# Patient Record
Sex: Male | Born: 1955 | Race: Black or African American | Hispanic: No | Smoking: Former smoker
Health system: Southern US, Community
[De-identification: ages and names within clinical notes are randomized; demographics above are authoritative.]

## PROBLEM LIST (undated history)

## (undated) ENCOUNTER — Emergency Department (HOSPITAL_COMMUNITY): Payer: BC Managed Care – PPO

## (undated) DIAGNOSIS — E785 Hyperlipidemia, unspecified: Secondary | ICD-10-CM

## (undated) DIAGNOSIS — R200 Anesthesia of skin: Secondary | ICD-10-CM

## (undated) DIAGNOSIS — M549 Dorsalgia, unspecified: Secondary | ICD-10-CM

## (undated) DIAGNOSIS — Z8739 Personal history of other diseases of the musculoskeletal system and connective tissue: Secondary | ICD-10-CM

## (undated) DIAGNOSIS — Z8709 Personal history of other diseases of the respiratory system: Secondary | ICD-10-CM

## (undated) DIAGNOSIS — G8929 Other chronic pain: Secondary | ICD-10-CM

## (undated) DIAGNOSIS — J302 Other seasonal allergic rhinitis: Secondary | ICD-10-CM

## (undated) DIAGNOSIS — M2550 Pain in unspecified joint: Secondary | ICD-10-CM

## (undated) HISTORY — PX: HERNIA REPAIR: SHX51

## (undated) HISTORY — PX: NECK SURGERY: SHX720

## (undated) HISTORY — DX: Hyperlipidemia, unspecified: E78.5

---

## 2004-09-03 ENCOUNTER — Emergency Department (HOSPITAL_COMMUNITY): Admission: EM | Admit: 2004-09-03 | Discharge: 2004-09-03 | Payer: Self-pay | Admitting: Emergency Medicine

## 2005-12-25 ENCOUNTER — Ambulatory Visit (HOSPITAL_COMMUNITY): Admission: RE | Admit: 2005-12-25 | Discharge: 2005-12-25 | Payer: Self-pay | Admitting: Family Medicine

## 2008-05-31 ENCOUNTER — Ambulatory Visit (HOSPITAL_COMMUNITY): Admission: RE | Admit: 2008-05-31 | Discharge: 2008-05-31 | Payer: Self-pay | Admitting: Preventative Medicine

## 2009-03-06 ENCOUNTER — Emergency Department (HOSPITAL_COMMUNITY): Admission: EM | Admit: 2009-03-06 | Discharge: 2009-03-06 | Payer: Self-pay | Admitting: Emergency Medicine

## 2010-09-10 ENCOUNTER — Emergency Department (HOSPITAL_COMMUNITY)
Admission: EM | Admit: 2010-09-10 | Discharge: 2010-09-10 | Payer: Self-pay | Source: Home / Self Care | Admitting: Emergency Medicine

## 2011-01-12 LAB — COMPREHENSIVE METABOLIC PANEL
ALT: 17 U/L (ref 0–53)
AST: 19 U/L (ref 0–37)
Calcium: 9.5 mg/dL (ref 8.4–10.5)
Creatinine, Ser: 1.32 mg/dL — ABNORMAL HIGH (ref 0.4–1.5)
GFR calc Af Amer: 51 mL/min — ABNORMAL LOW (ref >60)
GFR calc non Af Amer: 42 mL/min — ABNORMAL LOW (ref >60)
Potassium: 4.1 mEq/L (ref 3.5–5.1)
Sodium: 136 mEq/L (ref 135–145)

## 2011-01-12 LAB — DIFFERENTIAL
Basophils Absolute: 0 10*3/uL (ref 0.0–0.1)
Basophils Relative: 0 % (ref 0–1)
Eosinophils Relative: 1 % (ref 0–5)
Monocytes Absolute: 1.2 10*3/uL — ABNORMAL HIGH (ref 0.1–1.0)

## 2011-01-12 LAB — CBC
MCHC: 33.4 g/dL (ref 30.0–36.0)
Platelets: 241 10*3/uL (ref 150–400)
RDW: 14.8 % (ref 11.5–15.5)
WBC: 11.4 10*3/uL — ABNORMAL HIGH (ref 4.0–10.5)

## 2011-01-12 LAB — LIPASE, BLOOD: Lipase: 23 U/L (ref 11–59)

## 2012-03-21 ENCOUNTER — Emergency Department (HOSPITAL_COMMUNITY): Payer: BC Managed Care – PPO

## 2012-03-21 ENCOUNTER — Emergency Department (HOSPITAL_COMMUNITY)
Admission: EM | Admit: 2012-03-21 | Discharge: 2012-03-21 | Disposition: A | Discharge status: Home / Self Care | Payer: BC Managed Care – PPO | Attending: Emergency Medicine | Admitting: Emergency Medicine

## 2012-03-21 ENCOUNTER — Encounter (HOSPITAL_COMMUNITY): Payer: Self-pay | Admitting: *Deleted

## 2012-03-21 DIAGNOSIS — R109 Unspecified abdominal pain: Secondary | ICD-10-CM

## 2012-03-21 DIAGNOSIS — R112 Nausea with vomiting, unspecified: Secondary | ICD-10-CM | POA: Insufficient documentation

## 2012-03-21 LAB — DIFFERENTIAL
Basophils Absolute: 0 10*3/uL (ref 0.0–0.1)
Lymphs Abs: 2.1 10*3/uL (ref 0.7–4.0)
Neutro Abs: 6.3 10*3/uL (ref 1.7–7.7)
Neutrophils Relative %: 67 % (ref 43–77)

## 2012-03-21 LAB — COMPREHENSIVE METABOLIC PANEL
ALT: 17 U/L (ref 0–53)
AST: 17 U/L (ref 0–37)
CO2: 27 mEq/L (ref 19–32)
Calcium: 9.5 mg/dL (ref 8.4–10.5)
Creatinine, Ser: 1.24 mg/dL (ref 0.50–1.35)
GFR calc Af Amer: 73 mL/min — ABNORMAL LOW (ref >90)
GFR calc non Af Amer: 63 mL/min — ABNORMAL LOW (ref >90)
Potassium: 4.3 mEq/L (ref 3.5–5.1)
Total Protein: 7.8 g/dL (ref 6.0–8.3)

## 2012-03-21 LAB — CBC
HCT: 40.9 % (ref 39.0–52.0)
Hemoglobin: 12.7 g/dL — ABNORMAL LOW (ref 13.0–17.0)
MCH: 25.5 pg — ABNORMAL LOW (ref 26.0–34.0)
MCHC: 31.1 g/dL (ref 30.0–36.0)
MCV: 82.1 fL (ref 78.0–100.0)
Platelets: 318 10*3/uL (ref 150–400)
RDW: 15.4 % (ref 11.5–15.5)
WBC: 9.3 10*3/uL (ref 4.0–10.5)

## 2012-03-21 MED ORDER — FAMOTIDINE IN NACL 20-0.9 MG/50ML-% IV SOLN
20.0000 mg | Freq: Once | INTRAVENOUS | Status: AC
  Administered 2012-03-21: 20 mg via INTRAVENOUS
  Filled 2012-03-21: qty 50

## 2012-03-21 MED ORDER — ONDANSETRON HCL 4 MG/2ML IJ SOLN
4.0000 mg | INTRAMUSCULAR | Status: DC | PRN
  Administered 2012-03-21: 4 mg via INTRAVENOUS
  Filled 2012-03-21: qty 2

## 2012-03-21 MED ORDER — SODIUM CHLORIDE 0.9 % IV SOLN
INTRAVENOUS | Status: DC
  Administered 2012-03-21: 12:00:00 via INTRAVENOUS

## 2012-03-21 MED ORDER — SODIUM CHLORIDE 0.9 % IV BOLUS (SEPSIS)
500.0000 mL | Freq: Once | INTRAVENOUS | Status: AC
  Administered 2012-03-21: 500 mL via INTRAVENOUS

## 2012-03-21 MED ORDER — ONDANSETRON HCL 4 MG PO TABS: 4.0000 mg | ORAL_TABLET | Freq: Three times a day (TID) | ORAL | Status: AC | PRN

## 2012-03-21 NOTE — ED Provider Notes (Signed)
History  This chart was scribed for Laray Anger, DO by Bennett Scrape. This patient was seen in room APA12/APA12 and the patient's care was started at 11:08AM.  CSN: [id number]  Arrival date & time 03/21/12  1054   First MD Initiated Contact with Patient 03/21/12 1108      Chief Complaint  Patient presents with  . Abdominal Pain    The history is provided by the patient. No language interpreter was used.   Jimmy Hall is a 56 y.o. male who presents to the Emergency Department complaining of gradual onset and persistence of constant upper abd "pain" for the past 4 days.  Has been associated with nausea and vomiting.  He states that he originally thought he was constipated and took laxatives yesterday with no improvement. Pt denies sick contacts with similar symptoms. Denies fevers, no rash, no CP/SOB, no back pain, no diarrhea, no black or blood in stools or emesis.    No current PCP.  History reviewed. No pertinent past medical history.  Past Surgical History  Procedure Date  . Hernia repair (umbilical) 1998    History  Substance Use Topics  . Smoking status: Never Smoker   . Smokeless tobacco: Not on file  . Alcohol Use: No    Review of Systems ROS: Statement: All systems negative except as marked or noted in the HPI; Constitutional: Negative for fever and chills. ; ; Eyes: Negative for eye pain, redness and discharge. ; ; ENMT: Negative for ear pain, hoarseness, nasal congestion, sinus pressure and sore throat. ; ; Cardiovascular: Negative for chest pain, palpitations, diaphoresis, dyspnea and peripheral edema. ; ; Respiratory: Negative for cough, wheezing and stridor. ; ; Gastrointestinal: +N/V, upper abd pain. Negative for diarrhea, blood in stool, hematemesis, jaundice and rectal bleeding. . ; ; Genitourinary: Negative for dysuria, flank pain and hematuria. ; ; Musculoskeletal: Negative for back pain and neck pain. Negative for swelling and trauma.; ; Skin: Negative  for pruritus, rash, abrasions, blisters, bruising and skin lesion.; ; Neuro: Negative for headache, lightheadedness and neck stiffness. Negative for weakness, altered level of consciousness , altered mental status, extremity weakness, paresthesias, involuntary movement, seizure and syncope.     Allergies  Review of patient's allergies indicates no known allergies.  Home Medications  No current outpatient prescriptions on file.  Triage Vitals: BP 118/74  Pulse 63  Temp 98.8 F (37.1 C) (Oral)  Resp 18  Ht 5\' 10"  (1.778 m)  Wt 242 lb (109.77 kg)  BMI 34.72 kg/m2  SpO2 100%  Physical Exam 1120: Physical examination:  Nursing notes reviewed; Vital signs and O2 SAT reviewed;  Constitutional: Well developed, Well nourished, In no acute distress; Head:  Normocephalic, atraumatic; Eyes: EOMI, PERRL, No scleral icterus; ENMT: Mouth and pharynx normal, Mucous membranes dry; Neck: Supple, Full range of motion, No lymphadenopathy; Cardiovascular: Regular rate and rhythm, No murmur, rub, or gallop; Respiratory: Breath sounds clear & equal bilaterally, No rales, rhonchi, wheezes.  Speaking full sentences with ease, Normal respiratory effort/excursion; Chest: Nontender, Movement normal; Abdomen: Soft, +TTP RUQ, mid-epigastric, LUQ areas, no rebound or guarding, no palp hernias, +well healed surgical scar proximal to umbilicus is NT to palp without overlying erythema or ecchymosis. Nondistended, Normal bowel sounds; Genitourinary: No CVA tenderness; Extremities: Pulses normal, No tenderness, No edema, No calf edema or asymmetry.; Neuro: AA&Ox3, Major CN grossly intact.  Speech clear. No gross focal motor or sensory deficits in extremities.; Skin: Color normal, Warm, Dry.   ED Course  Procedures  MDM  MDM Reviewed: nursing note and vitals Reviewed previous: ECG Interpretation: labs, x-ray, ultrasound and ECG      Date: 03/21/2012  Rate: 67  Rhythm: normal sinus rhythm  QRS Axis: normal   Intervals: PR prolonged  ST/T Wave abnormalities: normal  Conduction Disutrbances:first-degree A-V block   Narrative Interpretation:   Old EKG Reviewed: none available.  Results for orders placed during the hospital encounter of 03/21/12  CBC      Component Value Range   WBC 9.3  4.0 - 10.5 K/uL   RBC 4.98  4.22 - 5.81 MIL/uL   Hemoglobin 12.7 (*) 13.0 - 17.0 g/dL   HCT 82.9  56.2 - 13.0 %   MCV 82.1  78.0 - 100.0 fL   MCH 25.5 (*) 26.0 - 34.0 pg   MCHC 31.1  30.0 - 36.0 g/dL   RDW 86.5  78.4 - 69.6 %   Platelets 318  150 - 400 K/uL  DIFFERENTIAL      Component Value Range   Neutrophils Relative 67  43 - 77 %   Neutro Abs 6.3  1.7 - 7.7 K/uL   Lymphocytes Relative 22  12 - 46 %   Lymphs Abs 2.1  0.7 - 4.0 K/uL   Monocytes Relative 9  3 - 12 %   Monocytes Absolute 0.9  0.1 - 1.0 K/uL   Eosinophils Relative 1  0 - 5 %   Eosinophils Absolute 0.1  0.0 - 0.7 K/uL   Basophils Relative 0  0 - 1 %   Basophils Absolute 0.0  0.0 - 0.1 K/uL  COMPREHENSIVE METABOLIC PANEL      Component Value Range   Sodium 135  135 - 145 mEq/L   Potassium 4.3  3.5 - 5.1 mEq/L   Chloride 101  96 - 112 mEq/L   CO2 27  19 - 32 mEq/L   Glucose, Bld 107 (*) 70 - 99 mg/dL   BUN 11  6 - 23 mg/dL   Creatinine, Ser 2.95  0.50 - 1.35 mg/dL   Calcium 9.5  8.4 - 28.4 mg/dL   Total Protein 7.8  6.0 - 8.3 g/dL   Albumin 3.6  3.5 - 5.2 g/dL   AST 17  0 - 37 U/L   ALT 17  0 - 53 U/L   Alkaline Phosphatase 60  39 - 117 U/L   Total Bilirubin 0.3  0.3 - 1.2 mg/dL   GFR calc non Af Amer 63 (*) >90 mL/min   GFR calc Af Amer 73 (*) >90 mL/min  LIPASE, BLOOD      Component Value Range   Lipase 25  11 - 59 U/L  TROPONIN I      Component Value Range   Troponin I <0.30  <0.30 ng/mL   US Abdomen Complete 03/21/2012  *RADIOLOGY REPORT*  Clinical Data:  Abdominal pain, nausea and vomiting.  COMPLETE ABDOMINAL ULTRASOUND  Comparison:  CT dated 03/06/2009.  Findings:  Gallbladder:  No gallstones, gallbladder wall  thickening, or pericholecystic fluid.  Common bile duct:  Normal in caliber, measuring 3.8 mm in diameter proximally.  Liver:  No focal lesion identified.  Within normal limits in parenchymal echogenicity.  IVC:  Normal proximally.  Pancreas:  Poorly visualized.  The visualized portions are unremarkable.  Spleen:  Normal, measuring 5.7 cm in length.  Right Kidney:  Normal, measuring 10.2 cm in length.  Left Kidney:  Small cyst.  Otherwise, normal, measuring 10.9 cm in length.  The small left  renal calculus seen on 03/06/2009 is not definitely identified.  Abdominal aorta:  No aneurysm identified.  IMPRESSION: No acute abnormality.  Original Report Authenticated By: Darrol Angel, M.D.   Dg Abd Acute W/chest 03/21/2012  *RADIOLOGY REPORT*  Clinical Data: Abdominal pain, nausea, vomiting, diarrhea  ACUTE ABDOMEN SERIES (ABDOMEN 2 VIEW & CHEST 1 VIEW)  Comparison: 03/06/2009  Findings: Upper-normal size of cardiac silhouette. Mediastinal contours and pulmonary vascularity normal. Lungs clear. Normal bowel gas pattern. Stable left upper quadrant calcification, shown to be a calcified pulmonary granuloma at the posterior left sulcus on a prior CT. No bowel dilatation, bowel wall thickening, or free intraperitoneal air. No acute osseous findings. End plate spur formation lumbar spine.  IMPRESSION: No acute abnormalities.  Original Report Authenticated By: Lollie Marrow, M.D.      3:34 PM:  VS remain stable.  No N/V or stooling while in the ED.  Has tol PO well.  No acute finding on work up today, will tx symptomatically.  Wants to go home now.  Dx testing d/w pt and family.  Questions answered.  Verb understanding, agreeable to d/c home with outpt f/u.      I personally performed the services described in this documentation, which was scribed in my presence. The recorded information has been reviewed and considered. [place] Karl Allison Quarry, DO 03/22/12 1156

## 2012-03-21 NOTE — ED Notes (Signed)
Denies nausea  

## 2012-03-21 NOTE — ED Notes (Signed)
Pt tolerating fluids well.  Denies nausea.

## 2012-03-21 NOTE — ED Notes (Signed)
Given sprite with ice per edp orders.  Sipping with nad.

## 2012-03-21 NOTE — Discharge Instructions (Signed)
RESOURCE GUIDE  Chronic Pain Problems: Contact [facility]  [phone] Patients need to be referred by their primary care doctor.  Insufficient Money for Medicine: Contact United Way:  call "211" or [facility] [phone].  No Primary Care Doctor: - Call [facility]  [phone] - can help you locate a primary care doctor that  accepts your insurance, provides certain services, etc. - Physician Referral Service- 1-800-533-3463  Agencies that provide inexpensive medical care: - [facility]  [phone] - [facility]  [phone] - [facility]  [phone] - [facility] Clinic  [phone] - Planned Parenthood  [phone] - [facility]  [phone]  Medicaid-accepting [place] Providers: - [facility]- [address]  [phone], Mon-Fri 9am-7pm, Sat 9am-1pm - [facility]- [address], [address]  [phone] - [facility]- [address], [address]  [phone] - [facility]- [address] [place] [address]  [phone] - Veita Bland- [address], [address], [phone]  Only accepts [place] Access Medicaid patients after they have their name  applied to their card  Self Pay (no insurance) in [place]: - Sickle Cell Patients: Dr Eric Dean, [facility]  [address], [phone] - [facility]- [address]  [phone]       -     [facility] [place]- [address]       -     [facility]- see information above (Speak to Pam H if you do not have insurance)       -  [facility]- [address], [phone]       -  [facility] [place]- [address],  [phone]       -  [facility]- [address] [place] [address], [phone]       -  Dr Osei-Bonsu-  [address], [place], [phone]       -  [facility]- [address], [phone]       -  [facility]- [address] [place] [address], [phone], also [address], [phone]       -    [facility]- [address], [phone], 1st & 3rd Saturday   every month, 10am-1pm  1) Find a Doctor and Pay Out of Pocket Although you won't have to find out who is covered by your insurance plan, it is a good idea to ask around and get recommendations. You will then need to call the office and see if the doctor you have chosen will accept you as a new patient and what types of options they offer for patients who are self-pay. Some doctors offer discounts or will set up payment plans for their patients who do not have insurance, but you will need to ask so you aren't surprised when you get to your appointment.  2) Contact Your Local Health Department Not all health departments have doctors that can see patients for sick visits, but many do, so it is worth a call to see if yours does. If you don't know where your local health department is, you can check in your phone book. The CDC also has a tool to help you locate your state's health department, and many state websites also have   listings of all of their local health departments.  3) Find a Walk-in Clinic If your illness is not likely to be very severe or complicated, you may want to try a walk in clinic. These are popping up all over the country in pharmacies, drugstores, and shopping centers. They're usually staffed by nurse practitioners or physician assistants that have been trained to treat common illnesses and complaints. They're usually fairly quick and inexpensive. However, if you have serious medical issues or chronic medical problems, these are probably not your best option  STD Testing - [place] [facility] [place], [facility], [address], [place], phone [phone] or [phone].  Monday - Friday, call for an appointment. - [place]  [facility] [place], [facility] Clinic, [address], [place], phone [phone] or 1-877-539-9860.  Monday - Friday, call for an appointment.  Abuse/Neglect: - [place] Child Abuse Hotline (336) 641-3795 - [place] Child Abuse Hotline 800-378-5315 (After Hours)  Emergency Shelter:  [facility] (336) 271-5985  Maternity Homes: - [facility] (336) 275-9566 - [facility] (704) 372-4663  MRSA Hotline #:   832-7006  [place] Resources  [facility] [place]  [employer] [place] [facility] [address]                 [address]         [address]  [place]                                               [place]                              [place] Phone:  [phone]                                  Phone:  [phone]                   Phone:  [phone]  [place] [facility], [phone] - [place] Services - [facility]- 1-888-581-9988       -     [facility] in [place], [address],                                  336-349-4454, Insurance  [place] Child Abuse Hotline (336) 342-1394 or (336) 342-3537 (After Hours)   Behavioral Health Services  Substance Abuse Resources: - [facility]  336-882-2125 - [facility] 336-784-9470 - [facility] 336-285-9073 - [facility] 336-845-3988 - Residential & Outpatient Substance Abuse Program  800-659-3381  Psychological Services: -  Health  [phone] - [facility]  [phone] - [place] [facility], [address], [place], ACCESS LINE: 1-800-853-5163 or 336-641-4981, Http://www.[facility].com/services/adult.htm  Dental Assistance  If unable to pay or uninsured, contact:  [facility] or [place] [facility] to become qualified for the adult dental  clinic.  Patients with Medicaid:  [facility] [address], [phone], [phone]  If unable   to pay, or uninsured, contact [facility] (857) 261-8399) or [facility] (314) [phone] in [place], [phone] in [facility]) to become qualified for the adult dental clinic  Other Low-Cost Community Dental Services: - [facility]- [address] [place], [place], [place], [address], [phone], Ext. [phone], 2nd and 4th Thursday of the month at 6:30am.  10 clients each day by appointment, can sometimes see walk-in patients if someone does not show for an appointment. [facility]- [address] [place], [place], [address], [phone] - [facility]- [address], [place], [place], [address], [phone] [facility]- [phone] [facility] Department- [phone] [facility] [phone]    Eat a bland diet, avoiding greasy, fatty, fried foods, as well as spicy and acidic foods or beverages.  Avoid eating within the hour or 2 before going to bed or laying down.  Also avoid teas, colas, coffee, chocolate, pepermint and spearment.  Take over the counter pepcid, one tablet by mouth twice a day, for the next 2 to 3 weeks.  May also take over the counter maalox/mylanta, as directed on packaging, as needed for discomfort. Increase your fluid intake (ie:  Gatoraide) for the next few days, as discussed.  Eat a bland diet and advance to your regular diet slowly as you can tolerate it.  Call your regular medical doctor today to schedule a follow up appointment this week.  Return to the Emergency Department immediately if not improving (or even worsening) despite taking the medicines as prescribed, any black or bloody stool or vomit, if you develop a fever over "101," or for any other concerns.

## 2012-03-21 NOTE — ED Notes (Signed)
abd pain, Nausea, vomiting , no diarrhea, No fever.  Has felt constipated, took a laxative yesterday.

## 2012-11-08 ENCOUNTER — Ambulatory Visit: Payer: Self-pay

## 2012-11-08 ENCOUNTER — Other Ambulatory Visit: Payer: Self-pay | Admitting: Occupational Medicine

## 2012-11-08 DIAGNOSIS — M79672 Pain in left foot: Secondary | ICD-10-CM

## 2012-11-08 DIAGNOSIS — M25572 Pain in left ankle and joints of left foot: Secondary | ICD-10-CM

## 2012-11-09 ENCOUNTER — Encounter (HOSPITAL_COMMUNITY): Payer: Self-pay | Admitting: Emergency Medicine

## 2012-11-09 ENCOUNTER — Emergency Department (HOSPITAL_COMMUNITY)
Admission: EM | Admit: 2012-11-09 | Discharge: 2012-11-09 | Disposition: A | Discharge status: Home / Self Care | Payer: BC Managed Care – PPO | Attending: Emergency Medicine | Admitting: Emergency Medicine

## 2012-11-09 DIAGNOSIS — M79609 Pain in unspecified limb: Secondary | ICD-10-CM | POA: Insufficient documentation

## 2012-11-09 DIAGNOSIS — M79675 Pain in left toe(s): Secondary | ICD-10-CM

## 2012-11-09 DIAGNOSIS — M2550 Pain in unspecified joint: Secondary | ICD-10-CM | POA: Insufficient documentation

## 2012-11-09 DIAGNOSIS — Z79899 Other long term (current) drug therapy: Secondary | ICD-10-CM | POA: Insufficient documentation

## 2012-11-09 DIAGNOSIS — M109 Gout, unspecified: Secondary | ICD-10-CM | POA: Insufficient documentation

## 2012-11-09 LAB — CBC WITH DIFFERENTIAL/PLATELET
Basophils Relative: 0 % (ref 0–1)
Eosinophils Absolute: 0.2 10*3/uL (ref 0.0–0.7)
Hemoglobin: 12.9 g/dL — ABNORMAL LOW (ref 13.0–17.0)
Lymphs Abs: 1.8 10*3/uL (ref 0.7–4.0)
MCH: 26.4 pg (ref 26.0–34.0)
MCHC: 31.5 g/dL (ref 30.0–36.0)
Neutro Abs: 6.7 10*3/uL (ref 1.7–7.7)
Neutrophils Relative %: 68 % (ref 43–77)
Platelets: 280 10*3/uL (ref 150–400)
RBC: 4.88 MIL/uL (ref 4.22–5.81)

## 2012-11-09 LAB — URIC ACID: Uric Acid, Serum: 5.1 mg/dL (ref 4.0–7.8)

## 2012-11-09 MED ORDER — OXYCODONE-ACETAMINOPHEN 5-325 MG PO TABS: 1.0000 | ORAL_TABLET | ORAL | Status: AC | PRN

## 2012-11-09 MED ORDER — OXYCODONE-ACETAMINOPHEN 5-325 MG PO TABS
1.0000 | ORAL_TABLET | Freq: Once | ORAL | Status: AC
  Administered 2012-11-09: 1 via ORAL
  Filled 2012-11-09: qty 1

## 2012-11-09 MED ORDER — PREDNISONE 10 MG PO TABS: ORAL_TABLET | ORAL | Status: DC

## 2012-11-09 MED ORDER — PREDNISONE 50 MG PO TABS
60.0000 mg | ORAL_TABLET | Freq: Once | ORAL | Status: AC
  Administered 2012-11-09: 60 mg via ORAL
  Filled 2012-11-09: qty 1

## 2012-11-09 NOTE — ED Notes (Signed)
Pt c/o left great toe/foot pain since last week.

## 2012-11-09 NOTE — ED Notes (Addendum)
Pt had rolled left ankle a week ago, had an xray of left ankle done yesterday at Urgent Care, pt denies pain at ankle now but at left foot, swelling and redness noted, possible gout, denies hx of gout

## 2012-11-10 NOTE — ED Provider Notes (Signed)
History     CSN: [id number]  Arrival date & time 11/09/12  2019   First MD Initiated Contact with Patient 11/09/12 2104      Chief Complaint  Patient presents with  . Foot Pain    (Consider location/radiation/quality/duration/timing/severity/associated sxs/prior treatment) HPI Comments: Jimmy Hall is a 57 y.o. Male presenting with pain and swelling at the base of his left great toe since yesterday.  He has a recent injury of sprained ankle to the same foot occurring 1 week ago, seen at an urgent care center with normal xrays.  He has continued swelling around his lateral ankle, but pain is better.  He denies any specific injury to the medial aspect of his foot.  He denies history of gout, but is concerned about this possibility.  He has taken ibuprofen for the ankle injury,  But has taken no medicines since the toe started causing pain.  He denies fevers and chills and there is no radiation of pain in the toe, which is constant and and sharp, throbbing pain.       The history is provided by the patient.    History reviewed. No pertinent past medical history.  Past Surgical History  Procedure Date  . Hernia repair     History reviewed. No pertinent family history.  History  Substance Use Topics  . Smoking status: Never Smoker   . Smokeless tobacco: Not on file  . Alcohol Use: No      Review of Systems  Musculoskeletal: Positive for joint swelling and arthralgias.  Skin: Positive for color change. Negative for wound.  Neurological: Negative for weakness and numbness.    Allergies  Review of patient's allergies indicates no known allergies.  Home Medications   Current Outpatient Rx  Name  Route  Sig  Dispense  Refill  . IBUPROFEN 200 MG PO TABS   Oral   Take 800 mg by mouth 2 (two) times daily as needed. Pain         . ADULT MULTIVITAMIN W/MINERALS CH   Oral   Take 1 tablet by mouth daily.         . OXYCODONE-ACETAMINOPHEN 5-325 MG PO TABS   Oral  Take 1 tablet by mouth every 4 (four) hours as needed for pain.   20 tablet   0   . PREDNISONE 10 MG PO TABS      6, 5, 4, 3, 2 then 1 tablet by mouth daily for 6 days total.   15 tablet   0     BP 141/77  Pulse 88  Temp 98.9 F (37.2 C) (Oral)  Resp 20  Ht 5\' 9"  (1.753 m)  Wt 244 lb (110.678 kg)  BMI 36.03 kg/m2  SpO2 99%  Physical Exam  Constitutional: He appears well-developed and well-nourished.  HENT:  Head: Atraumatic.  Neck: Normal range of motion.  Cardiovascular:       Pulses equal bilaterally  Musculoskeletal: He exhibits tenderness.       Left foot: He exhibits tenderness and swelling. He exhibits normal capillary refill and no deformity.       Feet:       ttp with mild edema and increased warmth and erythema specific to left mtp joint and surrounding skin. Skin is intact with no loss of skin integrity. Dorsalis pedis pulses intact and equal bilateral.  Less than 3 sec cap refill.  No ttp at fibular head,  But there is mild edema laterally.    Neurological:  He is alert. He has normal strength. He displays normal reflexes. No sensory deficit.       Equal strength  Skin: Skin is warm and dry.  Psychiatric: He has a normal mood and affect.    ED Course  Procedures (including critical care time)  Labs Reviewed  CBC WITH DIFFERENTIAL - Abnormal; Notable for the following:    Hemoglobin 12.9 (*)     Monocytes Absolute 1.1 (*)     All other components within normal limits  URIC ACID  LAB REPORT - SCANNED   No results found.   1. Toe pain, left   2. Gout       MDM  xrays from last weeks urgent care visit reviewed and negative.  Not repeated as initial injury appears improved.  Suspect pt does have gout despite normal uric acid level today.  He was prescribed prednisone and oxycodone,  Encouraged heat,  Elevation.  Recheck by ortho - referred to Dr Romeo Apple - pt to call for a recheck appt this week.        Burgess Amor, [place] 11/10/12 2207

## 2012-11-11 NOTE — ED Provider Notes (Signed)
Medical screening examination/treatment/procedure(s) were performed by non-physician practitioner and as supervising physician I was immediately available for consultation/collaboration.   Shelda Jakes, MD 11/11/12 [phone]

## 2013-05-08 ENCOUNTER — Emergency Department (HOSPITAL_COMMUNITY): Payer: BC Managed Care – PPO

## 2013-05-08 ENCOUNTER — Encounter (HOSPITAL_COMMUNITY): Payer: Self-pay | Admitting: Emergency Medicine

## 2013-05-08 ENCOUNTER — Emergency Department (HOSPITAL_COMMUNITY)
Admission: EM | Admit: 2013-05-08 | Discharge: 2013-05-08 | Disposition: A | Discharge status: Home / Self Care | Payer: BC Managed Care – PPO | Attending: Emergency Medicine | Admitting: Emergency Medicine

## 2013-05-08 DIAGNOSIS — R0602 Shortness of breath: Secondary | ICD-10-CM | POA: Insufficient documentation

## 2013-05-08 DIAGNOSIS — M791 Myalgia, unspecified site: Secondary | ICD-10-CM

## 2013-05-08 DIAGNOSIS — R52 Pain, unspecified: Secondary | ICD-10-CM | POA: Insufficient documentation

## 2013-05-08 DIAGNOSIS — R5383 Other fatigue: Secondary | ICD-10-CM | POA: Insufficient documentation

## 2013-05-08 DIAGNOSIS — Z79899 Other long term (current) drug therapy: Secondary | ICD-10-CM | POA: Insufficient documentation

## 2013-05-08 DIAGNOSIS — B349 Viral infection, unspecified: Secondary | ICD-10-CM

## 2013-05-08 DIAGNOSIS — R634 Abnormal weight loss: Secondary | ICD-10-CM | POA: Insufficient documentation

## 2013-05-08 DIAGNOSIS — R42 Dizziness and giddiness: Secondary | ICD-10-CM | POA: Insufficient documentation

## 2013-05-08 DIAGNOSIS — R61 Generalized hyperhidrosis: Secondary | ICD-10-CM | POA: Insufficient documentation

## 2013-05-08 DIAGNOSIS — IMO0001 Reserved for inherently not codable concepts without codable children: Secondary | ICD-10-CM | POA: Insufficient documentation

## 2013-05-08 DIAGNOSIS — B9789 Other viral agents as the cause of diseases classified elsewhere: Secondary | ICD-10-CM | POA: Insufficient documentation

## 2013-05-08 DIAGNOSIS — R5381 Other malaise: Secondary | ICD-10-CM | POA: Insufficient documentation

## 2013-05-08 LAB — COMPREHENSIVE METABOLIC PANEL
ALT: 25 U/L (ref 0–53)
Alkaline Phosphatase: 53 U/L (ref 39–117)
CO2: 22 mEq/L (ref 19–32)
Chloride: 103 mEq/L (ref 96–112)
GFR calc Af Amer: 59 mL/min — ABNORMAL LOW (ref >90)
GFR calc non Af Amer: 51 mL/min — ABNORMAL LOW (ref >90)
Glucose, Bld: 100 mg/dL — ABNORMAL HIGH (ref 70–99)
Potassium: 3.8 mEq/L (ref 3.5–5.1)
Sodium: 134 mEq/L — ABNORMAL LOW (ref 135–145)
Total Bilirubin: 0.2 mg/dL — ABNORMAL LOW (ref 0.3–1.2)
Total Protein: 6.9 g/dL (ref 6.0–8.3)

## 2013-05-08 LAB — CBC WITH DIFFERENTIAL/PLATELET
Hemoglobin: 14.8 g/dL (ref 13.0–17.0)
Lymphocytes Relative: 26 % (ref 12–46)
Lymphs Abs: 1.4 10*3/uL (ref 0.7–4.0)
Monocytes Relative: 9 % (ref 3–12)
Neutrophils Relative %: 64 % (ref 43–77)
Platelets: 172 10*3/uL (ref 150–400)
RBC: 5.23 MIL/uL (ref 4.22–5.81)
WBC: 5.3 10*3/uL (ref 4.0–10.5)

## 2013-05-08 LAB — URINALYSIS, ROUTINE W REFLEX MICROSCOPIC
Bilirubin Urine: NEGATIVE
Ketones, ur: NEGATIVE mg/dL
Specific Gravity, Urine: 1.025 (ref 1.005–1.030)
Urobilinogen, UA: 0.2 mg/dL (ref 0.0–1.0)

## 2013-05-08 LAB — RAPID STREP SCREEN (MED CTR MEBANE ONLY): Streptococcus, Group A Screen (Direct): NEGATIVE

## 2013-05-08 LAB — URINE MICROSCOPIC-ADD ON

## 2013-05-08 LAB — LACTIC ACID, PLASMA: Lactic Acid, Venous: 1.4 mmol/L (ref 0.5–2.2)

## 2013-05-08 MED ORDER — SODIUM CHLORIDE 0.9 % IV BOLUS (SEPSIS)
1000.0000 mL | Freq: Once | INTRAVENOUS | Status: AC
  Administered 2013-05-08: 1000 mL via INTRAVENOUS

## 2013-05-08 MED ORDER — DOXYCYCLINE HYCLATE 100 MG PO CAPS: 100.0000 mg | ORAL_CAPSULE | Freq: Two times a day (BID) | ORAL | Status: DC

## 2013-05-08 MED ORDER — IBUPROFEN 800 MG PO TABS: 800.0000 mg | ORAL_TABLET | Freq: Three times a day (TID) | ORAL | Status: DC

## 2013-05-08 NOTE — ED Provider Notes (Signed)
CSN: [id number]     Arrival date & time 05/08/13  1219 History  This chart was scribed for Jimmy Octave, MD by Bennett Scrape, ED Scribe. This patient was seen in room [facility] and the patient's care was started at 12:35 PM.   First MD Initiated Contact with Patient 05/08/13 1233     Chief Complaint  Patient presents with  . Shortness of Breath  . Fever    The history is provided by the patient. No language interpreter was used.    HPI Comments: Jimmy Hall is a 57 y.o. male who presents to the Emergency Department complaining of one week of SOB with associated dizziness, diaphoresis, chills at night, fatigue and fever. Highest is 101 and last episode was last night. Has tried ibuprofen with no improvement. He was seen at an Urgent Care and told that the symptoms could be a kidney stone. He was then sent to the ED and told that it was most likely from a tick bite. He denies being bitten by a tick. He reports abdominal pain, myalgias and HA yesterday but denies any today. He denies cough, dysuria, rash and hematuria. Pt does not have a h/o chronic medical conditions. He denies being on any daily medications. He reports a 10 to 15 lb weight loss since the onset. Pt denies having any sick contacts with similar symptoms. No recent travels or antibiotic use.  Pt denies having a PCP currently  History reviewed. No pertinent past medical history.  Past Surgical History  Procedure Laterality Date  . Hernia repair     No family history on file. History  Substance Use Topics  . Smoking status: Never Smoker   . Smokeless tobacco: Not on file  . Alcohol Use: No    Review of Systems  A complete 10 system review of systems was obtained and all systems are negative except as noted in the HPI and PMH.   Allergies  Review of patient's allergies indicates no known allergies.  Home Medications   Current Outpatient Rx  Name  Route  Sig  Dispense  Refill  . Ascorbic Acid (VITAMIN C  PO)   Oral   Take 1 tablet by mouth daily.         Marland Kitchen ibuprofen (ADVIL,MOTRIN) 200 MG tablet   Oral   Take 400 mg by mouth every 6 (six) hours as needed for pain. Pain         . doxycycline (VIBRAMYCIN) 100 MG capsule   Oral   Take 1 capsule (100 mg total) by mouth 2 (two) times daily.   20 capsule   0   . ibuprofen (ADVIL,MOTRIN) 800 MG tablet   Oral   Take 1 tablet (800 mg total) by mouth 3 (three) times daily.   21 tablet   0    Triage Vitals: BP 125/79  Pulse 88  Temp(Src) 97.9 F (36.6 C) (Oral)  Resp 32  SpO2 100%  Physical Exam  Nursing note and vitals reviewed. Constitutional: He is oriented to person, place, and time. He appears well-developed and well-nourished. No distress.  HENT:  Head: Normocephalic and atraumatic.  Right Ear: External ear normal.  Left Ear: External ear normal.  Mouth/Throat: Oropharynx is clear and moist. No oropharyngeal exudate.  Eyes: Conjunctivae and EOM are normal.  Neck: Normal range of motion. Neck supple. No tracheal deviation present.  No meningismus   Cardiovascular: Normal rate, regular rhythm and normal heart sounds.   No murmur heard. Pulmonary/Chest: Effort normal  and breath sounds normal. No respiratory distress. He has no wheezes. He has no rales.  Abdominal: Soft. Bowel sounds are normal. There is no tenderness.  Musculoskeletal: Normal range of motion. He exhibits no edema and no tenderness.  Neurological: He is alert and oriented to person, place, and time. No cranial nerve deficit.  5/5 strength throughout, CN 2-12 intact, no ataxia on finger to nose  Skin: Skin is warm and dry. No rash noted.  No rash  Psychiatric: He has a normal mood and affect. His behavior is normal.    ED Course   Procedures (including critical care time)  DIAGNOSTIC STUDIES: Oxygen Saturation is 100% on room air, normal by my interpretation.    COORDINATION OF CARE: 4:05 PM-Discussed treatment plan which includes (CXR, CBC panel,  CMP, UA) with pt at bedside and pt agreed to plan.   Labs Reviewed  COMPREHENSIVE METABOLIC PANEL - Abnormal; Notable for the following:    Sodium 134 (*)    Glucose, Bld 100 (*)    Creatinine, Ser 1.48 (*)    Total Bilirubin 0.2 (*)    GFR calc non Af Amer 51 (*)    GFR calc Af Amer 59 (*)    All other components within normal limits  CK - Abnormal; Notable for the following:    Total CK 337 (*)    All other components within normal limits  URINALYSIS, ROUTINE W REFLEX MICROSCOPIC - Abnormal; Notable for the following:    Hgb urine dipstick TRACE (*)    All other components within normal limits  RAPID STREP SCREEN  CULTURE, GROUP A STREP  CBC WITH DIFFERENTIAL  TROPONIN I  LACTIC ACID, PLASMA  URINE MICROSCOPIC-ADD ON  B. BURGDORFI ANTIBODIES  ROCKY MTN SPOTTED FVR AB, IGG-BLOOD  ROCKY MTN SPOTTED FVR AB, IGM-BLOOD   Dg Chest 2 View  05/08/2013   *RADIOLOGY REPORT*  Clinical Data: Shortness of breath and fever  CHEST - 2 VIEW  Comparison: None.  Findings: Lungs clear.  Heart size and pulmonary vascularity are normal.  No adenopathy.  There is slight anterior wedging of a lower thoracic vertebral body, age uncertain.  IMPRESSION: No edema or consolidation.  Age uncertain mild anterior wedging of a lower thoracic vertebral body.   Original Report Authenticated By: Bretta Bang, M.D.   1. Viral syndrome   2. Myalgia     MDM  1 week history of body aches, fatigue, subjective fever, chills.  Seen at [facility] and referred to outside in ED to r/o kidney stone where he was told his symptoms might be due to a tick bite. Denies any camping, denies seeing any ticks.  Nontoxic appearing, no meningismus. Exam is nonfocal. Lungs are clear.  Chest x-ray is negative. Lyme and RMSF titers sent.  Suspect viral syndrome causing patient's fatigue and myalgias. Needs to establish care with PCP.    Date: 05/08/2013  Rate: 70  Rhythm: normal sinus rhythm  QRS Axis: normal  Intervals: PR  prolonged  ST/T Wave abnormalities: nonspecific ST/T changes  Conduction Disutrbances:first-degree A-V block   Narrative Interpretation:   Old EKG Reviewed: unchanged  BP 125/79  Pulse 88  Temp(Src) 97.9 F (36.6 C) (Oral)  Resp 19  SpO2 100%    I personally performed the services described in this documentation, which was scribed in my presence. The recorded information has been reviewed and is accurate.    Jimmy Octave, MD 05/08/13 1726

## 2013-05-08 NOTE — ED Notes (Signed)
Patient with no complaints at this time. Respirations even and unlabored. Skin warm/dry. Discharge instructions reviewed with patient at this time. Patient given opportunity to voice concerns/ask questions. IV removed per policy and band-aid applied to site. Patient discharged at this time and left Emergency Department with steady gait.  

## 2013-05-08 NOTE — ED Notes (Signed)
Provided Ice chips.

## 2013-05-08 NOTE — ED Notes (Signed)
While ambulating patient stayed at an O2 sat of 98% Patient states he still feels a little light headed.

## 2013-05-08 NOTE — ED Notes (Signed)
The patient states he is having shortness of breath, dizziness, fatigue and a fever since last week.

## 2013-05-09 LAB — ROCKY MTN SPOTTED FVR AB, IGM-BLOOD: RMSF IgM: 0.13 IV (ref 0.00–0.89)

## 2013-05-10 LAB — CULTURE, GROUP A STREP

## 2013-11-14 ENCOUNTER — Ambulatory Visit (INDEPENDENT_AMBULATORY_CARE_PROVIDER_SITE_OTHER): Payer: BC Managed Care – PPO | Admitting: Family Medicine

## 2013-11-14 ENCOUNTER — Ambulatory Visit (INDEPENDENT_AMBULATORY_CARE_PROVIDER_SITE_OTHER): Payer: BC Managed Care – PPO

## 2013-11-14 ENCOUNTER — Encounter: Payer: Self-pay | Admitting: Family Medicine

## 2013-11-14 VITALS — BP 134/84 | HR 84 | Temp 98.5°F | Ht 69.5 in | Wt 245.0 lb

## 2013-11-14 DIAGNOSIS — M129 Arthropathy, unspecified: Secondary | ICD-10-CM

## 2013-11-14 DIAGNOSIS — Z0000 Encounter for general adult medical examination without abnormal findings: Secondary | ICD-10-CM

## 2013-11-14 DIAGNOSIS — M1990 Unspecified osteoarthritis, unspecified site: Secondary | ICD-10-CM

## 2013-11-14 DIAGNOSIS — M25569 Pain in unspecified knee: Secondary | ICD-10-CM

## 2013-11-14 DIAGNOSIS — M549 Dorsalgia, unspecified: Secondary | ICD-10-CM

## 2013-11-14 DIAGNOSIS — M542 Cervicalgia: Secondary | ICD-10-CM

## 2013-11-14 MED ORDER — MELOXICAM 15 MG PO TABS: 15.0000 mg | ORAL_TABLET | Freq: Every day | ORAL | Status: DC

## 2013-11-14 MED ORDER — METHYLPREDNISOLONE (PAK) 4 MG PO TABS: ORAL_TABLET | ORAL | Status: DC

## 2013-11-14 NOTE — Patient Instructions (Signed)

## 2013-11-14 NOTE — Progress Notes (Signed)
   Subjective:    Patient ID: Jimmy Hall, male    DOB: August 13, 1956, 58 y.o.   MRN: [id number]  HPI  This 58 y.o. male presents for evaluation of neck pain and back pain.  He states he went To the chiropractor a few years ago and he had xray and was told he had pinched nerve And now has sciatic nerve disomfort.  He states after 30 minutes of standing he get sciatic Pain after standing.  Patient has not had CPE or labs in awhile. He has been having knee discomfort.  Review of Systems C/o neck and back pain   No chest pain, SOB, HA, dizziness, vision change, N/V, diarrhea, constipation, dysuria, urinary urgency or frequency or rash.  Objective:   Physical Exam Vital signs noted  Well developed well nourished male.  HEENT - Head atraumatic Normocephalic                Eyes - PERRLA, Conjuctiva - clear Sclera- Clear EOMI                Ears - EAC's Wnl TM's Wnl Gross Hearing WNL                Nose - Nares patent                 Throat - oropharanx wnl Respiratory - Lungs CTA bilateral Cardiac - RRR S1 and S2 without murmur GI - Abdomen soft Nontender and bowel sounds active x 4 Extremities - No edema. Neuro - Grossly intact.       Assessment & Plan:  Arthritis - Plan: meloxicam (MOBIC) 15 MG tablet, POCT CBC, Arthritis Panel, CMP14+EGFR, PSA, total and free, Lipid panel, TSH, DG Lumbar Spine 2-3 Views, DG Knee 1-2 Views Left, DG Knee 1-2 Views Right, DG Cervical Spine Complete  Routine general medical examination at a health care facility - Plan: POCT CBC, CMP14+EGFR, PSA, total and free, Lipid panel, TSH  Follow up in one week  Lysbeth Penner FNP

## 2013-11-15 LAB — LIPID PANEL
Chol/HDL Ratio: 4.5 ratio units (ref 0.0–5.0)
Cholesterol, Total: 219 mg/dL — ABNORMAL HIGH (ref 100–199)
HDL: 49 mg/dL (ref >39)
LDL Calculated: 145 mg/dL — ABNORMAL HIGH (ref 0–99)
Triglycerides: 123 mg/dL (ref 0–149)
VLDL Cholesterol Cal: 25 mg/dL (ref 5–40)

## 2013-11-15 LAB — ARTHRITIS PANEL
Anti Nuclear Antibody(ANA): NEGATIVE
Rhuematoid fact SerPl-aCnc: <7 IU/mL (ref 0.0–13.9)
Sed Rate: 22 mm/hr (ref 0–30)
Uric Acid: 8.3 mg/dL (ref 3.7–8.6)

## 2013-11-15 LAB — CMP14+EGFR
ALT: 20 IU/L (ref 0–44)
AST: 15 IU/L (ref 0–40)
Albumin/Globulin Ratio: 1.4 (ref 1.1–2.5)
Albumin: 4.2 g/dL (ref 3.5–5.5)
Alkaline Phosphatase: 64 IU/L (ref 39–117)
BUN/Creatinine Ratio: 14 (ref 9–20)
BUN: 15 mg/dL (ref 6–24)
CO2: 22 mmol/L (ref 18–29)
Calcium: 9.5 mg/dL (ref 8.7–10.2)
Chloride: 101 mmol/L (ref 97–108)
Creatinine, Ser: 1.11 mg/dL (ref 0.76–1.27)
GFR calc Af Amer: 85 mL/min/{1.73_m2} (ref >59)
GFR calc non Af Amer: 73 mL/min/{1.73_m2} (ref >59)
Globulin, Total: 3 g/dL (ref 1.5–4.5)
Glucose: 90 mg/dL (ref 65–99)
Potassium: 4.6 mmol/L (ref 3.5–5.2)
Sodium: 138 mmol/L (ref 134–144)
Total Bilirubin: 0.3 mg/dL (ref 0.0–1.2)
Total Protein: 7.2 g/dL (ref 6.0–8.5)

## 2013-11-15 LAB — PSA, TOTAL AND FREE
PSA, Free Pct: 15.6 %
PSA, Free: 0.14 ng/mL
PSA: 0.9 ng/mL (ref 0.0–4.0)

## 2013-11-15 LAB — TSH: TSH: 1.15 u[IU]/mL (ref 0.450–4.500)

## 2013-11-21 ENCOUNTER — Ambulatory Visit: Payer: BC Managed Care – PPO | Admitting: Family Medicine

## 2013-12-04 ENCOUNTER — Encounter: Payer: Self-pay | Admitting: Family Medicine

## 2013-12-04 ENCOUNTER — Ambulatory Visit (INDEPENDENT_AMBULATORY_CARE_PROVIDER_SITE_OTHER): Payer: BC Managed Care – PPO | Admitting: Family Medicine

## 2013-12-04 VITALS — BP 136/79 | HR 73 | Temp 97.7°F | Ht 69.5 in | Wt 245.0 lb

## 2013-12-04 DIAGNOSIS — M5030 Other cervical disc degeneration, unspecified cervical region: Secondary | ICD-10-CM

## 2013-12-04 NOTE — Progress Notes (Signed)
   Subjective:    Patient ID: Jimmy Hall, male    DOB: 08/19/1956, 58 y.o.   MRN: [id number]  HPI This 58 y.o. male presents for evaluation of numbness and tingling in his hands and arms. His cervical spine xray reveals DDD of the cervical spine.   Review of Systems No chest pain, SOB, HA, dizziness, vision change, N/V, diarrhea, constipation, dysuria, urinary urgency or frequency, myalgias, arthralgias or rash.     Objective:   Physical Exam  Vital signs noted  Well developed well nourished male.  HEENT - Head atraumatic Normocephalic                Eyes - PERRLA, Conjuctiva - clear Sclera- Clear EOMI                Ears - EAC's Wnl TM's Wnl Gross Hearing WNL                Nose - Nares patent                 Throat - oropharanx wnl Respiratory - Lungs CTA bilateral Cardiac - RRR S1 and S2 without murmur GI - Abdomen soft Nontender and bowel sounds active x 4 Extremities - No edema. Neuro - Grossly intact.      Assessment & Plan:  DDD (degenerative disc disease), cervical - Plan: MR Cervical Spine Wo Contrast, Ambulatory referral to Neurosurgery  Deatra CanterWilliam J Juhi Lagrange FNP

## 2013-12-05 ENCOUNTER — Telehealth: Payer: Self-pay | Admitting: Family Medicine

## 2013-12-07 ENCOUNTER — Ambulatory Visit (HOSPITAL_COMMUNITY)
Admission: RE | Admit: 2013-12-07 | Discharge: 2013-12-07 | Disposition: A | Discharge status: Home / Self Care | Payer: BC Managed Care – PPO | Source: Ambulatory Visit | Attending: Family Medicine | Admitting: Family Medicine

## 2013-12-07 DIAGNOSIS — M47812 Spondylosis without myelopathy or radiculopathy, cervical region: Secondary | ICD-10-CM | POA: Insufficient documentation

## 2013-12-07 DIAGNOSIS — M5030 Other cervical disc degeneration, unspecified cervical region: Secondary | ICD-10-CM | POA: Insufficient documentation

## 2013-12-08 ENCOUNTER — Other Ambulatory Visit: Payer: Self-pay | Admitting: Family Medicine

## 2013-12-08 DIAGNOSIS — M4802 Spinal stenosis, cervical region: Secondary | ICD-10-CM

## 2013-12-12 ENCOUNTER — Telehealth: Payer: Self-pay | Admitting: Family Medicine

## 2013-12-13 NOTE — Telephone Encounter (Signed)
Discussed results and pending neurosurgery referral.

## 2013-12-27 ENCOUNTER — Other Ambulatory Visit: Payer: Self-pay | Admitting: Neurosurgery

## 2013-12-28 ENCOUNTER — Encounter (HOSPITAL_COMMUNITY): Payer: Self-pay | Admitting: *Deleted

## 2013-12-28 ENCOUNTER — Encounter (HOSPITAL_COMMUNITY): Payer: Self-pay | Admitting: Pharmacy Technician

## 2013-12-28 MED ORDER — MUPIROCIN 2 % EX OINT
TOPICAL_OINTMENT | Freq: Two times a day (BID) | CUTANEOUS | Status: DC
  Administered 2013-12-29 – 2013-12-31 (×5): via NASAL
  Filled 2013-12-28 (×2): qty 22

## 2013-12-28 NOTE — Progress Notes (Signed)
12/28/13 1752  OBSTRUCTIVE SLEEP APNEA  Have you ever been diagnosed with sleep apnea through a sleep study? No  Do you snore loudly (loud enough to be heard through closed doors)?  1  Do you often feel tired, fatigued, or sleepy during the daytime? 0  Has anyone observed you stop breathing during your sleep? 0  Do you have, or are you being treated for high blood pressure? 0  BMI more than 35 kg/m2? 1  Age over 58 years old? 1  Neck circumference greater than 40 cm/18 inches? 0  Gender: 1  Obstructive Sleep Apnea Score 4  Score 4 or greater  Results sent to PCP

## 2013-12-28 NOTE — H&P (Signed)
Jimmy Hall is an 58 y.o. male.   Chief Complaint: neck and back pain HPI: patient who came to my office with the complain of neck pain with burning sensation going to the left arm and hand involving the fingers. Also lumbar pain with radiation to both legs which gets better with flexion. He had an outpatien myelogram and later on i met with him , wife and kids.  Past Medical History  Diagnosis Date  . Allergy   . Cervical stenosis of spine     with Myelopathy  . Chronic back pain     Past Surgical History  Procedure Laterality Date  . Hernia repair      Family History  Problem Relation Age of Onset  . Stroke Mother    Social History:  reports that he quit smoking about 24 years ago. His smoking use included Cigarettes. He started smoking about 39 years ago. He smoked 0.50 packs per day. He has never used smokeless tobacco. He reports that he drinks alcohol. He reports that he does not use illicit drugs.  Allergies: No Known Allergies  No prescriptions prior to admission    No results found for this or any previous visit (from the past 48 hour(s)). No results found.  Review of Systems  Constitutional: Negative.   Eyes: Negative.   Respiratory: Negative.   Cardiovascular: Negative.   Gastrointestinal: Negative.   Genitourinary: Negative.   Musculoskeletal: Positive for back pain and neck pain.  Skin: Negative.   Neurological: Positive for headaches.  Endo/Heme/Allergies: Negative.   Psychiatric/Behavioral: Negative.     There were no vitals taken for this visit. Physical Exam hent, nl. Neck, pain with lateral movement. Cv, nl. Lungs, clear. Abdomen , nl. Extremities, nl. NEURO, no weakness in upper or  Lower extremities. Femoral stretch maneuver positive bilaterally. Thew cervical spine shows stenosis from cervical 3 to 6. 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 He is aware of risks and nenefits. Later on we will approach the lumbar area. He declined a second opinion  Peyten Punches M 12/28/2013, 8:28 PM

## 2013-12-29 ENCOUNTER — Encounter (HOSPITAL_COMMUNITY): Payer: Self-pay | Admitting: *Deleted

## 2013-12-29 ENCOUNTER — Encounter (HOSPITAL_COMMUNITY)
Admission: RE | Disposition: A | Discharge status: Home / Self Care | Payer: Self-pay | Source: Ambulatory Visit | Attending: Neurosurgery

## 2013-12-29 ENCOUNTER — Ambulatory Visit (HOSPITAL_COMMUNITY): Payer: BC Managed Care – PPO

## 2013-12-29 ENCOUNTER — Ambulatory Visit (HOSPITAL_COMMUNITY): Payer: BC Managed Care – PPO | Admitting: Anesthesiology

## 2013-12-29 ENCOUNTER — Inpatient Hospital Stay (HOSPITAL_COMMUNITY)
Admission: RE | Admit: 2013-12-29 | Discharge: 2013-12-31 | DRG: 473 | Disposition: A | Discharge status: Home / Self Care | Payer: BC Managed Care – PPO | Source: Ambulatory Visit | Attending: Neurosurgery | Admitting: Neurosurgery

## 2013-12-29 ENCOUNTER — Encounter (HOSPITAL_COMMUNITY): Payer: BC Managed Care – PPO | Admitting: Anesthesiology

## 2013-12-29 DIAGNOSIS — Z87891 Personal history of nicotine dependence: Secondary | ICD-10-CM

## 2013-12-29 DIAGNOSIS — M4802 Spinal stenosis, cervical region: Principal | ICD-10-CM | POA: Diagnosis present

## 2013-12-29 HISTORY — DX: Dorsalgia, unspecified: M54.9

## 2013-12-29 HISTORY — DX: Other chronic pain: G89.29

## 2013-12-29 HISTORY — PX: ANTERIOR CERVICAL DECOMP/DISCECTOMY FUSION: SHX1161

## 2013-12-29 LAB — BASIC METABOLIC PANEL
BUN: 18 mg/dL (ref 6–23)
BUN: 18 mg/dL (ref 6–23)
CALCIUM: 9.2 mg/dL (ref 8.4–10.5)
CHLORIDE: 100 meq/L (ref 96–112)
CHLORIDE: 102 meq/L (ref 96–112)
CO2: 21 meq/L (ref 19–32)
CO2: 25 meq/L (ref 19–32)
CREATININE: 1.19 mg/dL (ref 0.50–1.35)
CREATININE: 1.38 mg/dL — AB (ref 0.50–1.35)
Calcium: 9.3 mg/dL (ref 8.4–10.5)
GFR calc Af Amer: 77 mL/min — ABNORMAL LOW (ref >90)
GFR calc Af Amer: 64 mL/min — ABNORMAL LOW (ref >90)
GFR calc non Af Amer: 66 mL/min — ABNORMAL LOW (ref >90)
GFR calc non Af Amer: 55 mL/min — ABNORMAL LOW (ref >90)
GLUCOSE: 94 mg/dL (ref 70–99)
GLUCOSE: 93 mg/dL (ref 70–99)
Potassium: 5.4 mEq/L — ABNORMAL HIGH (ref 3.7–5.3)
Sodium: 135 mEq/L — ABNORMAL LOW (ref 137–147)
Sodium: 137 mEq/L (ref 137–147)

## 2013-12-29 LAB — CBC
HEMATOCRIT: 48.3 % (ref 39.0–52.0)
Hemoglobin: 16.2 g/dL (ref 13.0–17.0)
MCH: 29.1 pg (ref 26.0–34.0)
MCHC: 33.5 g/dL (ref 30.0–36.0)
MCV: 86.9 fL (ref 78.0–100.0)
Platelets: 226 10*3/uL (ref 150–400)
RBC: 5.56 MIL/uL (ref 4.22–5.81)
RDW: 14 % (ref 11.5–15.5)
WBC: 12.5 10*3/uL — AB (ref 4.0–10.5)

## 2013-12-29 LAB — SURGICAL PCR SCREEN
MRSA, PCR: NEGATIVE
STAPHYLOCOCCUS AUREUS: NEGATIVE

## 2013-12-29 SURGERY — ANTERIOR CERVICAL DECOMPRESSION/DISCECTOMY FUSION 3 LEVELS
Anesthesia: General | Site: Neck

## 2013-12-29 MED ORDER — LACTATED RINGERS IV SOLN
INTRAVENOUS | Status: DC
  Administered 2013-12-29: 11:00:00 via INTRAVENOUS

## 2013-12-29 MED ORDER — ONDANSETRON HCL 4 MG/2ML IJ SOLN
INTRAMUSCULAR | Status: DC | PRN
Start: 2013-12-29 — End: 2013-12-29
  Administered 2013-12-29: 4 mg via INTRAVENOUS

## 2013-12-29 MED ORDER — ARTIFICIAL TEARS OP OINT
TOPICAL_OINTMENT | OPHTHALMIC | Status: DC | PRN
  Administered 2013-12-29: 1 via OPHTHALMIC

## 2013-12-29 MED ORDER — FENTANYL CITRATE 0.05 MG/ML IJ SOLN
INTRAMUSCULAR | Status: AC
  Filled 2013-12-29: qty 5

## 2013-12-29 MED ORDER — HYDROMORPHONE HCL PF 1 MG/ML IJ SOLN
INTRAMUSCULAR | Status: AC
  Filled 2013-12-29: qty 1

## 2013-12-29 MED ORDER — SODIUM CHLORIDE 0.9 % IV SOLN
INTRAVENOUS | Status: DC
  Administered 2013-12-29 – 2013-12-30 (×2): via INTRAVENOUS

## 2013-12-29 MED ORDER — EPHEDRINE SULFATE 50 MG/ML IJ SOLN
INTRAMUSCULAR | Status: DC | PRN
  Administered 2013-12-29 (×2): 10 mg via INTRAVENOUS

## 2013-12-29 MED ORDER — ARTIFICIAL TEARS OP OINT
TOPICAL_OINTMENT | OPHTHALMIC | Status: AC
  Filled 2013-12-29: qty 3.5

## 2013-12-29 MED ORDER — SODIUM CHLORIDE 0.9 % IV SOLN: 250.0000 mL | INTRAVENOUS | Status: DC

## 2013-12-29 MED ORDER — MENTHOL 3 MG MT LOZG: 1.0000 | LOZENGE | OROMUCOSAL | Status: DC | PRN

## 2013-12-29 MED ORDER — PHENYLEPHRINE HCL 10 MG/ML IJ SOLN
INTRAMUSCULAR | Status: DC | PRN
  Administered 2013-12-29 (×4): 40 ug via INTRAVENOUS

## 2013-12-29 MED ORDER — THROMBIN 5000 UNITS EX SOLR
OROMUCOSAL | Status: DC | PRN
  Administered 2013-12-29: 17:00:00 via TOPICAL

## 2013-12-29 MED ORDER — NEOSTIGMINE METHYLSULFATE 1 MG/ML IJ SOLN
INTRAMUSCULAR | Status: AC
  Filled 2013-12-29: qty 10

## 2013-12-29 MED ORDER — ZOLPIDEM TARTRATE 5 MG PO TABS: 5.0000 mg | ORAL_TABLET | Freq: Every evening | ORAL | Status: DC | PRN

## 2013-12-29 MED ORDER — PROPOFOL 10 MG/ML IV BOLUS
INTRAVENOUS | Status: AC
  Filled 2013-12-29: qty 20

## 2013-12-29 MED ORDER — ACETAMINOPHEN 650 MG RE SUPP: 650.0000 mg | RECTAL | Status: DC | PRN

## 2013-12-29 MED ORDER — NEOSTIGMINE METHYLSULFATE 1 MG/ML IJ SOLN
INTRAMUSCULAR | Status: DC | PRN
  Administered 2013-12-29: 4 mg via INTRAVENOUS

## 2013-12-29 MED ORDER — DIAZEPAM 5 MG PO TABS
5.0000 mg | ORAL_TABLET | Freq: Four times a day (QID) | ORAL | Status: DC | PRN
  Administered 2013-12-29 – 2013-12-30 (×2): 5 mg via ORAL
  Filled 2013-12-29: qty 1

## 2013-12-29 MED ORDER — DEXAMETHASONE SODIUM PHOSPHATE 10 MG/ML IJ SOLN
INTRAMUSCULAR | Status: DC | PRN
  Administered 2013-12-29: 10 mg via INTRAVENOUS

## 2013-12-29 MED ORDER — PROPOFOL 10 MG/ML IV BOLUS
INTRAVENOUS | Status: DC | PRN
  Administered 2013-12-29: 200 mg via INTRAVENOUS

## 2013-12-29 MED ORDER — 0.9 % SODIUM CHLORIDE (POUR BTL) OPTIME
TOPICAL | Status: DC | PRN
  Administered 2013-12-29: 1000 mL

## 2013-12-29 MED ORDER — DEXAMETHASONE SODIUM PHOSPHATE 4 MG/ML IJ SOLN
INTRAMUSCULAR | Status: AC
  Filled 2013-12-29: qty 1

## 2013-12-29 MED ORDER — FENTANYL CITRATE 0.05 MG/ML IJ SOLN
INTRAMUSCULAR | Status: DC | PRN
  Administered 2013-12-29 (×2): 50 ug via INTRAVENOUS
  Administered 2013-12-29: 150 ug via INTRAVENOUS
  Administered 2013-12-29 (×3): 50 ug via INTRAVENOUS
  Administered 2013-12-29: 100 ug via INTRAVENOUS
  Administered 2013-12-29 (×2): 50 ug via INTRAVENOUS

## 2013-12-29 MED ORDER — ROCURONIUM BROMIDE 50 MG/5ML IV SOLN
INTRAVENOUS | Status: AC
  Filled 2013-12-29: qty 1

## 2013-12-29 MED ORDER — PHENOL 1.4 % MT LIQD: 1.0000 | OROMUCOSAL | Status: DC | PRN

## 2013-12-29 MED ORDER — SODIUM CHLORIDE 0.9 % IJ SOLN: 3.0000 mL | INTRAMUSCULAR | Status: DC | PRN

## 2013-12-29 MED ORDER — DEXAMETHASONE SODIUM PHOSPHATE 4 MG/ML IJ SOLN
4.0000 mg | Freq: Four times a day (QID) | INTRAMUSCULAR | Status: DC
Start: 2013-12-30 — End: 2013-12-31
  Administered 2013-12-29 – 2013-12-30 (×2): 4 mg via INTRAVENOUS
  Filled 2013-12-29 (×10): qty 1

## 2013-12-29 MED ORDER — CEFAZOLIN SODIUM-DEXTROSE 2-3 GM-% IV SOLR
INTRAVENOUS | Status: AC
  Administered 2013-12-29: 2 g via INTRAVENOUS
  Filled 2013-12-29: qty 50

## 2013-12-29 MED ORDER — LIDOCAINE HCL 4 % MT SOLN
OROMUCOSAL | Status: DC | PRN
  Administered 2013-12-29: 4 mL via TOPICAL

## 2013-12-29 MED ORDER — PHENYLEPHRINE HCL 10 MG/ML IJ SOLN
INTRAMUSCULAR | Status: AC
  Filled 2013-12-29: qty 1

## 2013-12-29 MED ORDER — DEXAMETHASONE 4 MG PO TABS
4.0000 mg | ORAL_TABLET | Freq: Four times a day (QID) | ORAL | Status: DC
  Administered 2013-12-30 – 2013-12-31 (×5): 4 mg via ORAL
  Filled 2013-12-29 (×10): qty 1

## 2013-12-29 MED ORDER — GLYCOPYRROLATE 0.2 MG/ML IJ SOLN
INTRAMUSCULAR | Status: DC | PRN
  Administered 2013-12-29: .6 mg via INTRAVENOUS

## 2013-12-29 MED ORDER — HYDROMORPHONE HCL PF 1 MG/ML IJ SOLN
0.2500 mg | INTRAMUSCULAR | Status: DC | PRN
  Administered 2013-12-29 (×3): 0.5 mg via INTRAVENOUS

## 2013-12-29 MED ORDER — ACETAMINOPHEN 325 MG PO TABS: 650.0000 mg | ORAL_TABLET | ORAL | Status: DC | PRN

## 2013-12-29 MED ORDER — MORPHINE SULFATE 2 MG/ML IJ SOLN
1.0000 mg | INTRAMUSCULAR | Status: DC | PRN
  Administered 2013-12-29 – 2013-12-30 (×2): 2 mg via INTRAVENOUS
  Filled 2013-12-29 (×2): qty 1

## 2013-12-29 MED ORDER — SUCCINYLCHOLINE CHLORIDE 20 MG/ML IJ SOLN
INTRAMUSCULAR | Status: AC
  Filled 2013-12-29: qty 1

## 2013-12-29 MED ORDER — ONDANSETRON HCL 4 MG/2ML IJ SOLN: 4.0000 mg | INTRAMUSCULAR | Status: DC | PRN

## 2013-12-29 MED ORDER — LIDOCAINE HCL (CARDIAC) 20 MG/ML IV SOLN
INTRAVENOUS | Status: DC | PRN
  Administered 2013-12-29: 100 mg via INTRAVENOUS

## 2013-12-29 MED ORDER — DEXAMETHASONE SODIUM PHOSPHATE 4 MG/ML IJ SOLN
INTRAMUSCULAR | Status: AC
  Filled 2013-12-29: qty 2

## 2013-12-29 MED ORDER — ONDANSETRON HCL 4 MG/2ML IJ SOLN: 4.0000 mg | Freq: Once | INTRAMUSCULAR | Status: DC | PRN

## 2013-12-29 MED ORDER — SODIUM CHLORIDE 0.9 % IJ SOLN
3.0000 mL | Freq: Two times a day (BID) | INTRAMUSCULAR | Status: DC
  Administered 2013-12-30 – 2013-12-31 (×2): 3 mL via INTRAVENOUS

## 2013-12-29 MED ORDER — CEFAZOLIN SODIUM 1-5 GM-% IV SOLN
1.0000 g | Freq: Three times a day (TID) | INTRAVENOUS | Status: AC
  Administered 2013-12-29 – 2013-12-30 (×2): 1 g via INTRAVENOUS
  Filled 2013-12-29 (×2): qty 50

## 2013-12-29 MED ORDER — DIAZEPAM 5 MG PO TABS
ORAL_TABLET | ORAL | Status: AC
  Filled 2013-12-29: qty 1

## 2013-12-29 MED ORDER — OXYCODONE-ACETAMINOPHEN 5-325 MG PO TABS
1.0000 | ORAL_TABLET | ORAL | Status: DC | PRN
  Administered 2013-12-30 – 2013-12-31 (×5): 2 via ORAL
  Filled 2013-12-29 (×6): qty 2

## 2013-12-29 MED ORDER — THROMBIN 20000 UNITS EX SOLR
CUTANEOUS | Status: DC | PRN
  Administered 2013-12-29: 17:00:00 via TOPICAL

## 2013-12-29 MED ORDER — LACTATED RINGERS IV SOLN
INTRAVENOUS | Status: DC | PRN
  Administered 2013-12-29 (×3): via INTRAVENOUS

## 2013-12-29 MED ORDER — ROCURONIUM BROMIDE 100 MG/10ML IV SOLN
INTRAVENOUS | Status: DC | PRN
  Administered 2013-12-29 (×5): 10 mg via INTRAVENOUS
  Administered 2013-12-29: 50 mg via INTRAVENOUS

## 2013-12-29 SURGICAL SUPPLY — 83 items
BANDAGE GAUZE ELAST BULKY 4 IN (GAUZE/BANDAGES/DRESSINGS) ×6 IMPLANT
BENZOIN TINCTURE PRP APPL 2/3 (GAUZE/BANDAGES/DRESSINGS) ×3 IMPLANT
BIT DRILL SM SPINE QC 14 (BIT) ×3 IMPLANT
BLADE ULTRA TIP 2M (BLADE) ×3 IMPLANT
BUR BARREL STRAIGHT FLUTE 4.0 (BURR) IMPLANT
BUR MATCHSTICK NEURO 3.0 LAGG (BURR) ×3 IMPLANT
CANISTER SUCT 3000ML (MISCELLANEOUS) ×3 IMPLANT
CLOSURE WOUND 1/2 X4 (GAUZE/BANDAGES/DRESSINGS) ×1
CONT SPEC 4OZ CLIKSEAL STRL BL (MISCELLANEOUS) ×3 IMPLANT
COVER MAYO STAND STRL (DRAPES) ×3 IMPLANT
DRAIN JACKSON PRATT 10MM FLAT (MISCELLANEOUS) ×3 IMPLANT
DRAPE C-ARM 42X72 X-RAY (DRAPES) ×6 IMPLANT
DRAPE LAPAROTOMY 100X72 PEDS (DRAPES) ×3 IMPLANT
DRAPE MICROSCOPE LEICA (MISCELLANEOUS) ×3 IMPLANT
DRAPE POUCH INSTRU U-SHP 10X18 (DRAPES) ×3 IMPLANT
DRAPE PROXIMA HALF (DRAPES) IMPLANT
DURAPREP 6ML APPLICATOR 50/CS (WOUND CARE) ×3 IMPLANT
ELECT REM PT RETURN 9FT ADLT (ELECTROSURGICAL) ×3
ELECTRODE REM PT RTRN 9FT ADLT (ELECTROSURGICAL) ×1 IMPLANT
EVACUATOR SILICONE 100CC (DRAIN) ×3 IMPLANT
GAUZE SPONGE 4X4 16PLY XRAY LF (GAUZE/BANDAGES/DRESSINGS) IMPLANT
GLOVE BIO SURGEON STRL SZ 6.5 (GLOVE) ×4 IMPLANT
GLOVE BIO SURGEON STRL SZ7 (GLOVE) IMPLANT
GLOVE BIO SURGEON STRL SZ7.5 (GLOVE) IMPLANT
GLOVE BIO SURGEON STRL SZ8 (GLOVE) IMPLANT
GLOVE BIO SURGEON STRL SZ8.5 (GLOVE) IMPLANT
GLOVE BIO SURGEONS STRL SZ 6.5 (GLOVE) ×2
GLOVE BIOGEL M 8.0 STRL (GLOVE) ×3 IMPLANT
GLOVE BIOGEL PI IND STRL 7.0 (GLOVE) ×2 IMPLANT
GLOVE BIOGEL PI IND STRL 7.5 (GLOVE) ×1 IMPLANT
GLOVE BIOGEL PI IND STRL 8 (GLOVE) ×1 IMPLANT
GLOVE BIOGEL PI INDICATOR 7.0 (GLOVE) ×4
GLOVE BIOGEL PI INDICATOR 7.5 (GLOVE) ×2
GLOVE BIOGEL PI INDICATOR 8 (GLOVE) ×2
GLOVE ECLIPSE 6.5 STRL STRAW (GLOVE) IMPLANT
GLOVE ECLIPSE 7.0 STRL STRAW (GLOVE) ×3 IMPLANT
GLOVE ECLIPSE 7.5 STRL STRAW (GLOVE) ×3 IMPLANT
GLOVE ECLIPSE 8.0 STRL XLNG CF (GLOVE) IMPLANT
GLOVE ECLIPSE 8.5 STRL (GLOVE) IMPLANT
GLOVE EXAM NITRILE LRG STRL (GLOVE) IMPLANT
GLOVE EXAM NITRILE MD LF STRL (GLOVE) IMPLANT
GLOVE EXAM NITRILE XL STR (GLOVE) IMPLANT
GLOVE EXAM NITRILE XS STR PU (GLOVE) IMPLANT
GLOVE INDICATOR 6.5 STRL GRN (GLOVE) IMPLANT
GLOVE INDICATOR 7.0 STRL GRN (GLOVE) IMPLANT
GLOVE INDICATOR 7.5 STRL GRN (GLOVE) IMPLANT
GLOVE INDICATOR 8.0 STRL GRN (GLOVE) IMPLANT
GLOVE INDICATOR 8.5 STRL (GLOVE) IMPLANT
GLOVE OPTIFIT SS 8.0 STRL (GLOVE) IMPLANT
GLOVE SURG SS PI 6.5 STRL IVOR (GLOVE) IMPLANT
GLOVE SURG SS PI 7.0 STRL IVOR (GLOVE) ×9 IMPLANT
GOWN BRE IMP SLV AUR LG STRL (GOWN DISPOSABLE) IMPLANT
GOWN BRE IMP SLV AUR XL STRL (GOWN DISPOSABLE) IMPLANT
GOWN STRL REIN 2XL LVL4 (GOWN DISPOSABLE) IMPLANT
GOWN STRL REUS W/ TWL LRG LVL3 (GOWN DISPOSABLE) ×3 IMPLANT
GOWN STRL REUS W/ TWL XL LVL3 (GOWN DISPOSABLE) ×1 IMPLANT
GOWN STRL REUS W/TWL LRG LVL3 (GOWN DISPOSABLE) ×6
GOWN STRL REUS W/TWL XL LVL3 (GOWN DISPOSABLE) ×2
HALTER HD/CHIN CERV TRACTION D (MISCELLANEOUS) ×3 IMPLANT
HEMOSTAT POWDER KIT SURGIFOAM (HEMOSTASIS) ×3 IMPLANT
KIT BASIN OR (CUSTOM PROCEDURE TRAY) ×3 IMPLANT
KIT ROOM TURNOVER OR (KITS) ×3 IMPLANT
NEEDLE SPNL 22GX3.5 QUINCKE BK (NEEDLE) ×3 IMPLANT
NS IRRIG 1000ML POUR BTL (IV SOLUTION) ×3 IMPLANT
PACK LAMINECTOMY NEURO (CUSTOM PROCEDURE TRAY) ×3 IMPLANT
PATTIES SURGICAL .5 X1 (DISPOSABLE) ×3 IMPLANT
PLATE ANT CERV XTEND 3 LV 51 (Plate) ×3 IMPLANT
PUTTY DBX 1CC (Putty) ×3 IMPLANT
PUTTY DBX 1CC DEPUY (Putty) ×1 IMPLANT
RUBBERBAND STERILE (MISCELLANEOUS) ×6 IMPLANT
SCREW XTD VAR 4.2 SELF TAP (Screw) ×24 IMPLANT
SPACER ACDF SM PARALLEL 7 (Spacer) ×6 IMPLANT
SPACER COLONIAL SZ 6-7 (Spacer) ×3 IMPLANT
SPONGE GAUZE 4X4 12PLY (GAUZE/BANDAGES/DRESSINGS) ×3 IMPLANT
SPONGE INTESTINAL PEANUT (DISPOSABLE) ×3 IMPLANT
SPONGE SURGIFOAM ABS GEL 100 (HEMOSTASIS) ×3 IMPLANT
STRIP CLOSURE SKIN 1/2X4 (GAUZE/BANDAGES/DRESSINGS) ×2 IMPLANT
SUT VIC AB 3-0 SH 8-18 (SUTURE) ×6 IMPLANT
SYR 20ML ECCENTRIC (SYRINGE) ×3 IMPLANT
TAPE CLOTH SURG 4X10 WHT LF (GAUZE/BANDAGES/DRESSINGS) ×3 IMPLANT
TOWEL OR 17X24 6PK STRL BLUE (TOWEL DISPOSABLE) ×3 IMPLANT
TOWEL OR 17X26 10 PK STRL BLUE (TOWEL DISPOSABLE) ×3 IMPLANT
WATER STERILE IRR 1000ML POUR (IV SOLUTION) ×3 IMPLANT

## 2013-12-29 NOTE — Progress Notes (Signed)
Orthopedic Tech Progress Note Patient Details:  Quintella ReichertJoe L Patella 07/13/1956 [id number]  Ortho Devices Type of Ortho Device: Soft collar Ortho Device/Splint Location: neck Ortho Device/Splint Interventions: Ordered;Application   Jennye MoccasinHughes, Evans Levee Craig 12/29/2013, 8:54 PM

## 2013-12-29 NOTE — Preoperative (Signed)
Beta Blockers   Reason not to administer Beta Blockers:Not Applicable 

## 2013-12-29 NOTE — Progress Notes (Signed)
Per Neuro OR patient delayed d/t previous case will notify patient of delay.

## 2013-12-29 NOTE — Anesthesia Procedure Notes (Signed)
Procedure Name: Intubation Date/Time: 12/29/2013 4:09 PM Performed by: Edmonia CaprioAUSTON, Ginnette Gates M Pre-anesthesia Checklist: Patient identified, Emergency Drugs available, Suction available, Patient being monitored and Timeout performed Patient Re-evaluated:Patient Re-evaluated prior to inductionOxygen Delivery Method: Circle system utilized Preoxygenation: Pre-oxygenation with 100% oxygen Intubation Type: IV induction Ventilation: Mask ventilation without difficulty Laryngoscope Size: Miller and 2 Grade View: Grade I Tube type: Oral Tube size: 7.5 mm Number of attempts: 1 Placement Confirmation: ETT inserted through vocal cords under direct vision,  positive ETCO2,  CO2 detector and breath sounds checked- equal and bilateral Secured at: 23 cm Tube secured with: Tape Dental Injury: Teeth and Oropharynx as per pre-operative assessment

## 2013-12-29 NOTE — Progress Notes (Signed)
Op note [phone]

## 2013-12-29 NOTE — Anesthesia Preprocedure Evaluation (Signed)
Anesthesia Evaluation  Patient identified by MRN, date of birth, ID band Patient awake    Reviewed: Allergy & Precautions, H&P , NPO status , Patient's Chart, lab work & pertinent test results  Airway       Dental   Pulmonary former smoker,          Cardiovascular     Neuro/Psych    GI/Hepatic   Endo/Other    Renal/GU      Musculoskeletal   Abdominal   Peds  Hematology   Anesthesia Other Findings   Reproductive/Obstetrics                           Anesthesia Physical Anesthesia Plan  ASA: I  Anesthesia Plan: General   Post-op Pain Management:    Induction: Intravenous  Airway Management Planned: Oral ETT  Additional Equipment:   Intra-op Plan:   Post-operative Plan: Extubation in OR  Informed Consent: I have reviewed the patients History and Physical, chart, labs and discussed the procedure including the risks, benefits and alternatives for the proposed anesthesia with the patient or authorized representative who has indicated his/her understanding and acceptance.     Plan Discussed with:   Anesthesia Plan Comments:         Anesthesia Quick Evaluation  

## 2013-12-29 NOTE — Progress Notes (Signed)
Dr. Katrinka BlazingSmith notified that first BMET drawn off of IV with hemolysis. Sample redrawn by lab.

## 2013-12-29 NOTE — Transfer of Care (Signed)
Immediate Anesthesia Transfer of Care Note  Patient: Obinna L Bailly  Procedure(s) Performed: Procedure(s) with comments: ANTERIOR CERVICAL DECOMPRESSION/DISCECTOMY FUSION 3 LEVELS (N/A) - Anterior Cervical Three-Four/Four-Five/Five-Six  Decompression/Diskectomy/Fusion  Patient Location: PACU  Anesthesia Type:General  Level of Consciousness: awake, alert , oriented and patient cooperative  Airway & Oxygen Therapy: Patient Spontanous Breathing and Patient connected to nasal cannula oxygen  Post-op Assessment: Report given to PACU RN and Post -op Vital signs reviewed and stable  Post vital signs: Reviewed and stable  Complications: No apparent anesthesia complications

## 2013-12-30 NOTE — Progress Notes (Signed)
PT Cancellation/Discharge Note  Patient Details Name: Quintella ReichertJoe L Mach MRN: [id number] DOB: 11/27/1955   Cancelled Treatment:    Reason Eval/Treat Not Completed: PT screened, no needs identified, will sign off.  Patient ambulating with good balance per OT and patient.  No acute PT needs identified - PT will sign off.   Vena AustriaDavis, Tristy Udovich H 12/30/2013, 1:19 PM Durenda HurtSusan H. Renaldo Fiddleravis, PT, [facility] Hospital[facility] Acute Rehab Services Pager [phone]

## 2013-12-30 NOTE — Progress Notes (Signed)
Patient ID: Quintella ReichertJoe L Shoaf, male   DOB: 11/15/1955, 58 y.o.   MRN: [id number] Afebrile, vss Says he feels pretty good. Needs to increase activity. Will d/c drain today. Should be ready to go home tomorrow.

## 2013-12-30 NOTE — Evaluation (Signed)
Occupational Therapy Evaluation Patient Details Name: Jimmy Hall MRN: [id number] DOB: 1956-01-04 Today's Date: 12/30/2013    History of Present Illness 58 y.o. s/p ANTERIOR CERVICAL DECOMPRESSION/DISCECTOMY FUSION 3 LEVELS (N/A) - Anterior Cervical Three-Four/Four-Five/Five-Six  Decompression/Diskectomy/Fusion   Clinical Impression   Pt moving well during session. Education provided to pt and feel he is safe to d/c home, from OT standpoint, with family available to assist.     Follow Up Recommendations  No OT follow up;Supervision - Intermittent    Equipment Recommendations  3 in 1 bedside comode    Recommendations for Other Services       Precautions / Restrictions Precautions Precautions: Cervical Required Braces or Orthoses: Cervical Brace Cervical Brace: Soft collar Restrictions Weight Bearing Restrictions: No      Mobility Bed Mobility Overal bed mobility: Needs Assistance Bed Mobility: Rolling;Sidelying to Sit;Sit to Sidelying Rolling: Min assist;Supervision Sidelying to sit: Min guard;Supervision     Sit to sidelying: Supervision;Min guard General bed mobility comments: Cues for log roll technique. Practiced a few times.  Transfers Overall transfer level: Needs assistance Equipment used: None Transfers: Sit to/from Stand Sit to Stand: Supervision         General transfer comment: cues for technique/precautions.    Balance                                    ADL   Grooming: Oral care;Set up;Supervision/safety;Standing   Upper Body Dressing : Set up;Supervision/safety;Standing   Lower Body Dressing: Sit to/from stand;Supervision/safety Toilet Transfer: Ambulation;Supervision/safety   Tub/ Shower Transfer: Min guard;Ambulation;3 in 1 (practiced stepping over) Functional mobility during ADLs: Supervision/safety General ADL Comments: Educated on precautions and use of cup for teeth care as well as using straw. Educated on dressing  technique. Pt Hall to cross legs over knees for socks. Practiced tub transfer and recommended someone being with him when doing this at home. Recommended sitting on chair for bathing. Practiced bed mobility a few times. Told pt to ask MD about showering/collar.     Vision                     Perception     Praxis      Pertinent Vitals/Pain Pain 2/10. Increased activity during session.      Hand Dominance Right   Extremity/Trunk Assessment Upper Extremity Assessment Upper Extremity Assessment:  (slight weakness in hands)   Lower Extremity Assessment Lower Extremity Assessment: Overall WFL for tasks assessed       Communication Communication Communication: No difficulties   Cognition Arousal/Alertness: Awake/alert Behavior During Therapy: WFL for tasks assessed/performed Overall Cognitive Status: Within Functional Limits for tasks assessed                     General Comments       Exercises      Home Living Family/patient expects to be discharged to:: Private residence Living Arrangements: Spouse/significant other Available Help at Discharge: Family;Available 24 hours/day Type of Home: Apartment Home Access: Stairs to enter Entrance Stairs-Number of Steps: 8 Entrance Stairs-Rails: Left Home Layout: One level     Bathroom Shower/Tub: Chief Strategy Officer: Standard     Home Equipment: Environmental consultant - 2 wheels          Prior Functioning/Environment Level of Independence: Independent             OT Diagnosis:  OT Problem List:     OT Treatment/Interventions:      OT Goals(Current goals can be found in the care plan section)    OT Frequency:     Barriers to D/C:            End of Session: Equipment Utilized During Treatment: Gait belt;Cervical collar  Activity Tolerance: Patient tolerated treatment well Patient left: in chair;with call bell/phone within reach   Time: 1137-1203 OT Time Calculation (min): 26  min Charges:  OT General Charges $OT Visit: 1 Procedure OT Evaluation $Initial OT Evaluation Tier I: 1 Procedure OT Treatments $Self Care/Home Management : 8-22 mins G-CodesEarlie Raveling:    Sulma Ruffino L OTR/L [phone] 12/30/2013, 12:16 PM

## 2013-12-30 NOTE — Anesthesia Postprocedure Evaluation (Signed)
  Anesthesia Post-op Note  Patient: Jimmy Hall  Procedure(s) Performed: Procedure(s) with comments: ANTERIOR CERVICAL DECOMPRESSION/DISCECTOMY FUSION 3 LEVELS (N/A) - Anterior Cervical Three-Four/Four-Five/Five-Six  Decompression/Diskectomy/Fusion  Patient Location: PACU  Anesthesia Type:General  Level of Consciousness: awake, alert  and oriented  Airway and Oxygen Therapy: Patient Spontanous Breathing and Patient connected to nasal cannula oxygen  Post-op Pain: mild  Post-op Assessment: Post-op Vital signs reviewed, Patient's Cardiovascular Status Stable, Respiratory Function Stable, Patent Airway, No signs of Nausea or vomiting and Pain level controlled  Post-op Vital Signs: Reviewed and stable  Complications: No apparent anesthesia complications

## 2013-12-31 MED ORDER — DIAZEPAM 5 MG PO TABS: 5.0000 mg | ORAL_TABLET | Freq: Four times a day (QID) | ORAL | Status: DC | PRN

## 2013-12-31 MED ORDER — OXYCODONE-ACETAMINOPHEN 5-325 MG PO TABS: 1.0000 | ORAL_TABLET | ORAL | Status: DC | PRN

## 2013-12-31 NOTE — Discharge Summary (Signed)
   Physician Discharge Summary  Patient ID: Jimmy ReichertJoe L Kloehn MRN: [id number] DOB/AGE: 58/12/1955 58 y.o.  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 Jimmy ReichertJoe L Arshad is a 58 y.o. male electively admitted after undergoing C3-4, C4-5, and C5-C6 stenosis. Postoperatively, the patient was at his neurologic baseline and reporting good resolution of his left-sided pain symptoms. He is ambulating without difficulty, his cervical drain was removed on postoperative day #1. He continued to recover well, with pain controlled with oral medication, he was swallowing well, and voiding without difficulty. He was therefore deemed stable for discharge on postoperative day #2.  Treatments: Surgery - anterior cervical discectomy C3-4, C4-5, C5-6  Discharge Exam: Blood pressure 150/102, pulse 78, temperature 98.4 F (36.9 C), temperature source Oral, resp. rate 20, height 5\' 9"  (1.753 m), weight 109.43 kg (241 lb 4 oz), SpO2 96.00%. Awake, alert, oriented Speech fluent, appropriate CN grossly intact 5/5 BUE/BLE Wound c/d/i  Follow-up: Follow-up in Dr. Jeral FruitBotero office [facility] [phone]) in 2-3 weeks  Disposition: ED Dismiss - Never Arrived     Medication List    STOP taking these medications       HYDROcodone-acetaminophen 10-325 MG per tablet  Commonly known as:  NORCO     ibuprofen 200 MG tablet  Commonly known as:  ADVIL,MOTRIN      TAKE these medications       diazepam 5 MG tablet  Commonly known as:  VALIUM  Take 1 tablet (5 mg total) by mouth every 6 (six) hours as needed for muscle spasms.     oxyCODONE-acetaminophen 5-325 MG per tablet  Commonly known as:  PERCOCET/ROXICET  Take 1-2 tablets by mouth every 4 (four) hours as needed for moderate pain.          SignedLisbeth Renshaw: Burkley Dech, C 12/31/2013, 9:27 AM

## 2014-01-01 NOTE — Progress Notes (Signed)
RETRO REVIEW/ UR COMPLETED 

## 2014-01-01 NOTE — Op Note (Signed)
Jimmy Hall, Jimmy Hall NO.:  [id number]  MEDICAL RECORD NO.:  [id number]  LOCATION:                               FACILITY:  [facility]  PHYSICIAN:  [name] Cha, M.D.   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  History of lumbar spondylolisthesis.  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  History of lumbar spondylolisthesis.  PROCEDURE:  Anterior cervical 3-4, 4-5, 5-6 diskectomy, decompression of spinal cord, foraminotomy, interbody fusion with cages, plate from C3- C6.  Microscope.  SURGEON:  [name] Cha, M.D.  ASSISTANT:  Dr. Kathyrn Sheriff.  CLINICAL HISTORY:  [name] is a gentleman who was seen in my office, complaining of back pain rest to both legs.  Nevertheless, during my physical examination, I found that he was complaining of burning sensation going to both upper extremity.  He failed several times at work.  The MRI showed that indeed he has a lumbar spondylolisthesis, but an MRI of the cervical spine showed that he has severe stenosis from C3 down to C6.  Surgery was advised.  The patient knew the risk.  I had met with him and his family.  Also, he knew that this procedure will no affect whatsoever his lumbar pain.  DESCRIPTION OF PROCEDURE:  The patient was taken to the OR, and after intubation, the left side of the neck was cleaned with DuraPrep and drapes were applied.  Transverse incision was made through the skin, subcutaneous tissue, and straight to the cervical spine.  X-rays showed that the needles were at the L4-5, 5-6.  At the end, I brought the microscope into the area.  I opened the anterior ligament.  To open the ligament at the level of C3-C4, I used the drill.  Then, with the help of the microscope, a total diskectomy with  decompression of the spinal cord as well as the C4 nerve root was done.  The worst level was at an L4-5, where he has severe calcification bilaterally.  At the end, we have a good decompression at the level 4-5 with plenty of space for the C5 nerve root.  At the L5-6, the patient has a central herniated disk with foraminal narrow.  Decompression was achieved.  Then, 3 plates one of 6 mm and two of 7 mm were inserted.  The small one was at the level C3-C4 and another 2 are at L4-5, 5-6.  This was followed with a plate using 8 screws.  Lateral cervical spine x-ray showed good position of the upper part of the plate. The patient was 260 pounds, so was difficult to sit at the lower level.  From then on, the area was irrigated.  Hemostasis was accomplished.  The drain was left and the wound was closed with Vicryl and Steri-Strips.          ______________________________ [name] Cha, M.D.     EB/MEDQ  D:  12/29/2013  T:  12/30/2013  Job:  [id number]

## 2014-01-02 ENCOUNTER — Encounter (HOSPITAL_COMMUNITY): Payer: Self-pay | Admitting: Neurosurgery

## 2014-02-02 HISTORY — PX: BACK SURGERY: SHX140

## 2014-02-15 ENCOUNTER — Other Ambulatory Visit: Payer: Self-pay | Admitting: Neurosurgery

## 2014-02-16 ENCOUNTER — Encounter (HOSPITAL_COMMUNITY): Payer: Self-pay | Admitting: Pharmacy Technician

## 2014-02-27 ENCOUNTER — Encounter (HOSPITAL_COMMUNITY): Payer: Self-pay

## 2014-02-27 ENCOUNTER — Encounter (HOSPITAL_COMMUNITY)
Admission: RE | Admit: 2014-02-27 | Discharge: 2014-02-27 | Disposition: A | Discharge status: Home / Self Care | Payer: BC Managed Care – PPO | Source: Ambulatory Visit | Attending: Neurosurgery | Admitting: Neurosurgery

## 2014-02-27 DIAGNOSIS — M4310 Spondylolisthesis, site unspecified: Secondary | ICD-10-CM | POA: Diagnosis not present

## 2014-02-27 DIAGNOSIS — Z0183 Encounter for blood typing: Secondary | ICD-10-CM | POA: Diagnosis not present

## 2014-02-27 DIAGNOSIS — Z01812 Encounter for preprocedural laboratory examination: Secondary | ICD-10-CM | POA: Diagnosis present

## 2014-02-27 HISTORY — DX: Pain in unspecified joint: M25.50

## 2014-02-27 HISTORY — DX: Anesthesia of skin: R20.0

## 2014-02-27 HISTORY — DX: Personal history of other diseases of the respiratory system: Z87.09

## 2014-02-27 HISTORY — DX: Other seasonal allergic rhinitis: J30.2

## 2014-02-27 HISTORY — DX: Personal history of other diseases of the musculoskeletal system and connective tissue: Z87.39

## 2014-02-27 LAB — SURGICAL PCR SCREEN
MRSA, PCR: NEGATIVE
Staphylococcus aureus: NEGATIVE

## 2014-02-27 LAB — CBC
HCT: 46.6 % (ref 39.0–52.0)
Hemoglobin: 15.2 g/dL (ref 13.0–17.0)
MCH: 28.9 pg (ref 26.0–34.0)
MCHC: 32.6 g/dL (ref 30.0–36.0)
MCV: 88.6 fL (ref 78.0–100.0)
Platelets: 231 10*3/uL (ref 150–400)
RBC: 5.26 MIL/uL (ref 4.22–5.81)
RDW: 14.2 % (ref 11.5–15.5)
WBC: 8.7 10*3/uL (ref 4.0–10.5)

## 2014-02-27 LAB — TYPE AND SCREEN
ABO/RH(D): A POS
Antibody Screen: NEGATIVE

## 2014-02-27 LAB — ABO/RH: ABO/RH(D): A POS

## 2014-02-27 NOTE — Progress Notes (Addendum)
Pt doesn't have a cardiologist  Denies ever having a stress test/echo/heart cath  Denies CXR in past yr   EKG in epic from 05-08-13  Medical Md is with [facility]

## 2014-02-27 NOTE — Pre-Procedure Instructions (Signed)
Jimmy Hall  02/27/2014   Your procedure is scheduled on:  Fri, May 29 @ 9:00 AM  Report to [facility] Entrance A  at 6:00 AM.  Call this number if you have problems the morning of surgery: [phone]   Remember:   Do not eat food or drink liquids after midnight.   Take these medicines the morning of surgery with A SIP OF WATER: Pain Pill(if needed)               Stop taking your Ibuprofen. No Goody's,BC's,Aleve,Aspirin,Fish Oil,or any Herbal Medications   Do not wear jewelry  Do not wear lotions, powders, or colognes. You may wear deodorant.  Men may shave face and neck.  Do not bring valuables to the hospital.  [facility] is not responsible                  for any belongings or valuables.               Contacts, dentures or bridgework may not be worn into surgery.  Leave suitcase in the car. After surgery it may be brought to your room.  For patients admitted to the hospital, discharge time is determined by your                treatment team.                 Special Instructions:  [place] - Preparing for Surgery  Before surgery, you can play an important role.  Because skin is not sterile, your skin needs to be as free of germs as possible.  You can reduce the number of germs on you skin by washing with CHG (chlorahexidine gluconate) soap before surgery.  CHG is an antiseptic cleaner which kills germs and bonds with the skin to continue killing germs even after washing.  Please DO NOT use if you have an allergy to CHG or antibacterial soaps.  If your skin becomes reddened/irritated stop using the CHG and inform your nurse when you arrive at Short Stay.  Do not shave (including legs and underarms) for at least 48 hours prior to the first CHG shower.  You may shave your face.  Please follow these instructions carefully:   1.  Shower with CHG Soap the night before surgery and the                                morning of Surgery.  2.  If you choose to wash your hair, wash  your hair first as usual with your       normal shampoo.  3.  After you shampoo, rinse your hair and body thoroughly to remove the                      Shampoo.  4.  Use CHG as you would any other liquid soap.  You can apply chg directly       to the skin and wash gently with scrungie or a clean washcloth.  5.  Apply the CHG Soap to your body ONLY FROM THE NECK DOWN.        Do not use on open wounds or open sores.  Avoid contact with your eyes,       ears, mouth and genitals (private parts).  Wash genitals (private parts)       with your normal soap.  6.  Wash thoroughly, paying special attention to the area where your surgery        will be performed.  7.  Thoroughly rinse your body with warm water from the neck down.  8.  DO NOT shower/wash with your normal soap after using and rinsing off       the CHG Soap.  9.  Pat yourself dry with a clean towel.            10.  Wear clean pajamas.            11.  Place clean sheets on your bed the night of your first shower and do not        sleep with pets.  Day of Surgery  Do not apply any lotions/deoderants the morning of surgery.  Please wear clean clothes to the hospital/surgery center.     Please read over the following fact sheets that you were given: Pain Booklet, Coughing and Deep Breathing, Blood Transfusion Information, MRSA Information and Surgical Site Infection Prevention

## 2014-03-01 MED ORDER — CEFAZOLIN SODIUM-DEXTROSE 2-3 GM-% IV SOLR
2.0000 g | INTRAVENOUS | Status: AC
  Administered 2014-03-02: 2 g via INTRAVENOUS
  Filled 2014-03-01: qty 50

## 2014-03-02 ENCOUNTER — Ambulatory Visit (HOSPITAL_COMMUNITY): Payer: BC Managed Care – PPO | Admitting: Anesthesiology

## 2014-03-02 ENCOUNTER — Encounter (HOSPITAL_COMMUNITY)
Admission: RE | Disposition: A | Discharge status: Home / Self Care | Payer: BC Managed Care – PPO | Source: Ambulatory Visit | Attending: Neurosurgery

## 2014-03-02 ENCOUNTER — Encounter (HOSPITAL_COMMUNITY): Payer: Self-pay | Admitting: *Deleted

## 2014-03-02 ENCOUNTER — Inpatient Hospital Stay (HOSPITAL_COMMUNITY): Payer: BC Managed Care – PPO

## 2014-03-02 ENCOUNTER — Inpatient Hospital Stay (HOSPITAL_COMMUNITY)
Admission: RE | Admit: 2014-03-02 | Discharge: 2014-03-07 | DRG: 460 | Disposition: A | Discharge status: Home / Self Care | Payer: BC Managed Care – PPO | Source: Ambulatory Visit | Attending: Neurosurgery | Admitting: Neurosurgery

## 2014-03-02 ENCOUNTER — Encounter (HOSPITAL_COMMUNITY): Payer: BC Managed Care – PPO | Admitting: Anesthesiology

## 2014-03-02 DIAGNOSIS — Q762 Congenital spondylolisthesis: Secondary | ICD-10-CM | POA: Diagnosis not present

## 2014-03-02 DIAGNOSIS — Z87891 Personal history of nicotine dependence: Secondary | ICD-10-CM

## 2014-03-02 DIAGNOSIS — M48061 Spinal stenosis, lumbar region without neurogenic claudication: Secondary | ICD-10-CM | POA: Diagnosis present

## 2014-03-02 DIAGNOSIS — R29898 Other symptoms and signs involving the musculoskeletal system: Secondary | ICD-10-CM | POA: Diagnosis present

## 2014-03-02 DIAGNOSIS — G8929 Other chronic pain: Secondary | ICD-10-CM | POA: Diagnosis present

## 2014-03-02 DIAGNOSIS — Z823 Family history of stroke: Secondary | ICD-10-CM | POA: Diagnosis not present

## 2014-03-02 DIAGNOSIS — Z01812 Encounter for preprocedural laboratory examination: Secondary | ICD-10-CM | POA: Diagnosis not present

## 2014-03-02 DIAGNOSIS — M48062 Spinal stenosis, lumbar region with neurogenic claudication: Principal | ICD-10-CM | POA: Diagnosis present

## 2014-03-02 DIAGNOSIS — M4316 Spondylolisthesis, lumbar region: Secondary | ICD-10-CM | POA: Diagnosis present

## 2014-03-02 SURGERY — POSTERIOR LUMBAR FUSION 1 LEVEL
Anesthesia: General

## 2014-03-02 MED ORDER — MIDAZOLAM HCL 2 MG/2ML IJ SOLN
INTRAMUSCULAR | Status: AC
  Filled 2014-03-02: qty 2

## 2014-03-02 MED ORDER — SUCCINYLCHOLINE CHLORIDE 20 MG/ML IJ SOLN
INTRAMUSCULAR | Status: AC
  Filled 2014-03-02: qty 1

## 2014-03-02 MED ORDER — ACETAMINOPHEN 325 MG PO TABS: 650.0000 mg | ORAL_TABLET | ORAL | Status: DC | PRN

## 2014-03-02 MED ORDER — MORPHINE SULFATE (PF) 1 MG/ML IV SOLN
INTRAVENOUS | Status: DC
  Administered 2014-03-02: 13:00:00 via INTRAVENOUS
  Administered 2014-03-03: 1.5 mg via INTRAVENOUS
  Administered 2014-03-03: 6 mg via INTRAVENOUS
  Administered 2014-03-03: 25 mg via INTRAVENOUS
  Administered 2014-03-04: 3 mg via INTRAVENOUS
  Administered 2014-03-04: 1.6 mg via INTRAVENOUS
  Administered 2014-03-04: 6 mg via INTRAVENOUS
  Administered 2014-03-05: 08:00:00 via INTRAVENOUS
  Administered 2014-03-05: 6 mg via INTRAVENOUS
  Administered 2014-03-05 (×2): 1.5 mg via INTRAVENOUS
  Administered 2014-03-05: 8.52 mg via INTRAVENOUS
  Administered 2014-03-06: 6 mg via INTRAVENOUS
  Administered 2014-03-06: 25 mg via INTRAVENOUS
  Administered 2014-03-06: 3 mg via INTRAVENOUS
  Filled 2014-03-02 (×3): qty 25

## 2014-03-02 MED ORDER — ROCURONIUM BROMIDE 100 MG/10ML IV SOLN
INTRAVENOUS | Status: DC | PRN
Start: 2014-03-02 — End: 2014-03-02
  Administered 2014-03-02: 50 mg via INTRAVENOUS

## 2014-03-02 MED ORDER — SODIUM CHLORIDE 0.9 % IV SOLN: INTRAVENOUS | Status: DC

## 2014-03-02 MED ORDER — ONDANSETRON HCL 4 MG/2ML IJ SOLN: 4.0000 mg | INTRAMUSCULAR | Status: DC | PRN

## 2014-03-02 MED ORDER — SODIUM CHLORIDE 0.9 % IJ SOLN
INTRAMUSCULAR | Status: AC
  Filled 2014-03-02: qty 10

## 2014-03-02 MED ORDER — HYDROMORPHONE HCL PF 1 MG/ML IJ SOLN
0.2500 mg | INTRAMUSCULAR | Status: DC | PRN
Start: 2014-03-02 — End: 2014-03-02
  Administered 2014-03-02 (×4): 0.5 mg via INTRAVENOUS

## 2014-03-02 MED ORDER — LIDOCAINE HCL (CARDIAC) 20 MG/ML IV SOLN
INTRAVENOUS | Status: AC
  Filled 2014-03-02: qty 5

## 2014-03-02 MED ORDER — LACTATED RINGERS IV SOLN
INTRAVENOUS | Status: DC | PRN
Start: 2014-03-02 — End: 2014-03-02
  Administered 2014-03-02 (×4): via INTRAVENOUS

## 2014-03-02 MED ORDER — FENTANYL CITRATE 0.05 MG/ML IJ SOLN
INTRAMUSCULAR | Status: AC
  Filled 2014-03-02: qty 5

## 2014-03-02 MED ORDER — NALOXONE HCL 0.4 MG/ML IJ SOLN: 0.4000 mg | INTRAMUSCULAR | Status: DC | PRN

## 2014-03-02 MED ORDER — OXYCODONE-ACETAMINOPHEN 5-325 MG PO TABS
1.0000 | ORAL_TABLET | ORAL | Status: DC | PRN
  Administered 2014-03-03 – 2014-03-07 (×10): 2 via ORAL
  Filled 2014-03-02 (×10): qty 2

## 2014-03-02 MED ORDER — VANCOMYCIN HCL 1000 MG IV SOLR
INTRAVENOUS | Status: DC | PRN
  Administered 2014-03-02: 1000 mg

## 2014-03-02 MED ORDER — BUPIVACAINE LIPOSOME 1.3 % IJ SUSP
20.0000 mL | INTRAMUSCULAR | Status: AC
  Filled 2014-03-02: qty 20

## 2014-03-02 MED ORDER — SODIUM CHLORIDE 0.9 % IV SOLN
250.0000 mL | INTRAVENOUS | Status: DC
  Administered 2014-03-02: 250 mL via INTRAVENOUS

## 2014-03-02 MED ORDER — ROCURONIUM BROMIDE 50 MG/5ML IV SOLN
INTRAVENOUS | Status: AC
  Filled 2014-03-02: qty 1

## 2014-03-02 MED ORDER — EPHEDRINE SULFATE 50 MG/ML IJ SOLN
INTRAMUSCULAR | Status: AC
  Filled 2014-03-02: qty 1

## 2014-03-02 MED ORDER — BUPIVACAINE LIPOSOME 1.3 % IJ SUSP
INTRAMUSCULAR | Status: DC | PRN
  Administered 2014-03-02: 20 mL

## 2014-03-02 MED ORDER — PROPOFOL 10 MG/ML IV BOLUS
INTRAVENOUS | Status: AC
  Filled 2014-03-02: qty 20

## 2014-03-02 MED ORDER — SODIUM CHLORIDE 0.9 % IJ SOLN
3.0000 mL | Freq: Two times a day (BID) | INTRAMUSCULAR | Status: DC
  Administered 2014-03-02 – 2014-03-03 (×2): 3 mL via INTRAVENOUS

## 2014-03-02 MED ORDER — LACTATED RINGERS IV SOLN
INTRAVENOUS | Status: DC
  Administered 2014-03-02: 07:00:00 via INTRAVENOUS

## 2014-03-02 MED ORDER — DIAZEPAM 5 MG PO TABS
ORAL_TABLET | ORAL | Status: AC
  Filled 2014-03-02: qty 1

## 2014-03-02 MED ORDER — HYDROMORPHONE HCL PF 1 MG/ML IJ SOLN
INTRAMUSCULAR | Status: AC
  Filled 2014-03-02: qty 1

## 2014-03-02 MED ORDER — OXYCODONE HCL 5 MG/5ML PO SOLN: 5.0000 mg | Freq: Once | ORAL | Status: AC | PRN

## 2014-03-02 MED ORDER — 0.9 % SODIUM CHLORIDE (POUR BTL) OPTIME
TOPICAL | Status: DC | PRN
  Administered 2014-03-02: 1000 mL

## 2014-03-02 MED ORDER — FENTANYL CITRATE 0.05 MG/ML IJ SOLN
INTRAMUSCULAR | Status: DC | PRN
  Administered 2014-03-02 (×2): 100 ug via INTRAVENOUS
  Administered 2014-03-02: 50 ug via INTRAVENOUS
  Administered 2014-03-02: 150 ug via INTRAVENOUS
  Administered 2014-03-02 (×2): 100 ug via INTRAVENOUS
  Administered 2014-03-02: 50 ug via INTRAVENOUS
  Administered 2014-03-02: 100 ug via INTRAVENOUS

## 2014-03-02 MED ORDER — SODIUM CHLORIDE 0.9 % IJ SOLN: 3.0000 mL | INTRAMUSCULAR | Status: DC | PRN

## 2014-03-02 MED ORDER — THROMBIN 5000 UNITS EX SOLR
OROMUCOSAL | Status: DC | PRN
  Administered 2014-03-02: 10:00:00 via TOPICAL

## 2014-03-02 MED ORDER — PHENOL 1.4 % MT LIQD: 1.0000 | OROMUCOSAL | Status: DC | PRN

## 2014-03-02 MED ORDER — ZOLPIDEM TARTRATE 5 MG PO TABS: 5.0000 mg | ORAL_TABLET | Freq: Every evening | ORAL | Status: DC | PRN

## 2014-03-02 MED ORDER — ONDANSETRON HCL 4 MG/2ML IJ SOLN: 4.0000 mg | Freq: Four times a day (QID) | INTRAMUSCULAR | Status: DC | PRN

## 2014-03-02 MED ORDER — DIPHENHYDRAMINE HCL 50 MG/ML IJ SOLN: 12.5000 mg | Freq: Four times a day (QID) | INTRAMUSCULAR | Status: DC | PRN

## 2014-03-02 MED ORDER — DIPHENHYDRAMINE HCL 12.5 MG/5ML PO ELIX: 12.5000 mg | ORAL_SOLUTION | Freq: Four times a day (QID) | ORAL | Status: DC | PRN

## 2014-03-02 MED ORDER — LIDOCAINE HCL (CARDIAC) 20 MG/ML IV SOLN
INTRAVENOUS | Status: DC | PRN
  Administered 2014-03-02: 80 mg via INTRAVENOUS

## 2014-03-02 MED ORDER — PROPOFOL 10 MG/ML IV BOLUS
INTRAVENOUS | Status: DC | PRN
  Administered 2014-03-02: 200 mg via INTRAVENOUS

## 2014-03-02 MED ORDER — CEFAZOLIN SODIUM 1-5 GM-% IV SOLN
1.0000 g | Freq: Three times a day (TID) | INTRAVENOUS | Status: AC
  Administered 2014-03-02 – 2014-03-03 (×2): 1 g via INTRAVENOUS
  Filled 2014-03-02 (×2): qty 50

## 2014-03-02 MED ORDER — PHENYLEPHRINE HCL 10 MG/ML IJ SOLN
INTRAMUSCULAR | Status: DC | PRN
  Administered 2014-03-02 (×3): 80 ug via INTRAVENOUS

## 2014-03-02 MED ORDER — SODIUM CHLORIDE 0.9 % IJ SOLN: 9.0000 mL | INTRAMUSCULAR | Status: DC | PRN

## 2014-03-02 MED ORDER — MORPHINE SULFATE (PF) 1 MG/ML IV SOLN
INTRAVENOUS | Status: AC
  Filled 2014-03-02: qty 25

## 2014-03-02 MED ORDER — ONDANSETRON HCL 4 MG/2ML IJ SOLN
INTRAMUSCULAR | Status: AC
  Filled 2014-03-02: qty 2

## 2014-03-02 MED ORDER — ONDANSETRON HCL 4 MG/2ML IJ SOLN
INTRAMUSCULAR | Status: DC | PRN
  Administered 2014-03-02: 4 mg via INTRAVENOUS

## 2014-03-02 MED ORDER — OXYCODONE HCL 5 MG PO TABS
ORAL_TABLET | ORAL | Status: AC
  Filled 2014-03-02: qty 1

## 2014-03-02 MED ORDER — ONDANSETRON HCL 4 MG/2ML IJ SOLN: 4.0000 mg | Freq: Once | INTRAMUSCULAR | Status: DC | PRN

## 2014-03-02 MED ORDER — DIAZEPAM 5 MG PO TABS
5.0000 mg | ORAL_TABLET | Freq: Four times a day (QID) | ORAL | Status: DC | PRN
  Administered 2014-03-02 – 2014-03-07 (×13): 5 mg via ORAL
  Filled 2014-03-02 (×12): qty 1

## 2014-03-02 MED ORDER — VANCOMYCIN HCL 1000 MG IV SOLR
INTRAVENOUS | Status: AC
  Filled 2014-03-02: qty 1000

## 2014-03-02 MED ORDER — PHENYLEPHRINE 40 MCG/ML (10ML) SYRINGE FOR IV PUSH (FOR BLOOD PRESSURE SUPPORT)
PREFILLED_SYRINGE | INTRAVENOUS | Status: AC
  Filled 2014-03-02: qty 10

## 2014-03-02 MED ORDER — ACETAMINOPHEN 650 MG RE SUPP: 650.0000 mg | RECTAL | Status: DC | PRN

## 2014-03-02 MED ORDER — MENTHOL 3 MG MT LOZG: 1.0000 | LOZENGE | OROMUCOSAL | Status: DC | PRN

## 2014-03-02 MED ORDER — THROMBIN 20000 UNITS EX SOLR
CUTANEOUS | Status: DC | PRN
  Administered 2014-03-02: 10:00:00 via TOPICAL

## 2014-03-02 MED ORDER — MIDAZOLAM HCL 5 MG/5ML IJ SOLN
INTRAMUSCULAR | Status: DC | PRN
Start: 2014-03-02 — End: 2014-03-02
  Administered 2014-03-02: 2 mg via INTRAVENOUS

## 2014-03-02 MED ORDER — OXYCODONE HCL 5 MG PO TABS
5.0000 mg | ORAL_TABLET | Freq: Once | ORAL | Status: AC | PRN
  Administered 2014-03-02: 5 mg via ORAL

## 2014-03-02 MED ORDER — DEXAMETHASONE SODIUM PHOSPHATE 10 MG/ML IJ SOLN
INTRAMUSCULAR | Status: DC | PRN
  Administered 2014-03-02: 10 mg via INTRAVENOUS

## 2014-03-02 MED FILL — Heparin Sodium (Porcine) Inj 1000 Unit/ML: INTRAMUSCULAR | Qty: 30 | Status: AC

## 2014-03-02 MED FILL — Sodium Chloride Irrigation Soln 0.9%: Qty: 3000 | Status: AC

## 2014-03-02 MED FILL — Sodium Chloride IV Soln 0.9%: INTRAVENOUS | Qty: 1000 | Status: AC

## 2014-03-02 SURGICAL SUPPLY — 78 items
BENZOIN TINCTURE PRP APPL 2/3 (GAUZE/BANDAGES/DRESSINGS) ×3 IMPLANT
BLADE 10 SAFETY STRL DISP (BLADE) ×3 IMPLANT
BLADE SURG ROTATE 9660 (MISCELLANEOUS) IMPLANT
BUR ACORN 6.0 (BURR) ×2 IMPLANT
BUR ACORN 6.0MM (BURR) ×1
BUR MATCHSTICK NEURO 3.0 LAGG (BURR) ×3 IMPLANT
CANISTER SUCT 3000ML (MISCELLANEOUS) ×3 IMPLANT
CAP REVERE LOCKING (Cap) ×12 IMPLANT
CLOSURE WOUND 1/2 X4 (GAUZE/BANDAGES/DRESSINGS) ×1
CONN CROSSLINK REV 38-50MM (Connector) ×3 IMPLANT
CONNECTOR CRSLINK REV 38-50MM (Connector) ×1 IMPLANT
CONT SPEC 4OZ CLIKSEAL STRL BL (MISCELLANEOUS) ×3 IMPLANT
COVER BACK TABLE 24X17X13 BIG (DRAPES) IMPLANT
COVER TABLE BACK 60X90 (DRAPES) ×3 IMPLANT
DRAPE C-ARM 42X72 X-RAY (DRAPES) ×6 IMPLANT
DRAPE LAPAROTOMY 100X72X124 (DRAPES) ×3 IMPLANT
DRAPE POUCH INSTRU U-SHP 10X18 (DRAPES) ×3 IMPLANT
DRSG OPSITE 4X5.5 SM (GAUZE/BANDAGES/DRESSINGS) ×3 IMPLANT
DRSG OPSITE POSTOP 4X6 (GAUZE/BANDAGES/DRESSINGS) ×3 IMPLANT
DURAPREP 26ML APPLICATOR (WOUND CARE) ×3 IMPLANT
ELECT BLADE 4.0 EZ CLEAN MEGAD (MISCELLANEOUS) ×6
ELECT REM PT RETURN 9FT ADLT (ELECTROSURGICAL) ×3
ELECTRODE BLDE 4.0 EZ CLN MEGD (MISCELLANEOUS) ×2 IMPLANT
ELECTRODE REM PT RTRN 9FT ADLT (ELECTROSURGICAL) ×1 IMPLANT
EVACUATOR 1/8 PVC DRAIN (DRAIN) IMPLANT
EVACUATOR 3/16  PVC DRAIN (DRAIN) ×2
EVACUATOR 3/16 PVC DRAIN (DRAIN) ×1 IMPLANT
GAUZE SPONGE 4X4 16PLY XRAY LF (GAUZE/BANDAGES/DRESSINGS) ×3 IMPLANT
GLOVE BIOGEL M 8.0 STRL (GLOVE) ×3 IMPLANT
GLOVE BIOGEL PI IND STRL 7.0 (GLOVE) ×3 IMPLANT
GLOVE BIOGEL PI INDICATOR 7.0 (GLOVE) ×6
GLOVE ECLIPSE 6.5 STRL STRAW (GLOVE) ×3 IMPLANT
GLOVE EXAM NITRILE LRG STRL (GLOVE) IMPLANT
GLOVE EXAM NITRILE MD LF STRL (GLOVE) IMPLANT
GLOVE EXAM NITRILE XL STR (GLOVE) IMPLANT
GLOVE EXAM NITRILE XS STR PU (GLOVE) IMPLANT
GLOVE SURG SS PI 7.0 STRL IVOR (GLOVE) ×12 IMPLANT
GOWN STRL REUS W/ TWL LRG LVL3 (GOWN DISPOSABLE) ×2 IMPLANT
GOWN STRL REUS W/ TWL XL LVL3 (GOWN DISPOSABLE) ×2 IMPLANT
GOWN STRL REUS W/TWL 2XL LVL3 (GOWN DISPOSABLE) IMPLANT
GOWN STRL REUS W/TWL LRG LVL3 (GOWN DISPOSABLE) ×4
GOWN STRL REUS W/TWL XL LVL3 (GOWN DISPOSABLE) ×4
HEMOSTAT POWDER SURGIFOAM 1G (HEMOSTASIS) ×3 IMPLANT
KIT BASIN OR (CUSTOM PROCEDURE TRAY) ×3 IMPLANT
KIT INFUSE MEDIUM (Orthopedic Implant) ×3 IMPLANT
KIT ROOM TURNOVER OR (KITS) ×3 IMPLANT
MILL MEDIUM DISP (BLADE) ×3 IMPLANT
NEEDLE HYPO 18GX1.5 BLUNT FILL (NEEDLE) IMPLANT
NEEDLE HYPO 21X1.5 SAFETY (NEEDLE) ×3 IMPLANT
NEEDLE HYPO 25X1 1.5 SAFETY (NEEDLE) IMPLANT
NS IRRIG 1000ML POUR BTL (IV SOLUTION) ×3 IMPLANT
PACK LAMINECTOMY NEURO (CUSTOM PROCEDURE TRAY) ×3 IMPLANT
PAD ABD 8X10 STRL (GAUZE/BANDAGES/DRESSINGS) IMPLANT
PAD ARMBOARD 7.5X6 YLW CONV (MISCELLANEOUS) ×9 IMPLANT
PATTIES SURGICAL .5 X1 (DISPOSABLE) ×3 IMPLANT
PATTIES SURGICAL .5 X3 (DISPOSABLE) IMPLANT
PENCIL BUTTON HOLSTER BLD 10FT (ELECTRODE) ×3 IMPLANT
PENCIL FOOT CONTROL (ELECTRODE) ×3 IMPLANT
ROD REVERE 6.35 45MM (Rod) ×6 IMPLANT
SCREW REVERE 6.35 6.5X40MM (Screw) ×3 IMPLANT
SCREW REVERE 6.5X50MM (Screw) ×9 IMPLANT
SPACER SUSTAIN O 10X26 15MM (Spacer) ×6 IMPLANT
SPONGE GAUZE 4X4 12PLY (GAUZE/BANDAGES/DRESSINGS) ×3 IMPLANT
SPONGE LAP 4X18 X RAY DECT (DISPOSABLE) IMPLANT
SPONGE NEURO XRAY DETECT 1X3 (DISPOSABLE) IMPLANT
SPONGE SURGIFOAM ABS GEL 100 (HEMOSTASIS) ×3 IMPLANT
STRIP CLOSURE SKIN 1/2X4 (GAUZE/BANDAGES/DRESSINGS) ×2 IMPLANT
SUT VIC AB 1 CT1 18XBRD ANBCTR (SUTURE) ×2 IMPLANT
SUT VIC AB 1 CT1 8-18 (SUTURE) ×4
SUT VIC AB 2-0 CP2 18 (SUTURE) ×3 IMPLANT
SUT VIC AB 3-0 SH 8-18 (SUTURE) ×3 IMPLANT
SYR 20CC LL (SYRINGE) ×3 IMPLANT
SYR 20ML ECCENTRIC (SYRINGE) ×3 IMPLANT
SYR 5ML LL (SYRINGE) IMPLANT
TOWEL OR 17X24 6PK STRL BLUE (TOWEL DISPOSABLE) ×3 IMPLANT
TOWEL OR 17X26 10 PK STRL BLUE (TOWEL DISPOSABLE) ×3 IMPLANT
TRAY FOLEY CATH 16FRSI W/METER (SET/KITS/TRAYS/PACK) ×3 IMPLANT
WATER STERILE IRR 1000ML POUR (IV SOLUTION) ×3 IMPLANT

## 2014-03-02 NOTE — Progress Notes (Signed)
New admit from PACU, patient alert and oriented x4. Patient's incision site is intact with minor bloody drainage

## 2014-03-02 NOTE — Anesthesia Preprocedure Evaluation (Signed)
Anesthesia Evaluation  Patient identified by MRN, date of birth, ID band Patient awake    Reviewed: Allergy & Precautions, H&P , NPO status , Patient's Chart, lab work & pertinent test results  Airway Mallampati: I  TM Distance: >3 FB Neck ROM: Full    Dental  (+) Teeth Intact, Dental Advisory Given   Pulmonary former smoker,  breath sounds clear to auscultation        Cardiovascular Rhythm:Regular Rate:Normal     Neuro/Psych    GI/Hepatic   Endo/Other    Renal/GU      Musculoskeletal   Abdominal   Peds  Hematology   Anesthesia Other Findings   Reproductive/Obstetrics                             Anesthesia Physical Anesthesia Plan  ASA: I  Anesthesia Plan: General   Post-op Pain Management:    Induction: Intravenous  Airway Management Planned: Oral ETT  Additional Equipment:   Intra-op Plan:   Post-operative Plan: Extubation in OR  Informed Consent: I have reviewed the patients History and Physical, chart, labs and discussed the procedure including the risks, benefits and alternatives for the proposed anesthesia with the patient or authorized representative who has indicated his/her understanding and acceptance.   Dental advisory given  Plan Discussed with: CRNA, Anesthesiologist and Surgeon  Anesthesia Plan Comments:         Anesthesia Quick Evaluation  

## 2014-03-02 NOTE — Transfer of Care (Signed)
Immediate Anesthesia Transfer of Care Note  Patient: Jimmy Hall  Procedure(s) Performed: Procedure(s) with comments: LUMBAR THREE TO FOUR POSTERIOR LUMBAR FUSION 1 LEVEL (N/A) - L3-4 Posterior lumbar interbody fusion  Patient Location: PACU  Anesthesia Type:General  Level of Consciousness: awake, alert , oriented and patient cooperative  Airway & Oxygen Therapy: Patient Spontanous Breathing and Patient connected to nasal cannula oxygen  Post-op Assessment: Report given to PACU RN, Post -op Vital signs reviewed and stable and Patient moving all extremities  Post vital signs: Reviewed and stable  Complications: No apparent anesthesia complications

## 2014-03-02 NOTE — H&P (Signed)
Jimmy Hall is an 58 y.o. male.   Chief Complaint: weakness both legs HPI: patient with lumbar pain getting worse with ambulation and better with sitting and flexing his spine. It was found that he had a lumbar spondylolisthesis at l3-4 with stenosis., but also a cervical stenosis  Which already was taken care wirh ACD  Past Medical History  Diagnosis Date  . Chronic back pain     spondylolisthesis  . Seasonal allergies     takes OTC meds maybe once a week  . History of bronchitis     32yrs ago and only one time  . Numbness     left 4th and 5th finger  . Joint pain     knees  . History of gout     not on any meds    Past Surgical History  Procedure Laterality Date  . Hernia repair    . Anterior cervical decomp/discectomy fusion N/A 12/29/2013    Procedure: ANTERIOR CERVICAL DECOMPRESSION/DISCECTOMY FUSION 3 LEVELS;  Surgeon: Karn Cassis, MD;  Location: [facility];  Service: Neurosurgery;  Laterality: N/A;  Anterior Cervical Three-Four/Four-Five/Five-Six  Decompression/Diskectomy/Fusion    Family History  Problem Relation Age of Onset  . Stroke Mother    Social History:  reports that he has quit smoking. His smoking use included Cigarettes. He started smoking about 40 years ago. He smoked 0.50 packs per day. He has never used smokeless tobacco. He reports that he drinks alcohol. He reports that he does not use illicit drugs.  Allergies: No Known Allergies  Medications Prior to Admission  Medication Sig Dispense Refill  . ibuprofen (ADVIL,MOTRIN) 200 MG tablet Take 400 mg by mouth every 6 (six) hours as needed for moderate pain.      Marland Kitchen oxyCODONE-acetaminophen (PERCOCET/ROXICET) 5-325 MG per tablet Take 1-2 tablets by mouth every 4 (four) hours as needed for moderate pain.  30 tablet  0    No results found for this or any previous visit (from the past 48 hour(s)). No results found.  Review of Systems  Constitutional: Negative.   Eyes: Negative.   Respiratory:  Negative.   Cardiovascular: Negative.   Gastrointestinal: Negative.   Genitourinary: Negative.   Musculoskeletal: Positive for back pain and neck pain.  Skin: Negative.   Neurological: Positive for sensory change and focal weakness.  Endo/Heme/Allergies: Negative.     Blood pressure 151/74, pulse 68, temperature 97.4 F (36.3 C), temperature source Oral, resp. rate 20, weight 247 lb (112.038 kg), SpO2 100.00%. Physical Exam hent,nl. Neck, anterioe r scar. Cv, nl. Lungs, clear. Abdomen,soft. Extremities, nl. NEURO some weanness postop of deltoids. Femoral strecth maneuver positive bilaterally. Mri shws stenosis with spondylolisthesis at l34  Assessment/Plan Decompression and fusion at l3-4. He and his family are aware of risks and benefits Karn Cassis 03/02/2014, 8:41 AM

## 2014-03-02 NOTE — Anesthesia Postprocedure Evaluation (Signed)
  Anesthesia Post-op Note  Patient: Jimmy Hall  Procedure(s) Performed: Procedure(s) with comments: LUMBAR THREE TO FOUR POSTERIOR LUMBAR FUSION 1 LEVEL (N/A) - L3-4 Posterior lumbar interbody fusion  Patient Location: PACU  Anesthesia Type:General  Level of Consciousness: awake, alert  and oriented  Airway and Oxygen Therapy: Patient Spontanous Breathing and Patient connected to nasal cannula oxygen  Post-op Pain: mild  Post-op Assessment: Post-op Vital signs reviewed  Post-op Vital Signs: Reviewed  Last Vitals:  Filed Vitals:   03/02/14 1321  BP:   Pulse: 85  Temp:   Resp: 9    Complications: No apparent anesthesia complications

## 2014-03-03 NOTE — Op Note (Signed)
Jimmy Hall, Jimmy Hall NO.:  [id number]  MEDICAL RECORD NO.:  [id number]  LOCATION:  [id number]                        FACILITY:  [facility]  PHYSICIAN:  Hilda Lias, M.D.   DATE OF BIRTH:  [birth year]  DATE OF PROCEDURE:  03/02/2014 DATE OF DISCHARGE:                              OPERATIVE REPORT   PREOPERATIVE DIAGNOSES: 1. L3-L4 spondylolisthesis grade 2 with severe stenosis. 2. Neurogenic claudication.  POSTOPERATIVE DIAGNOSES: 1. L3-L4 spondylolisthesis grade 2 with severe stenosis. 2. Neurogenic claudication.  PROCEDURE: 1. Bilateral L3 laminectomy and facetectomy. 2. Bilateral L3-L4 diskectomy, more than normal to be able to     accommodate the 2 cages of 15 x 26. 3. Pedicle screws from L3-L4. 4. Posterolateral arthrodesis with autograft and BNP at L3-4. 5. Cell Saver, C-arm.  SURGEON:  Hilda Lias, M.D.  ASSISTANT:  Dr. Franky Macho.  CLINICAL HISTORY:  Jimmy Hall is a gentleman who fell several times at work and came to see me because of back pain in relation to both legs. Nevertheless during my physical examination, I found that he has a hyperextension in the upper extremity with weakness and clinically he has a cervical myelopathy.  The patient underwent decompression of the cervical spine and now he is coming to have his lumbar decompression. He has had grade 1 and grade 2 spondylolisthesis with severe stenosis at L3-4.  He and his family knew of the risk and benefits of surgery.  DESCRIPTION OF PROCEDURE:  The patient was taken to the OR and after intubation, he was positioned in a prone manner.  The back was cleaned with DuraPrep.  Drapes were applied.  Because of the size, we were unable to feel any bony structure and we made an incision in the midline right above the iliac crest through the skin, subcutaneous tissue, through a thick adipose tissue.  X-rays showed that the clip was at the level of L3.  It was obvious that the whole edge of L3 was  completely loose.  After we did a retraction, we proceeded with removal of spinous process, lamina. and the facet of L3.  The patient had quite a bit of thickened yellow ligament.  Decompression of the thecal sac was achieved as well at the L3-L4 nerve root.  Then, we started with diskectomy in the left side medially and laterally more than normal, and later on the right side.  The endplate were shaped.  We were able to introduce 2 cages of 15 x 26 with BNP and autograft.  The rest of the space was filled out with a mix of BNP and autograft.  From then on, using the C- arm first in AP view and then a lateral view, we were able to make holes in the pedicles.  Prior to introducing the pedicle screw, we feel all 4 quadrants just to be sure that we were surrounded by bone.  Three screws of 6.5 x 50 and 1 x 6.4 were introduced.  They were kept in place with a rod and capps.  Then a cross-link from right to left was done.  From then on, we went laterally and we removed the lateral periosteum of the facet  of L3-4 and a mix of autograft and BNP was used for arthrodesis.  The area was irrigated.  Vancomycin powder was left in the operative site. The large drain was also left in the epidural space and the wound was closed with Vicryl and Steri-Strips.          ______________________________ Hilda LiasErnesto Austan Hall, M.D.     EB/MEDQ  D:  03/02/2014  T:  03/03/2014  Job:  [id number]

## 2014-03-03 NOTE — Progress Notes (Signed)
Subjective: Patient reports doing well significant improvement preoperative radicular symptoms back pain very sore  Objective: Vital signs in last 24 hours: Temp:  [97.3 F (36.3 C)-97.9 F (36.6 C)] 97.8 F (36.6 C) (05/30 0508) Pulse Rate:  [73-102] 73 (05/30 0508) Resp:  [7-22] 15 (05/30 0806) BP: (117-145)/(62-87) 117/62 mmHg (05/30 0508) SpO2:  [97 %-100 %] 100 % (05/30 0806) Weight:  [116.574 kg (257 lb)] 116.574 kg (257 lb) (05/29 1548)  Intake/Output from previous day: 05/29 0701 - 05/30 0700 In: 3400 [I.V.:3200; Blood:200] Out: 1825 [Urine:1075; Drains:250; Blood:500] Intake/Output this shift: Total I/O In: 480 [P.O.:480] Out: -   awake alert sitting up in a chair incision clean dry and intact neurologically stable  Lab Results: No results found for this basename: WBC, HGB, HCT, PLT,  in the last 72 hours BMET No results found for this basename: NA, K, CL, CO2, GLUCOSE, BUN, CREATININE, CALCIUM,  in the last 72 hours  Studies/Results: Dg Lumbar Spine 2-3 Views  03/02/2014   CLINICAL DATA:  L3-4 fusion.  EXAM: DG C-ARM 61-120 MIN; LUMBAR SPINE - 2-3 VIEW; LUMBAR SPINE - 1 VIEW  TECHNIQUE: Single lateral intraoperative radiograph of the lumbar spine, 2 intraoperative fluoroscopic spot views; radiologist was not present at time of image acquisition.  FLUOROSCOPY TIME:  29 seconds  COMPARISON:  Lumbar spine radiograph December 21, 2013  FINDINGS: Lateral radiograph of the lumbar spine partially obscured by overlying densities, however similar grade 1 L3-4 anterolisthesis, surgical instruments indicates the posterior elements of L3-4 and L4-5.  Intraoperative fluoroscopic spot views demonstrate 2 contiguous lumbar levels which likely reflect L3 and L4, with interbody fusion material ; recommend follow-up lateral radiograph for confirmation.  IMPRESSION: Intraoperative lateral radiograph indicating the posterior elements of L3-4 and L4-5.  Fluoroscopic spot views demonstrating likely  L3-4 PLIF.   Electronically Signed   By: Awilda Metroourtnay  Bloomer   On: 03/02/2014 12:57   Dg Lumbar Spine 1 View  03/02/2014   CLINICAL DATA:  L3-4 fusion.  EXAM: DG C-ARM 61-120 MIN; LUMBAR SPINE - 2-3 VIEW; LUMBAR SPINE - 1 VIEW  TECHNIQUE: Single lateral intraoperative radiograph of the lumbar spine, 2 intraoperative fluoroscopic spot views; radiologist was not present at time of image acquisition.  FLUOROSCOPY TIME:  29 seconds  COMPARISON:  Lumbar spine radiograph December 21, 2013  FINDINGS: Lateral radiograph of the lumbar spine partially obscured by overlying densities, however similar grade 1 L3-4 anterolisthesis, surgical instruments indicates the posterior elements of L3-4 and L4-5.  Intraoperative fluoroscopic spot views demonstrate 2 contiguous lumbar levels which likely reflect L3 and L4, with interbody fusion material ; recommend follow-up lateral radiograph for confirmation.  IMPRESSION: Intraoperative lateral radiograph indicating the posterior elements of L3-4 and L4-5.  Fluoroscopic spot views demonstrating likely L3-4 PLIF.   Electronically Signed   By: Awilda Metroourtnay  Bloomer   On: 03/02/2014 12:57   Dg C-arm 1-60 Min  03/02/2014   CLINICAL DATA:  L3-4 fusion.  EXAM: DG C-ARM 61-120 MIN; LUMBAR SPINE - 2-3 VIEW; LUMBAR SPINE - 1 VIEW  TECHNIQUE: Single lateral intraoperative radiograph of the lumbar spine, 2 intraoperative fluoroscopic spot views; radiologist was not present at time of image acquisition.  FLUOROSCOPY TIME:  29 seconds  COMPARISON:  Lumbar spine radiograph December 21, 2013  FINDINGS: Lateral radiograph of the lumbar spine partially obscured by overlying densities, however similar grade 1 L3-4 anterolisthesis, surgical instruments indicates the posterior elements of L3-4 and L4-5.  Intraoperative fluoroscopic spot views demonstrate 2 contiguous lumbar levels which likely  reflect L3 and L4, with interbody fusion material ; recommend follow-up lateral radiograph for confirmation.  IMPRESSION:  Intraoperative lateral radiograph indicating the posterior elements of L3-4 and L4-5.  Fluoroscopic spot views demonstrating likely L3-4 PLIF.   Electronically Signed   By: Awilda Metro   On: 03/02/2014 12:57    Assessment/Plan: Physical and occupational therapy progressive mobilization DC his Foley   LOS: 1 day     Mariam Dollar 03/03/2014, 9:08 AM

## 2014-03-03 NOTE — Progress Notes (Signed)
Foley catheter removed and patient tolerated well.

## 2014-03-03 NOTE — Progress Notes (Signed)
Occupational Therapy Evaluation and Discharge Patient Details Name: Jimmy Hall MRN: [id number] DOB: 1955-10-28 Today's Date: 03/03/2014    History of Present Illness Pt admitted 5/29 for elective PLIF L3-4. Pt with h/o anterior cervical decompression in march 2015.   Clinical Impression   Pt moving well today following PT session and reports decreased pain with OOB activities. Education and training completed with pt regarding back precautions, safety with ADLs, environmental modifications, and compensatory techniques. Pt reports he will have 24/7 assist at home and has no ADL concerns. Acute OT to sign off.     Follow Up Recommendations  No OT follow up;Supervision/Assistance - 24 hour    Equipment Recommendations  None recommended by OT       Precautions / Restrictions Precautions Precautions: Back Precaution Booklet Issued: Yes (comment) Precaution Comments: Educated pt on 3/3 back precautions and incorporating into ADLs. Restrictions Weight Bearing Restrictions: No      Mobility Bed Mobility Overal bed mobility: Needs Assistance Bed Mobility: Rolling;Sidelying to Sit Rolling: Supervision Sidelying to sit: Supervision       General bed mobility comments: Not assessed- pt in recliner before/after OT session. Per PT note, pt at supervision for bed mobility. Educated pt on log roll.  Transfers Overall transfer level: Needs assistance Equipment used: Rolling walker (2 wheeled) Transfers: Sit to/from Stand Sit to Stand: Min guard         General transfer comment: slow, took 2 attempts, guarded however no physical assist. v/c's for safe hand placement    Balance Overall balance assessment: Needs assistance Sitting-balance support: Feet supported;Single extremity supported Sitting balance-Leahy Scale: Fair     Standing balance support: Bilateral upper extremity supported Standing balance-Leahy Scale: Poor Standing balance comment: requires RW at this time or  minimally single limb support                            ADL Overall ADL's : Needs assistance/impaired Eating/Feeding: Independent;Sitting   Grooming: Oral care;Min guard;Standing   Upper Body Bathing: Set up;Sitting   Lower Body Bathing: Minimal assistance;Sit to/from stand;Adhering to back precautions   Upper Body Dressing : Set up;Sitting   Lower Body Dressing: Sit to/from stand;Adhering to back precautions;Moderate assistance   Toilet Transfer: Min guard;Ambulation   Toileting- Clothing Manipulation and Hygiene: Set up;With adaptive equipment;Adhering to back precautions;Sit to/from stand Toileting - Clothing Manipulation Details (indicate cue type and reason): Educated pt on using baby wipes and pair of tongs for toilet hygiene while maintaining back precautions. Tub/ Shower Transfer: Tub transfer;Ambulation;Rolling walker;Min guard   Functional mobility during ADLs: Min guard;Rolling walker General ADL Comments: Pt is moving well and will likely progress quickly. Pt reports he has 24/7 assist at home and has no ADL concerns. Educaated pt on back precautions and incorporating into ADLs, including environmental modifications and compensatory techniques. Discussed AE and encouraged pt to use pair of tongs and baby wipes for toilet hygiene to maintain back precautions. Demonstrated tub transfer with use of wall to stabilize and stepping over tub safely.      Vision  Per pt report, no change from baseline.                   Perception Perception Perception Tested?: No   Praxis Praxis Praxis tested?: Within functional limits    Pertinent Vitals/Pain Pt c/o "soreness" in back but no pain. Provided pt with ice pack with reported relief.      Hand Dominance  Right   Extremity/Trunk Assessment Upper Extremity Assessment Upper Extremity Assessment: RUE deficits/detail RUE Deficits / Details: Residual weakness from cervical surgery. Elbow flex/ext 5/5,  shoulder FF 2+/5  RUE Coordination: decreased gross motor   Lower Extremity Assessment Lower Extremity Assessment: Defer to PT evaluation   Cervical / Trunk Assessment Cervical / Trunk Assessment: Normal   Communication Communication Communication: No difficulties   Cognition Arousal/Alertness: Awake/alert Behavior During Therapy: WFL for tasks assessed/performed Overall Cognitive Status: Within Functional Limits for tasks assessed                        Exercises Exercises: General Upper Extremity Shoulder Flexion: AROM, Strengthening, Right, 5 reps, seated; with thumb facing up (to ceiling) and elbow locked in extension- encouraged pt to perform throughout the day  Shoulder Abduction: AROM, Strengthening, Right, 5 reps, seated        Home Living Family/patient expects to be discharged to:: Private residence Living Arrangements: Spouse/significant other Available Help at Discharge: Family;Available 24 hours/day Type of Home: Apartment Home Access: Stairs to enter Entrance Stairs-Number of Steps: 5-6 Entrance Stairs-Rails: Left Home Layout: One level     Bathroom Shower/Tub: Chief Strategy OfficerTub/shower unit   Bathroom Toilet: Standard     Home Equipment: Environmental consultantWalker - 2 wheels   Additional Comments: pt reports there is an Engineer, structuralelevator avail      Prior Functioning/Environment Level of Independence: Independent                                       End of Session Equipment Utilized During Treatment: Rolling walker  Activity Tolerance: Patient tolerated treatment well Patient left: in chair;with call bell/phone within reach   Time: 0910-0936 OT Time Calculation (min): 26 min Charges:  OT General Charges $OT Visit: 1 Procedure OT Evaluation $Initial OT Evaluation Tier I: 1 Procedure OT Treatments $Self Care/Home Management : 8-22 mins  Rae LipsLeeann M Toluwanimi Radebaugh [phone] 03/03/2014, 9:45 AM

## 2014-03-03 NOTE — Evaluation (Signed)
Physical Therapy Evaluation Patient Details Name: Jimmy ReichertJoe L Hall MRN: [id number] DOB: 10/06/1955 Today's Date: 03/03/2014   History of Present Illness  Pt admitted 5/29 for elective PLIF L3-4. Pt with h/o anterior cervical decompression in march 2015.  Clinical Impression  Patient is s/p PLIF L3-4 surgery resulting in the deficits listed below (see PT Problem List). Pt tolerated OOB mobility well this date. Patient will benefit from skilled PT to increase their independence and safety with mobility (while adhering to their precautions) to allow discharge home with use of RW and assist from spouse.     Follow Up Recommendations No PT follow up;Supervision/Assistance - 24 hour    Equipment Recommendations  None recommended by PT    Recommendations for Other Services       Precautions / Restrictions Precautions Precautions: Back Precaution Booklet Issued: Yes (comment) Precaution Comments: pt educated on precautions, pt with verbal understanding Restrictions Weight Bearing Restrictions: No      Mobility  Bed Mobility Overal bed mobility: Needs Assistance Bed Mobility: Rolling;Sidelying to Sit Rolling: Supervision Sidelying to sit: Supervision       General bed mobility comments: directional v/c's for log roll technique, definite use of hands and bed rail  Transfers Overall transfer level: Needs assistance Equipment used: Rolling walker (2 wheeled) Transfers: Sit to/from Stand Sit to Stand: Min guard         General transfer comment: slow, took 2 attempts, guarded however no physical assist. v/c's for safe hand placement  Ambulation/Gait Ambulation/Gait assistance: Min guard Ambulation Distance (Feet): 120 Feet Assistive device: Rolling walker (2 wheeled) Gait Pattern/deviations: Step-through pattern;Decreased stride length Gait velocity: guarded   General Gait Details: increased bilat UE weight-bearing, v/c's to minimalize trunk flexion  Stairs             Wheelchair Mobility    Modified Rankin (Stroke Patients Only)       Balance Overall balance assessment: Needs assistance Sitting-balance support: Feet supported;Single extremity supported Sitting balance-Leahy Scale: Fair     Standing balance support: Bilateral upper extremity supported Standing balance-Leahy Scale: Poor Standing balance comment: requires RW at this time or minimally single limb support                             Pertinent Vitals/Pain 8/10 back pain, pt hit pain pump x 1 during session    Home Living Family/patient expects to be discharged to:: Private residence Living Arrangements: Spouse/significant other Available Help at Discharge: Family;Available 24 hours/day Type of Home: Apartment Home Access: Stairs to enter Entrance Stairs-Rails: Left Entrance Stairs-Number of Steps: 5-6 Home Layout: One level Home Equipment: Walker - 2 wheels Additional Comments: pt reports there is an Engineer, structuralelevator avail    Prior Function Level of Independence: Independent               Hand Dominance   Dominant Hand: Right    Extremity/Trunk Assessment   Upper Extremity Assessment: Defer to OT evaluation (residual R UE weakness from previous neck surgery)           Lower Extremity Assessment: Overall WFL for tasks assessed      Cervical / Trunk Assessment: Normal (recent back surgery)  Communication   Communication: No difficulties  Cognition Arousal/Alertness: Awake/alert Behavior During Therapy: WFL for tasks assessed/performed Overall Cognitive Status: Within Functional Limits for tasks assessed  General Comments      Exercises        Assessment/Plan    PT Assessment Patient needs continued PT services  PT Diagnosis Difficulty walking;Acute pain   PT Problem List Decreased strength;Decreased activity tolerance;Decreased mobility;Decreased knowledge of use of DME  PT Treatment Interventions DME  instruction;Gait training;Stair training;Functional mobility training;Therapeutic activities;Therapeutic exercise;Balance training   PT Goals (Current goals can be found in the Care Plan section) Acute Rehab PT Goals Patient Stated Goal: home PT Goal Formulation: With patient Time For Goal Achievement: 03/10/14 Potential to Achieve Goals: Good    Frequency Min 5X/week   Barriers to discharge        Co-evaluation               End of Session Equipment Utilized During Treatment: Gait belt Activity Tolerance: Patient tolerated treatment well Patient left: in chair;with call bell/phone within reach Nurse Communication: Mobility status         Time: 7564-3329 PT Time Calculation (min): 28 min   Charges:   PT Evaluation $Initial PT Evaluation Tier I: 1 Procedure PT Treatments $Gait Training: 8-22 mins   PT G CodesIona Hall 03/03/2014, 9:22 AM  Jimmy Hall, PT, DPT Pager #: [phone] Office #: [phone]

## 2014-03-03 NOTE — Progress Notes (Signed)
Morphine 4mg  wasted from 25mg  piston set up from PACU, waste witnessed by [employer].

## 2014-03-04 NOTE — Progress Notes (Signed)
Patient ID: Jimmy Hall, male   DOB: 08-13-1956, 58 y.o.   MRN: [id number] BP 118/68  Pulse 104  Temp(Src) 98.4 F (36.9 C) (Oral)  Resp 16  Ht 5\' 7"  (1.702 m)  Wt 116.574 kg (257 lb)  BMI 40.24 kg/m2  SpO2 100% Alert and oriented x 4, speech is clear and fluent Moving lower extremities well Dressing cleAn, dry, no drainage. Drain in place Complaining of expected back pain, overall progressing as expected.

## 2014-03-04 NOTE — Progress Notes (Signed)
Physical Therapy Treatment Patient Details Name: Jimmy Hall MRN: [id number] DOB: [birth year] Today's Date: 01-Apr-2014    History of Present Illness Pt admitted 5/29 for elective PLIF L3-4. Pt with h/o anterior cervical decompression in march 2015.    PT Comments    Pt progressing well towards all goals despite 9/10 "soreness" at surgical site. Anticipate pt to be safe for d/c home once medically stable.  Follow Up Recommendations  No PT follow up;Supervision/Assistance - 24 hour     Equipment Recommendations  None recommended by PT    Recommendations for Other Services       Precautions / Restrictions Precautions Precautions: Back Precaution Comments: pt able to recall 1/3 precautions and then required v/c's for arching and twisting Restrictions Weight Bearing Restrictions: No    Mobility  Bed Mobility Overal bed mobility: Modified Independent             General bed mobility comments: definite use of bed rail, increased time  Transfers Overall transfer level: Needs assistance Equipment used: Rolling walker (2 wheeled) Transfers: Sit to/from Stand Sit to Stand: Supervision         General transfer comment: increased time, v/c's for hand placement, bed elevated  Ambulation/Gait Ambulation/Gait assistance: Min guard Ambulation Distance (Feet): 240 Feet Assistive device: Rolling walker (2 wheeled) Gait Pattern/deviations: Step-through pattern Gait velocity: improved from yesterday   General Gait Details: v/c's to relax shoulders and use LEs more and rely on UEs less. pt much improved by end of session   Stairs            Wheelchair Mobility    Modified Rankin (Stroke Patients Only)       Balance     Sitting balance-Leahy Scale: Good       Standing balance-Leahy Scale: Fair                      Cognition Arousal/Alertness: Awake/alert Behavior During Therapy: WFL for tasks assessed/performed Overall Cognitive Status: Within  Functional Limits for tasks assessed                      Exercises      General Comments        Pertinent Vitals/Pain 9/10 surgical site "soreness"    Home Living                      Prior Function            PT Goals (current goals can now be found in the care plan section) Acute Rehab PT Goals Patient Stated Goal: home Progress towards PT goals: Progressing toward goals    Frequency  Min 5X/week    PT Plan Current plan remains appropriate    Co-evaluation             End of Session Equipment Utilized During Treatment: Gait belt Activity Tolerance: Patient tolerated treatment well Patient left: in chair;with call bell/phone within reach     Time: 0807-0832 PT Time Calculation (min): 25 min  Charges:  $Gait Training: 8-22 mins $Therapeutic Activity: 8-22 mins                    G CodesRozell Searing Cloy Cozzens 04-01-2014, 9:54 AM  Lewis Shock, PT, DPT Pager #: [phone] Office #: [phone]

## 2014-03-05 NOTE — Clinical Social Work Note (Signed)
Clinical Social Worker received referral for possible ST-SNF placement.  Chart reviewed.  PT/OT recommending no follow up at this time.    CSW signing off - please re consult if social work needs arise.  Jesse Heide Brossart, LCSW [phone] 

## 2014-03-05 NOTE — Progress Notes (Signed)
Patient ID: Jimmy Hall, male   DOB: 01/28/1956, 58 y.o.   MRN: [id number] C/o incisional pain. No weakness. ot to see

## 2014-03-05 NOTE — Progress Notes (Signed)
Physical Therapy Treatment Patient Details Name: Jimmy Hall MRN: [id number] DOB: Dec 08, 1955 Today's Date: 03-22-14    History of Present Illness Pt admitted 5/29 for elective PLIF L3-4. Pt with h/o anterior cervical decompression in march 2015.    PT Comments    Pt progressing well towards all goals. Pt able to tolerate increased ambulation and stair negotiation this date. Pt safe to d/c home with spouse and use of RW once medically stable.  Follow Up Recommendations  No PT follow up;Supervision/Assistance - 24 hour     Equipment Recommendations  None recommended by PT    Recommendations for Other Services       Precautions / Restrictions Precautions Precautions: Back Precaution Booklet Issued: Yes (comment) Precaution Comments: pt able to recall 3/3 precautions Restrictions Weight Bearing Restrictions: No    Mobility  Bed Mobility Overal bed mobility: Modified Independent             General bed mobility comments: definite use of bed rail, increased time  Transfers Overall transfer level: Needs assistance Equipment used: Rolling walker (2 wheeled) Transfers: Sit to/from Stand Sit to Stand: Supervision         General transfer comment: pt with good transfer technique  Ambulation/Gait Ambulation/Gait assistance: Supervision Ambulation Distance (Feet): 350 Feet Assistive device: Rolling walker (2 wheeled) Gait Pattern/deviations: Step-through pattern     General Gait Details: pt with improved upright posture this date and less dependent on bilat UEs on RW   Stairs Stairs: Yes Stairs assistance: Min guard Stair Management: One rail Left;Step to pattern;Forwards Number of Stairs: 6 General stair comments: pt with safe techique  Wheelchair Mobility    Modified Rankin (Stroke Patients Only)       Balance     Sitting balance-Leahy Scale: Good     Standing balance support: During functional activity (stood at [employer] to urinate) Standing  balance-Leahy Scale: Fair                      Cognition Arousal/Alertness: Awake/alert Behavior During Therapy: WFL for tasks assessed/performed Overall Cognitive Status: Within Functional Limits for tasks assessed                      Exercises      General Comments        Pertinent Vitals/Pain 5/10 surgical back pain    Home Living                      Prior Function            PT Goals (current goals can now be found in the care plan section) Acute Rehab PT Goals Patient Stated Goal: home Progress towards PT goals: Progressing toward goals    Frequency  Min 3X/week    PT Plan Current plan remains appropriate;Frequency needs to be updated    Co-evaluation             End of Session Equipment Utilized During Treatment: Gait belt Activity Tolerance: Patient tolerated treatment well Patient left: in chair;with call bell/phone within reach     Time: 0732-0804 PT Time Calculation (min): 32 min  Charges:  $Gait Training: 8-22 mins $Therapeutic Activity: 8-22 mins                    G Codes:      Jimmy Hall 03/22/2014, 8:38 AM  Lewis Shock, PT, DPT Pager #: [phone] Office #: [phone]

## 2014-03-06 MED ORDER — FLEET ENEMA 7-19 GM/118ML RE ENEM: 1.0000 | ENEMA | Freq: Every day | RECTAL | Status: DC | PRN

## 2014-03-06 MED ORDER — OXYCODONE HCL 5 MG PO TABS
20.0000 mg | ORAL_TABLET | ORAL | Status: DC | PRN
  Administered 2014-03-06 – 2014-03-07 (×6): 20 mg via ORAL
  Filled 2014-03-06 (×6): qty 4

## 2014-03-06 NOTE — Clinical Documentation Improvement (Signed)
THIS DOCUMENT IS NOT A PERMANENT PART OF THE MEDICAL RECORD  This patient has current BMI 40.24. Please address in Notes to reflect severity of illness and risk of mortality. Thank you.  Possible Clinical conditions - Morbid Obesity: BMI = or > 40.0 - Other condition (please specify in Notes)   Reviewed:  no additional documentation provided  Thank You,  Beverley Fiedler  Clinical Documentation Specialist: [phone] Health Information Management [place]

## 2014-03-06 NOTE — Progress Notes (Signed)
Patient ID: Jimmy Hall, male   DOB: [birth year], 58 y.o.   MRN: [id number] C/o incisional pain. No weakness.plan to be off pca

## 2014-03-06 NOTE — Progress Notes (Signed)
CARE MANAGEMENT NOTE 03/06/2014  Patient:  Jimmy Hall, Jimmy Hall   Account Number:  [id number]  Date Initiated:  03/06/2014  Documentation initiated by:  Jiles Crocker  Subjective/Objective Assessment:   ADMITTED FOR SURGERY     Action/Plan:   CM FOLLOWING FOR DCP   Anticipated DC Date:  03/09/2014   Anticipated DC Plan:  HOME/SELF CARE     DC Planning Services  CM consult        Status of service:  In process, will continue to follow Medicare Important Message given?   (If response is "NO", the following Medicare IM given date fields will be blank)  Per UR Regulation:  Reviewed for med. necessity/level of care/duration of stay  Comments:  6/2/2015Abelino Derrick RN,BSN,MHA [phone]

## 2014-03-07 NOTE — Progress Notes (Signed)
Patient is discharged from room 4N19 at this time. Alert and in stable condition. IV site d/c'd and instructions read to patient with understanding verbalized. Left unit via wheelchair with belongings at side.

## 2014-03-07 NOTE — Progress Notes (Signed)
Physical Therapy Treatment Patient Details Name: Jimmy Hall MRN: [id number] DOB: [birth year] Today's Date: 03/07/2014    History of Present Illness Pt admitted 5/29 for elective PLIF L3-4. Pt with h/o anterior cervical decompression in march 2015.    PT Comments    Pt progressing with mobility. Pt is at supervision level for all mobility at this time. Requires min cues during session to adhere to back precautions. Pt with difficulty managing RW at times. Discussed attempting to ambulate next session without RW. Pt safe to D/C home when medically ready from mobility standpoint. Pt did have new c/o Lt UE weakness.   Follow Up Recommendations  No PT follow up;Supervision/Assistance - 24 hour     Equipment Recommendations  None recommended by PT    Recommendations for Other Services       Precautions / Restrictions Precautions Precautions: Back Precaution Comments: pt able to recall 3/3 independently  Restrictions Weight Bearing Restrictions: No    Mobility  Bed Mobility               General bed mobility comments: not assessed; pt up in recliner and returned   Transfers Overall transfer level: Needs assistance Equipment used: Rolling walker (2 wheeled) Transfers: Sit to/from Stand Sit to Stand: Supervision         General transfer comment: cues for hand placement with transfers and cues to adhere to back precautions when controlling descent to chair   Ambulation/Gait Ambulation/Gait assistance: Supervision Ambulation Distance (Feet): 800 Feet Assistive device: Rolling walker (2 wheeled) Gait Pattern/deviations: Step-through pattern;Decreased stride length Gait velocity: decreased due to pain and "soreness" Gait velocity interpretation: Below normal speed for age/gender General Gait Details: cues for safety with RW and for upright posture; pt begins to flex trunkwith fatigue; no balance disturbances noted; pt does have difficulty managing RW around obstacles at  times    Stairs Stairs: Yes Stairs assistance: Supervision Stair Management: One rail Left;Step to pattern;Forwards Number of Stairs: 2 General stair comments: pt demo good recall of technique from previous session; supervision for safety   Wheelchair Mobility    Modified Rankin (Stroke Patients Only)       Balance Overall balance assessment: Needs assistance Sitting-balance support: Feet supported;No upper extremity supported Sitting balance-Leahy Scale: Good     Standing balance support: During functional activity;Bilateral upper extremity supported;No upper extremity supported Standing balance-Leahy Scale: Fair Standing balance comment: is able to stand without UE support and shift weight minimally                     Cognition Arousal/Alertness: Awake/alert Behavior During Therapy: WFL for tasks assessed/performed Overall Cognitive Status: Within Functional Limits for tasks assessed                      Exercises General Exercises - Lower Extremity Ankle Circles/Pumps: AROM;Strengthening;Both;10 reps (to reduce muscle spasms) Long Arc Quad: AROM;Strengthening;Both;10 reps;Seated Hip Flexion/Marching: PROM;Strengthening;Standing;10 reps    General Comments General comments (skin integrity, edema, etc.): pt concerned regarding strength in bil [place]; Lt UE weaker than Rt UE; pt encouraged to ambulate unit with nursing as tolerated      Pertinent Vitals/Pain C/o 6/10 "soreness" at incision site.     Home Living                      Prior Function            PT Goals (current goals can now be found  in the care plan section) Acute Rehab PT Goals Patient Stated Goal: go home today PT Goal Formulation: With patient Time For Goal Achievement: 03/10/14 Potential to Achieve Goals: Good Progress towards PT goals: Progressing toward goals    Frequency  Min 3X/week    PT Plan Current plan remains appropriate    Co-evaluation              End of Session Equipment Utilized During Treatment: Gait belt Activity Tolerance: Patient tolerated treatment well Patient left: in chair;with call bell/phone within reach     Time: 9604-54091128-1154 PT Time Calculation (min): 26 min  Charges:  $Gait Training: 8-22 mins $Therapeutic Exercise: 8-22 mins                    G CodesNadara Mustard:      Yuritzy Zehring N [place], [place]PT  [phone] 03/07/2014, 2:17 PM

## 2014-03-07 NOTE — Discharge Summary (Signed)
Physician Discharge Summary  Patient ID: TYREN FULMER MRN: [id number] DOB/AGE: 1956/02/07 58 y.o.  Admit date: 03/02/2014 Discharge date: 03/07/2014  Admission Diagnoses:l3-4 apondylolisthesis  Discharge Diagnoses:  Active Problems:   Spondylolisthesis of lumbar region   Discharged Condition: no weakness Hospital Course: surgery  Consults: none  Significant Diagnostic Studies: myelogram  Treatments: lumbar fusion  Discharge Exam: Blood pressure 123/82, pulse 99, temperature 98.5 F (36.9 C), temperature source Oral, resp. rate 18, height 5\' 7"  (1.702 m), weight 257 lb (116.574 kg), SpO2 98.00%. No weakness. Some tenderness left shoulder  Disposition: home     Medication List    ASK your doctor about these medications       ibuprofen 200 MG tablet  Commonly known as:  ADVIL,MOTRIN  Take 400 mg by mouth every 6 (six) hours as needed for moderate pain.     oxyCODONE-acetaminophen 5-325 MG per tablet  Commonly known as:  PERCOCET/ROXICET  Take 1-2 tablets by mouth every 4 (four) hours as needed for moderate pain.         Signed: Karn Cassis 03/07/2014, 2:34 PM

## 2014-08-25 ENCOUNTER — Ambulatory Visit (INDEPENDENT_AMBULATORY_CARE_PROVIDER_SITE_OTHER): Payer: BC Managed Care – PPO

## 2014-08-25 DIAGNOSIS — Z23 Encounter for immunization: Secondary | ICD-10-CM

## 2014-11-15 ENCOUNTER — Ambulatory Visit (INDEPENDENT_AMBULATORY_CARE_PROVIDER_SITE_OTHER): Payer: BC Managed Care – PPO | Admitting: Family Medicine

## 2014-11-15 ENCOUNTER — Encounter: Payer: Self-pay | Admitting: Family Medicine

## 2014-11-15 ENCOUNTER — Ambulatory Visit (INDEPENDENT_AMBULATORY_CARE_PROVIDER_SITE_OTHER): Payer: BC Managed Care – PPO

## 2014-11-15 VITALS — BP 136/82 | HR 90 | Temp 98.2°F | Ht 69.5 in | Wt 246.0 lb

## 2014-11-15 DIAGNOSIS — R059 Cough, unspecified: Secondary | ICD-10-CM

## 2014-11-15 DIAGNOSIS — R05 Cough: Secondary | ICD-10-CM

## 2014-11-15 DIAGNOSIS — J209 Acute bronchitis, unspecified: Secondary | ICD-10-CM

## 2014-11-15 DIAGNOSIS — F411 Generalized anxiety disorder: Secondary | ICD-10-CM

## 2014-11-15 MED ORDER — ESCITALOPRAM OXALATE 10 MG PO TABS: 10.0000 mg | ORAL_TABLET | Freq: Every day | ORAL | Status: DC

## 2014-11-15 MED ORDER — LEVOFLOXACIN 500 MG PO TABS: 500.0000 mg | ORAL_TABLET | Freq: Every day | ORAL | Status: DC

## 2014-11-15 MED ORDER — HYDROCODONE-HOMATROPINE 5-1.5 MG/5ML PO SYRP: 5.0000 mL | ORAL_SOLUTION | Freq: Four times a day (QID) | ORAL | Status: DC | PRN

## 2014-11-15 NOTE — Progress Notes (Deleted)
   Subjective:  Patient ID: Jimmy Hall L Hall, male    DOB: 07/10/1956  Age: 59 y.o. MRN: [id number]  CC: Cough   HPI Jimmy Hall presents for cough  Wife is bipolar. Not under control. Makes pt. Very anxious  History Jimmy Hall has a past medical history of Chronic back pain; Seasonal allergies; History of bronchitis; Numbness; Joint pain; and History of gout.   He has past surgical history that includes Hernia repair and Anterior cervical decomp/discectomy fusion (N/A, 12/29/2013).   His family history includes Stroke in his mother.He reports that he has quit smoking. His smoking use included Cigarettes. He started smoking about 40 years ago. He smoked 0.50 packs per day. He has never used smokeless tobacco. He reports that he drinks alcohol. He reports that he does not use illicit drugs.  No current outpatient prescriptions on file prior to visit.   No current facility-administered medications on file prior to visit.    ROS Review of Systems  Objective:  BP 136/82 mmHg  Pulse 90  Temp(Src) 98.2 F (36.8 C) (Oral)  Ht 5' 9.5" (1.765 m)  Wt 246 lb (111.585 kg)  BMI 35.82 kg/m2  BP Readings from Last 3 Encounters:  11/15/14 136/82  03/07/14 123/82  12/31/13 149/91    Wt Readings from Last 3 Encounters:  11/15/14 246 lb (111.585 kg)  03/02/14 257 lb (116.574 kg)  12/29/13 241 lb 4 oz (109.43 kg)     Physical Exam  No results found for: HGBA1C  Lab Results  Component Value Date   WBC 8.7 02/27/2014   HGB 15.2 02/27/2014   HCT 46.6 02/27/2014   PLT 231 02/27/2014   GLUCOSE 93 12/29/2013   TRIG 123 11/14/2013   HDL 49 11/14/2013   LDLCALC 145* 11/14/2013   ALT 20 11/14/2013   AST 15 11/14/2013   NA 137 12/29/2013   K 5.4* 12/29/2013   CL 102 12/29/2013   CREATININE 1.38* 12/29/2013   BUN 18 12/29/2013   CO2 25 12/29/2013   TSH 1.150 11/14/2013   PSA 0.9 11/14/2013    No results found.  Assessment & Plan:   Jimmy Hall was seen today for cough.  Diagnoses and all  orders for this visit:  Cough Orders: -     DG Chest 2 View   I have discontinued Jimmy Hall's oxyCODONE-acetaminophen and ibuprofen.  No orders of the defined types were placed in this encounter.    Comments: ***  Follow-up: No Follow-up on file.  Mechele ClaudeWarren Raygen Dahm, M.D.  Subjective:     Jimmy Hall is a 59 y.o. male who presents for new evaluation and treatment of anxiety disorder. He has the following anxiety symptoms: chest pain, difficulty concentrating, insomnia, irritable and panic attacks. Onset of symptoms was approximately year year ago. Symptoms have been rapidly worsening since that time. He denies current suicidal and homicidal ideation. Family history significant for no psychiatric illness. Risk factors: none. Previous treatment includes none. He complains of the following medication side effects: none. The following portions of the patient's history were reviewed and updated as appropriate: allergies, current medications, past family history, past medical history, past social history, past surgical history and problem list.  Review of Systems {ros - complete:30496}    Objective:    {Exam, Complete:17964}    Assessment:    {Diagnoses; anxiety/depression:15361}. Possible organic contributing causes are: {possible organic causes:15339}.   Plan:    {[phone]}

## 2014-11-15 NOTE — Progress Notes (Signed)
Subjective:  Patient ID: Jimmy ReichertJoe L Ettinger, male    DOB: 02/10/1956  Age: 59 y.o. MRN: [id number]  CC: Cough   HPI Jimmy Hall presents for  Jimmy ReichertJoe L Levitan is a 59 y.o. male who presents for new evaluation and treatment of anxiety disorder. He has the following anxiety symptoms: chest pain, difficulty concentrating, insomnia, irritable and panic attacks. Onset of symptoms was approximately year year ago. Symptoms have been rapidly worsening since that time. He denies current suicidal and homicidal ideation. Family history significant for no psychiatric illness. Risk factors: none. Previous treatment includes none. He complains of the following medication side effects: none.  Cough has been ongoing for approximately a week. There has been moderate shortness of breath. It has not been accompanied by fever chills or sweats. Symptom has been moderate and occasionally productive. The following portions of the patient's history were reviewed and updated as appropriate: allergies, current medications, past family history, past medical history, past social history, past surgical history and problem list.  History [name] has a past medical history of Chronic back pain; Seasonal allergies; History of bronchitis; Numbness; Joint pain; and History of gout.   He has past surgical history that includes Hernia repair and Anterior cervical decomp/discectomy fusion (N/A, 12/29/2013).   His family history includes Stroke in his mother.He reports that he has quit smoking. His smoking use included Cigarettes. He started smoking about 40 years ago. He smoked 0.50 packs per day. He has never used smokeless tobacco. He reports that he drinks alcohol. He reports that he does not use illicit drugs.  No current outpatient prescriptions on file prior to visit.   No current facility-administered medications on file prior to visit.    ROS Review of Systems  Constitutional: Negative for fever, chills, diaphoresis and unexpected  weight change.  HENT: Negative for congestion, hearing loss, rhinorrhea, sore throat and trouble swallowing.   Respiratory: Negative for chest tightness and wheezing.        See history of present illness  Gastrointestinal: Negative for nausea, vomiting, abdominal pain, diarrhea, constipation and abdominal distention.  Endocrine: Negative for cold intolerance and heat intolerance.  Genitourinary: Negative for dysuria, hematuria and flank pain.  Musculoskeletal: Negative for joint swelling and arthralgias.  Skin: Negative for rash.  Neurological: Negative for dizziness and headaches.  Psychiatric/Behavioral: Negative for dysphoric mood, decreased concentration and agitation. The patient is not nervous/anxious.     Objective:  BP 136/82 mmHg  Pulse 90  Temp(Src) 98.2 F (36.8 C) (Oral)  Ht 5' 9.5" (1.765 m)  Wt 246 lb (111.585 kg)  BMI 35.82 kg/m2  BP Readings from Last 3 Encounters:  11/15/14 136/82  03/07/14 123/82  12/31/13 149/91    Wt Readings from Last 3 Encounters:  11/15/14 246 lb (111.585 kg)  03/02/14 257 lb (116.574 kg)  12/29/13 241 lb 4 oz (109.43 kg)     Physical Exam  Constitutional: He is oriented to person, place, and time. He appears well-developed and well-nourished. No distress.  HENT:  Head: Normocephalic and atraumatic.  Right Ear: External ear normal.  Left Ear: External ear normal.  Nose: Nose normal.  Mouth/Throat: Oropharynx is clear and moist.  Eyes: Conjunctivae and EOM are normal. Pupils are equal, round, and reactive to light.  Neck: Normal range of motion. Neck supple. No thyromegaly present.  Cardiovascular: Normal rate, regular rhythm and normal heart sounds.   No murmur heard. Pulmonary/Chest: Effort normal and breath sounds normal. No respiratory distress. He has no wheezes.  He has no rales.  Abdominal: Soft. Bowel sounds are normal. He exhibits no distension. There is no tenderness.  Lymphadenopathy:    He has no cervical adenopathy.   Neurological: He is alert and oriented to person, place, and time. He has normal reflexes.  Skin: Skin is warm and dry.  Psychiatric: He has a normal mood and affect. His behavior is normal. Judgment and thought content normal.    No results found for: HGBA1C  Lab Results  Component Value Date   WBC 8.7 02/27/2014   HGB 15.2 02/27/2014   HCT 46.6 02/27/2014   PLT 231 02/27/2014   GLUCOSE 93 12/29/2013   TRIG 123 11/14/2013   HDL 49 11/14/2013   LDLCALC 145* 11/14/2013   ALT 20 11/14/2013   AST 15 11/14/2013   NA 137 12/29/2013   K 5.4* 12/29/2013   CL 102 12/29/2013   CREATININE 1.38* 12/29/2013   BUN 18 12/29/2013   CO2 25 12/29/2013   TSH 1.150 11/14/2013   PSA 0.9 11/14/2013    No results found.  Assessment & Plan:   Jami was seen today for cough.  Diagnoses and all orders for this visit:  Cough Orders: -     DG Chest 2 View  Acute bronchitis, unspecified organism  Generalized anxiety disorder  Other orders -     levofloxacin (LEVAQUIN) 500 MG tablet; Take 1 tablet (500 mg total) by mouth daily. -     HYDROcodone-homatropine (HYCODAN) 5-1.5 MG/5ML syrup; Take 5 mLs by mouth every 6 (six) hours as needed for cough. -     escitalopram (LEXAPRO) 10 MG tablet; Take 1 tablet (10 mg total) by mouth daily.   I have discontinued [name] oxyCODONE-acetaminophen and ibuprofen. I am also having him start on levofloxacin, HYDROcodone-homatropine, and escitalopram.  Meds ordered this encounter  Medications  . levofloxacin (LEVAQUIN) 500 MG tablet    Sig: Take 1 tablet (500 mg total) by mouth daily.    Dispense:  7 tablet    Refill:  0  . HYDROcodone-homatropine (HYCODAN) 5-1.5 MG/5ML syrup    Sig: Take 5 mLs by mouth every 6 (six) hours as needed for cough.    Dispense:  120 mL    Refill:  0  . escitalopram (LEXAPRO) 10 MG tablet    Sig: Take 1 tablet (10 mg total) by mouth daily.    Dispense:  30 tablet    Refill:  2    Comments: Normal chest xray- no  masses , no infiltrates, no effusions or edema heart not enlarged- Preliminary reading by Mechele Claude, MD   Follow-up: Return in about 1 month (around 12/14/2014).  Mechele Claude, M.D.

## 2014-12-13 ENCOUNTER — Emergency Department (HOSPITAL_COMMUNITY): Payer: BC Managed Care – PPO

## 2014-12-13 ENCOUNTER — Encounter (HOSPITAL_COMMUNITY): Payer: Self-pay | Admitting: Emergency Medicine

## 2014-12-13 ENCOUNTER — Emergency Department (HOSPITAL_COMMUNITY)
Admission: EM | Admit: 2014-12-13 | Discharge: 2014-12-13 | Disposition: A | Discharge status: Home / Self Care | Payer: BC Managed Care – PPO | Attending: Emergency Medicine | Admitting: Emergency Medicine

## 2014-12-13 DIAGNOSIS — Z8739 Personal history of other diseases of the musculoskeletal system and connective tissue: Secondary | ICD-10-CM | POA: Diagnosis not present

## 2014-12-13 DIAGNOSIS — G8929 Other chronic pain: Secondary | ICD-10-CM | POA: Insufficient documentation

## 2014-12-13 DIAGNOSIS — J40 Bronchitis, not specified as acute or chronic: Secondary | ICD-10-CM

## 2014-12-13 DIAGNOSIS — Z792 Long term (current) use of antibiotics: Secondary | ICD-10-CM | POA: Insufficient documentation

## 2014-12-13 DIAGNOSIS — R05 Cough: Secondary | ICD-10-CM | POA: Diagnosis present

## 2014-12-13 DIAGNOSIS — Z87891 Personal history of nicotine dependence: Secondary | ICD-10-CM | POA: Diagnosis not present

## 2014-12-13 MED ORDER — PREDNISONE 20 MG PO TABS
40.0000 mg | ORAL_TABLET | Freq: Once | ORAL | Status: AC
  Administered 2014-12-13: 40 mg via ORAL
  Filled 2014-12-13: qty 2

## 2014-12-13 MED ORDER — HYDROCOD POLST-CHLORPHEN POLST 10-8 MG/5ML PO LQCR: 5.0000 mL | Freq: Two times a day (BID) | ORAL | Status: DC | PRN

## 2014-12-13 MED ORDER — ALBUTEROL SULFATE HFA 108 (90 BASE) MCG/ACT IN AERS
2.0000 | INHALATION_SPRAY | RESPIRATORY_TRACT | Status: DC | PRN
  Administered 2014-12-13: 2 via RESPIRATORY_TRACT
  Filled 2014-12-13: qty 6.7

## 2014-12-13 MED ORDER — PREDNISONE 20 MG PO TABS: 40.0000 mg | ORAL_TABLET | Freq: Every day | ORAL | Status: DC

## 2014-12-13 MED ORDER — IPRATROPIUM-ALBUTEROL 0.5-2.5 (3) MG/3ML IN SOLN
3.0000 mL | Freq: Once | RESPIRATORY_TRACT | Status: AC
  Administered 2014-12-13: 3 mL via RESPIRATORY_TRACT
  Filled 2014-12-13: qty 3

## 2014-12-13 NOTE — ED Notes (Signed)
Patient c/o productive cough x2 months. Per patient thick yellow sputum. Patient seen by PCP a month ago and given antibiotics. Per patient cleared up while taking antibiotics and returned 2 days after finishing his 7 day course. Patient also reports having chest x-ray that looked normal. Patient denies any fevers.

## 2014-12-13 NOTE — Discharge Instructions (Signed)
Take the medications as directed. Call and schedule an appointment with your regular doctor for follow up. He may want to send to the a pulmonary doctor if your symptoms persist.

## 2014-12-13 NOTE — ED Provider Notes (Signed)
CSN: [id number]     Arrival date & time 12/13/14  1722 History   First MD Initiated Contact with Patient 12/13/14 1853     Chief Complaint  Patient presents with  . Cough     (Consider location/radiation/quality/duration/timing/severity/associated sxs/prior Treatment) Patient is a 59 y.o. male presenting with cough. The history is provided by the patient.  Cough Cough characteristics:  Productive Sputum characteristics:  Yellow Severity:  Moderate Onset quality:  Gradual Duration:  8 weeks Progression:  Worsening Smoker: former.   Context: sick contacts   Relieved by:  Nothing Worsened by:  Activity Ineffective treatments:  Decongestant Associated symptoms: wheezing   Associated symptoms: no chills, no ear pain, no eye discharge, no fever, no myalgias and no rhinorrhea    Jimmy ReichertJoe L Hall is a 59 y.o. male who presents to the ED with cough and congestion. He reports that a couple months ago he saw his doctor and was treated for bronchitis with Hycodan and Levaquin for infection. He got better for a couple days but then the symptoms returned. He is taking OTC medications for cough and congestion without relief.   Past Medical History  Diagnosis Date  . Chronic back pain     spondylolisthesis  . Seasonal allergies     takes OTC meds maybe once a week  . History of bronchitis     6651yrs ago and only one time  . Numbness     left 4th and 5th finger  . Joint pain     knees  . History of gout     not on any meds   Past Surgical History  Procedure Laterality Date  . Hernia repair    . Anterior cervical decomp/discectomy fusion N/A 12/29/2013    Procedure: ANTERIOR CERVICAL DECOMPRESSION/DISCECTOMY FUSION 3 LEVELS;  Surgeon: Karn CassisErnesto M Botero, MD;  Location: [facility];  Service: Neurosurgery;  Laterality: N/A;  Anterior Cervical Three-Four/Four-Five/Five-Six  Decompression/Diskectomy/Fusion   Family History  Problem Relation Age of Onset  . Stroke Mother    History  Substance  Use Topics  . Smoking status: Former Smoker -- 0.50 packs/day for 6 years    Types: Cigarettes    Start date: 03/07/1974    Quit date: 09/24/1991  . Smokeless tobacco: Never Used  . Alcohol Use: Yes     Comment: beer occasionally    Review of Systems  Constitutional: Negative for fever and chills.  HENT: Negative for ear pain and rhinorrhea.   Eyes: Negative for discharge.  Respiratory: Positive for cough and wheezing.   Musculoskeletal: Negative for myalgias.  all other systems negative    Allergies  Review of patient's allergies indicates no known allergies.  Home Medications   Prior to Admission medications   Medication Sig Start Date End Date Taking? Authorizing Provider  escitalopram (LEXAPRO) 10 MG tablet Take 1 tablet (10 mg total) by mouth daily. 11/15/14   Mechele ClaudeWarren Stacks, MD  HYDROcodone-homatropine [facility](HYCODAN) 5-1.5 MG/5ML syrup Take 5 mLs by mouth every 6 (six) hours as needed for cough. 11/15/14   Mechele ClaudeWarren Stacks, MD  levofloxacin (LEVAQUIN) 500 MG tablet Take 1 tablet (500 mg total) by mouth daily. 11/15/14   Mechele ClaudeWarren Stacks, MD   BP 140/83 mmHg  Pulse 77  Temp(Src) 98 F (36.7 C) (Oral)  Resp 20  Ht 5\' 9"  (1.753 m)  Wt 246 lb (111.585 kg)  BMI 36.31 kg/m2  SpO2 100% Physical Exam  Constitutional: He is oriented to person, place, and time. He appears well-developed and well-nourished.  HENT:  Head: Normocephalic.  Eyes: Conjunctivae and EOM are normal.  Neck: Neck supple.  Cardiovascular: Normal rate and regular rhythm.   Pulmonary/Chest: Effort normal and breath sounds normal. He has no wheezes. He has no rales.  Abdominal: Soft. Bowel sounds are normal. There is no tenderness.  Musculoskeletal: Normal range of motion.  Neurological: He is alert and oriented to person, place, and time. No cranial nerve deficit.  Skin: Skin is warm and dry.  Psychiatric: He has a normal mood and affect. His behavior is normal.  Nursing note and vitals reviewed.   ED Course   Procedures (including critical care time) Labs Review X-ray results reviewed with Dr. Juleen China will treat for bronchitis and patient is to f/u with PCP. Imaging Review Dg Chest 2 View  12/13/2014   CLINICAL DATA:  COUGH X 2 MTHS. PRODUCTIVE. NO PREV SURG TO CHEST. ASTHMA.  EXAM: CHEST - 2 VIEW  COMPARISON:  11/15/2014  FINDINGS: Mild interstitial prominence since previous exam. Central peribronchial thickening. No focal airspace disease. Heart size normal. No pneumothorax. No effusion. Visualized skeletal structures are unremarkable.  IMPRESSION: 1. Peribronchial thickening with perihilar interstitial prominence suggesting mild interstitial edema or early infiltrate versus viral syndrome or asthma.   Electronically Signed   By: Corlis Leak M.D.   On: 12/13/2014 19:07    MDM  59 y.o. male with productive cough x 2 months. Will treat for bronchitis with Tussionex and prednisone. Discussed with the patient clinical and x-ray findings and plan of care. All questioned fully answered. He will follow up with his PCP or return here if any problems arise. Stable for d/c without respiratory distress, O2 SAT 99% on R/A.      [address] [place], [place] 12/17/14 1610  Raeford Razor, MD 12/17/14 [phone]

## 2015-05-08 ENCOUNTER — Ambulatory Visit (INDEPENDENT_AMBULATORY_CARE_PROVIDER_SITE_OTHER): Payer: BC Managed Care – PPO

## 2015-05-08 ENCOUNTER — Ambulatory Visit (INDEPENDENT_AMBULATORY_CARE_PROVIDER_SITE_OTHER): Payer: BC Managed Care – PPO | Admitting: Family Medicine

## 2015-05-08 ENCOUNTER — Encounter: Payer: Self-pay | Admitting: Family Medicine

## 2015-05-08 VITALS — BP 135/85 | HR 76 | Temp 98.1°F | Ht 69.0 in | Wt 247.0 lb

## 2015-05-08 DIAGNOSIS — J40 Bronchitis, not specified as acute or chronic: Secondary | ICD-10-CM

## 2015-05-08 DIAGNOSIS — J301 Allergic rhinitis due to pollen: Secondary | ICD-10-CM | POA: Diagnosis not present

## 2015-05-08 DIAGNOSIS — R059 Cough, unspecified: Secondary | ICD-10-CM

## 2015-05-08 DIAGNOSIS — E669 Obesity, unspecified: Secondary | ICD-10-CM | POA: Diagnosis not present

## 2015-05-08 DIAGNOSIS — R05 Cough: Secondary | ICD-10-CM

## 2015-05-08 DIAGNOSIS — I700 Atherosclerosis of aorta: Secondary | ICD-10-CM | POA: Diagnosis not present

## 2015-05-08 DIAGNOSIS — J209 Acute bronchitis, unspecified: Secondary | ICD-10-CM

## 2015-05-08 LAB — POCT CBC
GRANULOCYTE PERCENT: 68.5 % (ref 37–80)
HEMATOCRIT: 44.6 % (ref 43.5–53.7)
HEMOGLOBIN: 14.2 g/dL (ref 14.1–18.1)
LYMPH, POC: 2.5 (ref 0.6–3.4)
MCH: 27.1 pg (ref 27–31.2)
MCHC: 31.9 g/dL (ref 31.8–35.4)
MCV: 85.2 fL (ref 80–97)
MPV: 8 fL (ref 0–99.8)
PLATELET COUNT, POC: 248 10*3/uL (ref 142–424)
POC Granulocyte: 7.7 — AB (ref 2–6.9)
POC LYMPH PERCENT: 22.7 %L (ref 10–50)
RBC: 5.23 M/uL (ref 4.69–6.13)
RDW, POC: 14.4 %
WBC: 11.2 10*3/uL — AB (ref 4.6–10.2)

## 2015-05-08 MED ORDER — AZITHROMYCIN 250 MG PO TABS: ORAL_TABLET | ORAL | Status: DC

## 2015-05-08 MED ORDER — PREDNISONE 10 MG PO TABS: ORAL_TABLET | ORAL | Status: DC

## 2015-05-08 MED ORDER — METHYLPREDNISOLONE ACETATE 80 MG/ML IJ SUSP
60.0000 mg | Freq: Once | INTRAMUSCULAR | Status: AC
  Administered 2015-05-08: 60 mg via INTRAMUSCULAR

## 2015-05-08 NOTE — Progress Notes (Signed)
Subjective:    Patient ID: Jimmy Hall, male    DOB: 09-02-1956, 59 y.o.   MRN: [id number]  HPI   Patient here today for cough that started approximately one month ago. This patient has had problems off and on since January with cough congestion and wheezing and has been in at least a couple times to get treated for this. He currently uses only an albuterol inhaler as needed. His breathing is always worse at nighttime with cough and wheezing.     Patient Active Problem List   Diagnosis Date Noted  . Spondylolisthesis of lumbar region 03/02/2014  . Cervical stenosis of spinal canal 12/29/2013   Outpatient Encounter Prescriptions as of 05/08/2015  Medication Sig  . [DISCONTINUED] chlorpheniramine-HYDROcodone (TUSSIONEX PENNKINETIC ER) 10-8 MG/5ML LQCR Take 5 mLs by mouth every 12 (twelve) hours as needed for cough.  . [DISCONTINUED] escitalopram (LEXAPRO) 10 MG tablet Take 1 tablet (10 mg total) by mouth daily.  . [DISCONTINUED] HYDROcodone-homatropine (HYCODAN) 5-1.5 MG/5ML syrup Take 5 mLs by mouth every 6 (six) hours as needed for cough.  . [DISCONTINUED] levofloxacin (LEVAQUIN) 500 MG tablet Take 1 tablet (500 mg total) by mouth daily.  . [DISCONTINUED] predniSONE (DELTASONE) 20 MG tablet Take 2 tablets (40 mg total) by mouth daily with breakfast.   No facility-administered encounter medications on file as of 05/08/2015.        Review of Systems  Respiratory: Positive for shortness of breath. Cough: began one month ago, worsens at night.        Objective:   Physical Exam  Constitutional: He is oriented to person, place, and time. He appears well-developed and well-nourished. No distress.  HENT:  Head: Normocephalic and atraumatic.  Right Ear: External ear normal.  Left Ear: External ear normal.  Mouth/Throat: Oropharynx is clear and moist. No oropharyngeal exudate.  Nasal congestion bilaterally  Eyes: Conjunctivae and EOM are normal. Pupils are equal, round, and reactive  to light. Right eye exhibits no discharge. Left eye exhibits no discharge. No scleral icterus.  Neck: Normal range of motion. Neck supple. No thyromegaly present.  No anterior cervical nodes or carotid bruits  Cardiovascular: Normal rate, regular rhythm and normal heart sounds.   No murmur heard. Pulmonary/Chest: Effort normal. No respiratory distress. He has wheezes. He has no rales. He exhibits no tenderness.  The patient has expiratory wheezes and increased congestion with coughing with tightness  Abdominal: He exhibits no mass.  Musculoskeletal: Normal range of motion. He exhibits no edema.  Lymphadenopathy:    He has no cervical adenopathy.  Neurological: He is alert and oriented to person, place, and time.  Skin: Skin is warm and dry. No rash noted.  Psychiatric: He has a normal mood and affect. His behavior is normal. Judgment and thought content normal.  Nursing note and vitals reviewed.   BP 135/85 mmHg  Pulse 76  Temp(Src) 98.1 F (36.7 C) (Oral)  Ht 5\' 9"  (1.753 m)  Wt 247 lb (112.038 kg)  BMI 36.46 kg/m2 [facility] reading (PRIMARY) by  DM[facility]ore-S x-ray no active disease, atherosclerosis of the thoracic aorta                                         Assessment & Plan:  1. Cough -Take Mucinex, blue and white in color, maximum strength 1 twice daily for cough and congestion -Use inhaler regularly 1 puff daily and  rinse mouth after using and use rescue inhaler as needed for wheezing - DG Chest 2 View; Future - POCT CBC - methylPREDNISolone acetate (DEPO-MEDROL) injection 60 mg; Inject 0.75 mLs (60 mg total) into the muscle once.  2. Bronchitis with bronchospasm -Take antibiotic as directed  3. Allergic rhinitis due to pollen -Use Flonase nasal inhaler 1 spray each nostril at bedtime  4. Obesity -Work on [employer] and diet as aggressively as possible  5. Thoracic aortic atherosclerosis -Check cholesterol and possible with fasting lab work  Meds ordered this encounter    Medications  . predniSONE (DELTASONE) 10 MG tablet    Sig: TAPER- 1 tab by mouth four times daily for 2 days, 1 tab by mouth three times daily for 2 days, 1 tab by mouth twice a day for 2 days, then 1 tab by mouth daily for 2 days, then stop.    Dispense:  20 tablet    Refill:  0  . azithromycin (ZITHROMAX) 250 MG tablet    Sig: As directed    Dispense:  6 tablet    Refill:  0  . methylPREDNISolone acetate (DEPO-MEDROL) injection 60 mg    Sig:    Patient Instructions  Drink plenty of fluids Use Flonase nasal inhaler 1 spray each nostril at bedtime Use saline inhaler each nostril 3 or 4 times daily Take Mucinex maximum strength, blue and white in color, 1 twice daily for cough and congestion with a large glass of water Use the albuterol inhaler every 4-6 hours as needed for a rescue inhaler only Use the Brio inhaler 1 puff daily and make sure you rinse your mouth out after using it. Take the Z-Pak as directed If possible remove the cat from the in house environment The patient needs to have blood work to check his cholesterol   Nyra Capes MD

## 2015-05-08 NOTE — Patient Instructions (Addendum)
Drink plenty of fluids Use Flonase nasal inhaler 1 spray each nostril at bedtime Use saline inhaler each nostril 3 or 4 times daily Take Mucinex maximum strength, blue and white in color, 1 twice daily for cough and congestion with a large glass of water Use the albuterol inhaler every 4-6 hours as needed for a rescue inhaler only Use the Brio inhaler 1 puff daily and make sure you rinse your mouth out after using it. Take the Z-Pak as directed If possible remove the cat from the in house environment The patient needs to have blood work to check his cholesterol

## 2015-05-10 ENCOUNTER — Telehealth: Payer: Self-pay | Admitting: Family Medicine

## 2015-05-10 NOTE — Telephone Encounter (Signed)
Patient aware of CXR and CBC results.

## 2015-05-22 ENCOUNTER — Ambulatory Visit: Payer: BC Managed Care – PPO | Admitting: Family Medicine

## 2015-05-23 ENCOUNTER — Encounter: Payer: Self-pay | Admitting: Family Medicine

## 2015-07-06 IMAGING — CR DG KNEE 1-2V*L*
2 series · 2 of 2 positions shown · non-contrast
Comparison: Concurrently obtained radiographs of the contralateral
right knee

CLINICAL DATA: Knee pain

EXAM:
LEFT KNEE - 1-2 VIEW

[view not recorded (1 of 2)]
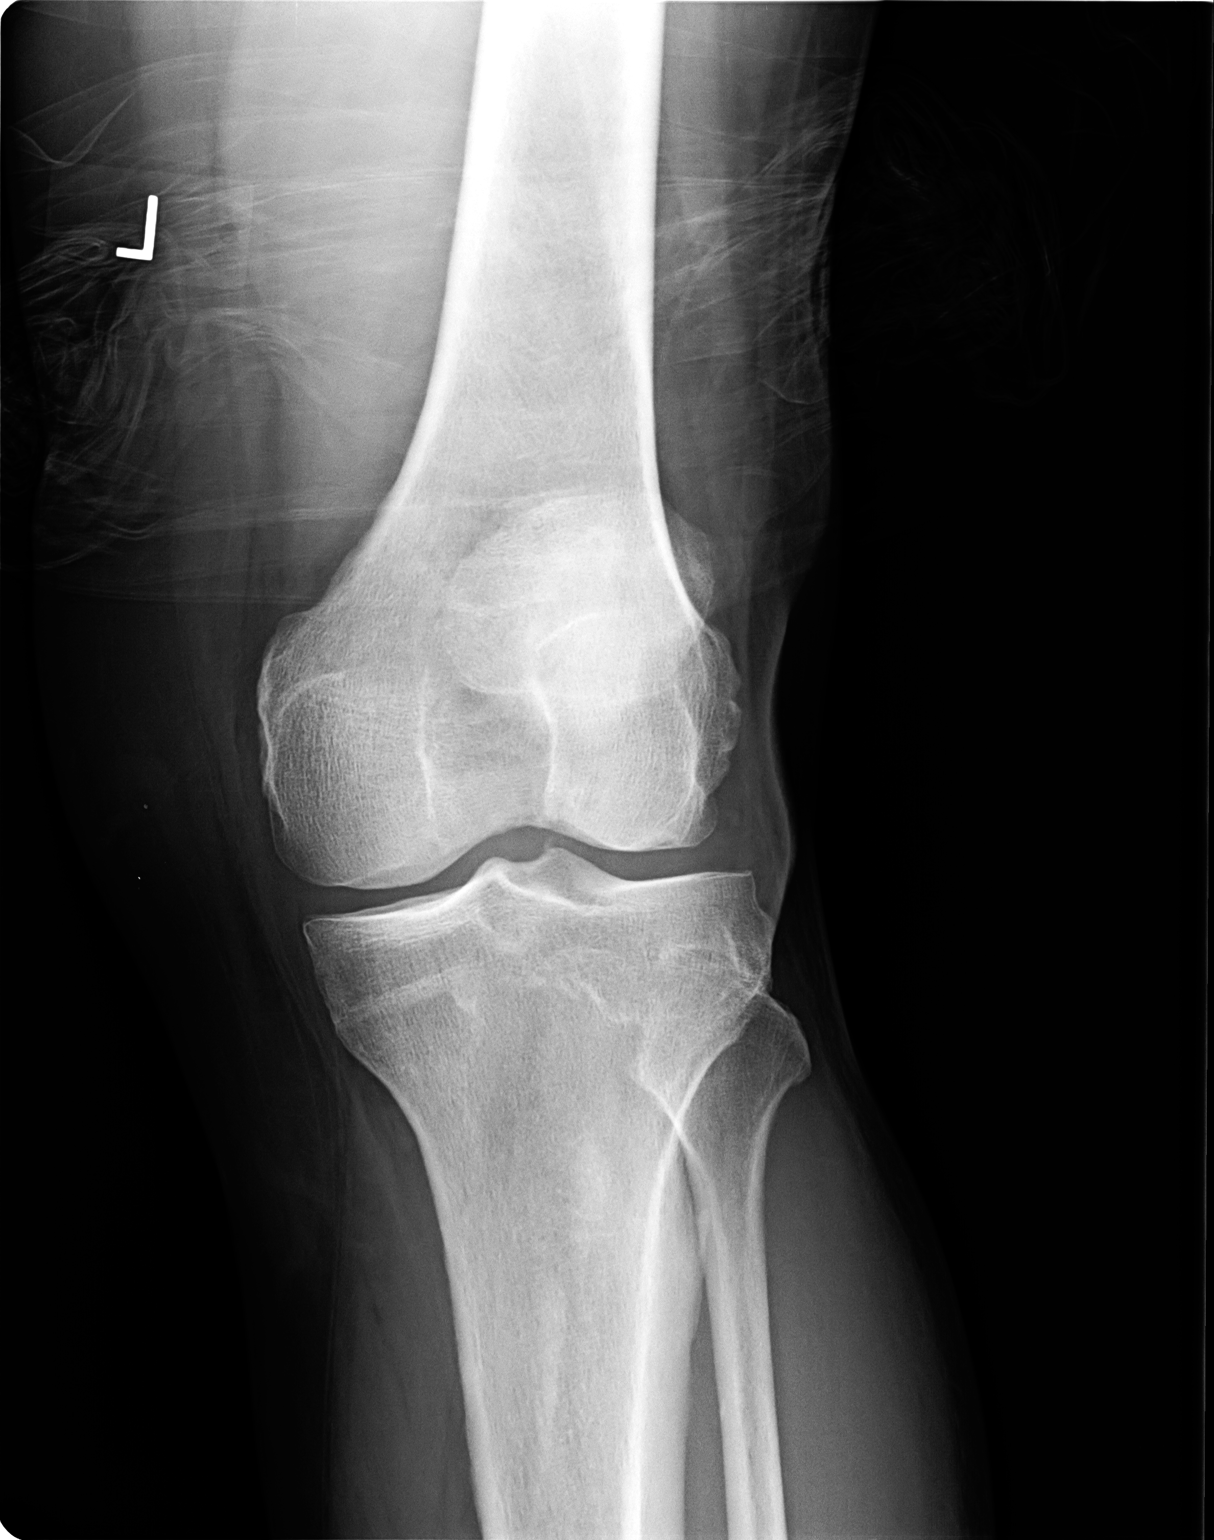

[view not recorded (2 of 2)]
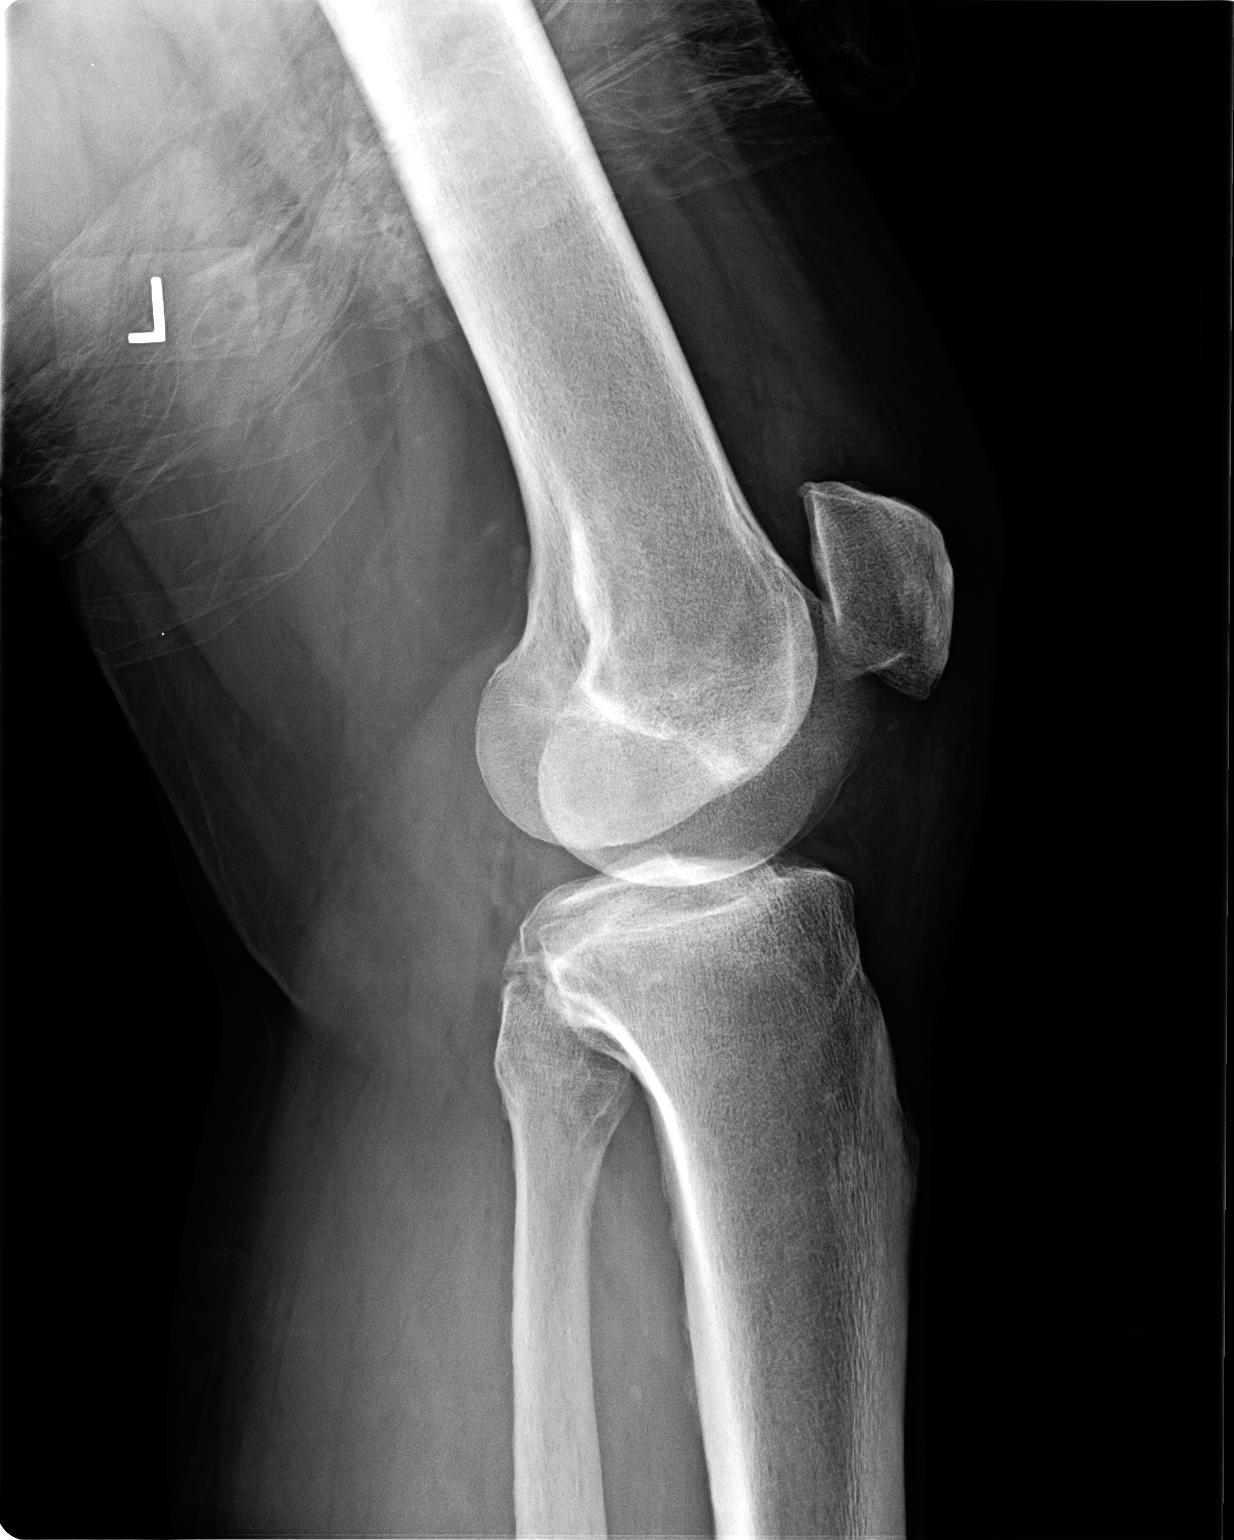

[2 of 2 positions shown; findings below may reference images not displayed]

FINDINGS: There is no evidence of fracture, dislocation, or joint effusion. No
focal bone abnormality. Mild tricompartmental degenerative changes
slightly worse in the medial compartment. Soft tissues are
unremarkable.
IMPRESSION: 1. No acute osseous abnormality.
2. Mild tricompartmental degenerative changes slightly worse in the
medial compartment.

## 2017-02-21 ENCOUNTER — Emergency Department (HOSPITAL_COMMUNITY)
Admission: EM | Admit: 2017-02-21 | Discharge: 2017-02-21 | Disposition: A | Discharge status: Home / Self Care | Payer: BC Managed Care – PPO | Attending: Emergency Medicine | Admitting: Emergency Medicine

## 2017-02-21 ENCOUNTER — Encounter (HOSPITAL_COMMUNITY): Payer: Self-pay | Admitting: General Practice

## 2017-02-21 DIAGNOSIS — M542 Cervicalgia: Secondary | ICD-10-CM | POA: Diagnosis present

## 2017-02-21 DIAGNOSIS — Z87891 Personal history of nicotine dependence: Secondary | ICD-10-CM | POA: Diagnosis not present

## 2017-02-21 DIAGNOSIS — M5412 Radiculopathy, cervical region: Secondary | ICD-10-CM | POA: Diagnosis not present

## 2017-02-21 MED ORDER — HYDROCODONE-ACETAMINOPHEN 5-325 MG PO TABS: 1.0000 | ORAL_TABLET | Freq: Four times a day (QID) | ORAL | 0 refills | Status: DC | PRN

## 2017-02-21 NOTE — ED Provider Notes (Signed)
[facility] DEPT Provider Note   CSN: [id number] Arrival date & time: 02/21/17  1801     History   Chief Complaint Chief Complaint  Patient presents with  . Shoulder Pain    HPI Jimmy Hall is a 61 y.o. male.Complains of pain radiating from left posterior lateral neck and shoulder and sometimes to fourth and fifth fingers of left hand onset 1.5 weeks ago. Pain is constant. Nothing makes symptoms better or worse, severe, he is treated himself with Advil and Aleve, without relief. No fever no trauma no chest pain no shortness of breath no other associated symptoms denies we weakness in arm or hand. No other associated symptoms. No numbness  HPI  Past Medical History:  Diagnosis Date  . Chronic back pain    spondylolisthesis  . History of bronchitis    95yrs ago and only one time  . History of gout    not on any meds  . Joint pain    knees  . Numbness    left 4th and 5th finger  . Seasonal allergies    takes OTC meds maybe once a week    Patient Active Problem List   Diagnosis Date Noted  . Spondylolisthesis of lumbar region 03/02/2014  . Cervical stenosis of spinal canal 12/29/2013    Past Surgical History:  Procedure Laterality Date  . ANTERIOR CERVICAL DECOMP/DISCECTOMY FUSION N/A 12/29/2013   Procedure: ANTERIOR CERVICAL DECOMPRESSION/DISCECTOMY FUSION 3 LEVELS;  Surgeon: Karn Cassis, MD;  Location: [facility];  Service: Neurosurgery;  Laterality: N/A;  Anterior Cervical Three-Four/Four-Five/Five-Six  Decompression/Diskectomy/Fusion  . HERNIA REPAIR         Home Medications    Prior to Admission medications   Medication Sig Start Date End Date Taking? Authorizing Provider  azithromycin (ZITHROMAX) 250 MG tablet As directed 05/08/15   Ernestina Penna, MD  predniSONE (DELTASONE) 10 MG tablet TAPER- 1 tab by mouth four times daily for 2 days, 1 tab by mouth three times daily for 2 days, 1 tab by mouth twice a day for 2 days, then 1 tab by mouth daily for  2 days, then stop. 05/08/15   Ernestina Penna, MD    Family History Family History  Problem Relation Age of Onset  . Stroke Mother     Social History Social History  Substance Use Topics  . Smoking status: Former Smoker    Packs/day: 0.50    Years: 6.00    Types: Cigarettes    Start date: 03/07/1974    Quit date: 09/24/1991  . Smokeless tobacco: Never Used  . Alcohol use Yes     Comment: beer occasionally   No illicit drug use  Allergies   Patient has no known allergies.   Review of Systems Review of Systems  Constitutional: Negative.   HENT: Negative.   Respiratory: Negative.   Cardiovascular: Negative.   Gastrointestinal: Negative.   Musculoskeletal: Positive for arthralgias and neck pain.  Skin: Negative.   Neurological: Negative.   Psychiatric/Behavioral: Negative.   All other systems reviewed and are negative.    Physical Exam Updated Vital Signs BP 140/85 (BP Location: Right Arm)   Pulse 86   Temp 98.6 F (37 C) (Oral)   Resp 18   Ht 5\' 9"  (1.753 m)   Wt 250 lb (113.4 kg)   SpO2 97%   BMI 36.92 kg/m   Physical Exam  Constitutional: He appears well-developed and well-nourished.  HENT:  Head: Normocephalic and atraumatic.  Eyes: Conjunctivae  are normal. Pupils are equal, round, and reactive to light.  Neck: Neck supple. No tracheal deviation present. No thyromegaly present.  No tenderness no bruit  Cardiovascular: Normal rate, regular rhythm, normal heart sounds and intact distal pulses.   No murmur heard. Pulmonary/Chest: Effort normal and breath sounds normal.  Abdominal: Soft. Bowel sounds are normal. He exhibits no distension. There is no tenderness.  Musculoskeletal: Normal range of motion. He exhibits no edema or tenderness.  Left upper extremity no redness no warmth no swelling no muscular atrophy for range of motion motor strength 5 over 5 radial pulse 2+.  Neurological: He is alert. Coordination normal.  Skin: Skin is warm and dry. No  rash noted.  Psychiatric: He has a normal mood and affect.  Nursing note and vitals reviewed.    ED Treatments / Results  Labs (all labs ordered are listed, but only abnormal results are displayed) Labs Reviewed - No data to display  EKG  EKG Interpretation  Date/Time:  Sunday Feb 21 2017 18:17:57 EDT Ventricular Rate:  88 PR Interval:  160 QRS Duration: 74 QT Interval:  350 QTC Calculation: 423 R Axis:   -14 Text Interpretation:  Normal sinus rhythm Left ventricular hypertrophy Nonspecific ST and T wave abnormality Abnormal ECG No significant change since last tracing Confirmed by Ethelda ChickJACUBOWITZ  MD, Kymiah Araiza [phone] on 02/21/2017 6:26:30 PM       Radiology No results found.  Procedures Procedures (including critical care time)  Medications Ordered in ED Medications - No data to display   Initial Impression / Assessment and Plan / ED Course  I have reviewed the triage vital signs and the nursing notes.  Pertinent labs & imaging results that were available during my care of the patient were reviewed by me and considered in my medical decision making (see chart for details).     Emergent imaging not indicated. I told patient he may need MRI if symptoms persist he should follow up with his primary care physician if symptoms persist in a week. Prescription [facility] [place] [facility] queried Final Clinical Impressions(s) / ED Diagnoses  Diagnosis cervical radiculopathy Final diagnoses:  None    New Prescriptions New Prescriptions   No medications on file     Doug SouJacubowitz, Madonna Flegal, MD 02/21/17 [phone]

## 2017-02-21 NOTE — Discharge Instructions (Signed)
Take Tylenol or Advil for mild pain or the pain medicine prescribed for bad pain. Don't take Tylenol together with the medicine prescribed as the combination could be dangerous to your liver. See your primary care physician if pain is not tolerable or not improving in a week. Return if concern for any reason

## 2017-02-21 NOTE — ED Triage Notes (Signed)
Pt with complaints of L shoulder pain that radiates down his arm.  Pt states onset is over a week and a half ago and is relieved by nothing.  Pt sates he did fall on this shoulder in 2014 and neck surgery in 2014 not associated by fall.  EKG performed in triage, pt denies chest pain or SOB.Jimmy Hall..Jimmy Hall

## 2017-03-09 ENCOUNTER — Encounter: Payer: Self-pay | Admitting: Physician Assistant

## 2017-03-09 ENCOUNTER — Telehealth: Payer: Self-pay | Admitting: Physician Assistant

## 2017-03-09 ENCOUNTER — Ambulatory Visit (INDEPENDENT_AMBULATORY_CARE_PROVIDER_SITE_OTHER): Payer: BC Managed Care – PPO

## 2017-03-09 ENCOUNTER — Ambulatory Visit (INDEPENDENT_AMBULATORY_CARE_PROVIDER_SITE_OTHER): Payer: BC Managed Care – PPO | Admitting: Physician Assistant

## 2017-03-09 VITALS — BP 133/84 | HR 68 | Temp 97.6°F | Ht 69.0 in | Wt 250.6 lb

## 2017-03-09 DIAGNOSIS — M5030 Other cervical disc degeneration, unspecified cervical region: Secondary | ICD-10-CM

## 2017-03-09 DIAGNOSIS — M792 Neuralgia and neuritis, unspecified: Secondary | ICD-10-CM

## 2017-03-09 DIAGNOSIS — R739 Hyperglycemia, unspecified: Secondary | ICD-10-CM | POA: Diagnosis not present

## 2017-03-09 LAB — BAYER DCA HB A1C WAIVED: HB A1C (BAYER DCA - WAIVED): 5.3 % (ref <7.0)

## 2017-03-09 MED ORDER — CYCLOBENZAPRINE HCL 10 MG PO TABS: 10.0000 mg | ORAL_TABLET | Freq: Three times a day (TID) | ORAL | 0 refills | Status: DC | PRN

## 2017-03-09 MED ORDER — HYDROCODONE-ACETAMINOPHEN 5-325 MG PO TABS: 1.0000 | ORAL_TABLET | Freq: Four times a day (QID) | ORAL | 0 refills | Status: DC | PRN

## 2017-03-09 MED ORDER — PREDNISONE 10 MG (48) PO TBPK: ORAL_TABLET | ORAL | 0 refills | Status: DC

## 2017-03-09 NOTE — Patient Instructions (Signed)
In a few days you may receive a survey in the mail or online from [employer] regarding your visit with us today. Please take a moment to fill this out. Your feedback is very important to our whole office. It can help us better understand your needs as well as improve your experience and satisfaction. Thank you for taking your time to complete it. We care about you.  Hayle Parisi, PA-C  

## 2017-03-09 NOTE — Telephone Encounter (Signed)
Pt aware.

## 2017-03-09 NOTE — Progress Notes (Signed)
BP 133/84   Pulse 68   Temp 97.6 F (36.4 C) (Oral)   Ht 5\' 9"  (1.753 m)   Wt 250 lb 9.6 oz (113.7 kg)   BMI 37.01 kg/m    Subjective:    Patient ID: Jimmy Hall, male    DOB: 02-09-1956, 61 y.o.   MRN: [id number]  HPI: Jimmy Hall is a 61 y.o. male presenting on 03/09/2017 for Arm Pain (left, 2 weeks like bursitis went to [facility] on 5/20 pain helped some but then flared up again,no known injury)  This patient with a known history of spinal stenosis and spinal surgery by Dr. Jeral Fruit in 2015, comes in today with cervical pain and left arm pain. He states that his arm will sometimes go numb. The pain is very pulsating at times. He denies any severe weakness. He has tried over-the-counter medications without much relief. No films have been performed yet.   Patient also is having tiredness and is concerned about his sugar being elevated. He has never had an A1c drawn before. There is a family history of diabetes.  Relevant past medical, surgical, family and social history reviewed and updated as indicated. Allergies and medications reviewed and updated.  Past Medical History:  Diagnosis Date  . Chronic back pain    spondylolisthesis  . History of bronchitis    73yrs ago and only one time  . History of gout    not on any meds  . Joint pain    knees  . Numbness    left 4th and 5th finger  . Seasonal allergies    takes OTC meds maybe once a week    Past Surgical History:  Procedure Laterality Date  . ANTERIOR CERVICAL DECOMP/DISCECTOMY FUSION N/A 12/29/2013   Procedure: ANTERIOR CERVICAL DECOMPRESSION/DISCECTOMY FUSION 3 LEVELS;  Surgeon: Karn Cassis, MD;  Location: [facility];  Service: Neurosurgery;  Laterality: N/A;  Anterior Cervical Three-Four/Four-Five/Five-Six  Decompression/Diskectomy/Fusion  . BACK SURGERY  02/2014  . HERNIA REPAIR      Review of Systems  Constitutional: Positive for fatigue. Negative for appetite change.  Eyes: Negative for pain and visual  disturbance.  Respiratory: Negative.  Negative for cough, chest tightness, shortness of breath and wheezing.   Cardiovascular: Negative.  Negative for chest pain, palpitations and leg swelling.  Gastrointestinal: Negative.  Negative for abdominal pain, diarrhea, nausea and vomiting.  Genitourinary: Negative.   Musculoskeletal: Positive for myalgias, neck pain and neck stiffness.  Skin: Negative.  Negative for color change and rash.  Neurological: Positive for weakness. Negative for numbness and headaches.  Psychiatric/Behavioral: Negative.     Allergies as of 03/09/2017   No Known Allergies     Medication List       Accurate as of 03/09/17 12:42 PM. Always use your most recent med list.          cyclobenzaprine 10 MG tablet Commonly known as:  FLEXERIL Take 1 tablet (10 mg total) by mouth 3 (three) times daily as needed for muscle spasms.   HYDROcodone-acetaminophen 5-325 MG tablet Commonly known as:  NORCO Take 1-2 tablets by mouth every 6 (six) hours as needed for severe pain.   predniSONE 10 MG (48) Tbpk tablet Commonly known as:  STERAPRED UNI-PAK 48 TAB Take 12 day pack as directed          Objective:    BP 133/84   Pulse 68   Temp 97.6 F (36.4 C) (Oral)   Ht 5\' 9"  (1.753 m)  Wt 250 lb 9.6 oz (113.7 kg)   BMI 37.01 kg/m   No Known Allergies  Physical Exam  Constitutional: He appears well-developed and well-nourished.  HENT:  Head: Normocephalic and atraumatic.  Eyes: Conjunctivae are normal. Pupils are equal, round, and reactive to light.  Cardiovascular: Normal rate and regular rhythm.   Pulmonary/Chest: Effort normal and breath sounds normal.  Musculoskeletal:       Cervical back: He exhibits decreased range of motion, tenderness, pain and spasm. He exhibits no edema and no deformity.  Nursing note and vitals reviewed.   Results for orders placed or performed in visit on 03/09/17  Bayer DCA Hb A1c Waived  Result Value Ref Range   Bayer DCA Hb A1c  Waived 5.3 <7.0 %      Assessment & Plan:   1. DDD (degenerative disc disease), cervical - DG Cervical Spine Complete; Future - predniSONE (STERAPRED UNI-PAK 48 TAB) 10 MG (48) TBPK tablet; Take 12 day pack as directed  Dispense: 48 tablet; Refill: 0 - cyclobenzaprine (FLEXERIL) 10 MG tablet; Take 1 tablet (10 mg total) by mouth 3 (three) times daily as needed for muscle spasms.  Dispense: 60 tablet; Refill: 0 - HYDROcodone-acetaminophen (NORCO) 5-325 MG tablet; Take 1-2 tablets by mouth every 6 (six) hours as needed for severe pain.  Dispense: 60 tablet; Refill: 0  2. Radicular pain in left arm - DG Cervical Spine Complete; Future - predniSONE (STERAPRED UNI-PAK 48 TAB) 10 MG (48) TBPK tablet; Take 12 day pack as directed  Dispense: 48 tablet; Refill: 0 - cyclobenzaprine (FLEXERIL) 10 MG tablet; Take 1 tablet (10 mg total) by mouth 3 (three) times daily as needed for muscle spasms.  Dispense: 60 tablet; Refill: 0  3. Hyperglycemia - Bayer DCA Hb A1c Waived   Current Outpatient Prescriptions:  .  HYDROcodone-acetaminophen (NORCO) 5-325 MG tablet, Take 1-2 tablets by mouth every 6 (six) hours as needed for severe pain., Disp: 60 tablet, Rfl: 0 .  cyclobenzaprine (FLEXERIL) 10 MG tablet, Take 1 tablet (10 mg total) by mouth 3 (three) times daily as needed for muscle spasms., Disp: 60 tablet, Rfl: 0 .  predniSONE (STERAPRED UNI-PAK 48 TAB) 10 MG (48) TBPK tablet, Take 12 day pack as directed, Disp: 48 tablet, Rfl: 0  Continue all other maintenance medications as listed above.  Follow up plan: Return if symptoms worsen or fail to improve.  Educational handout given for cervical pain  Remus LofflerAngel S. Nur Krasinski PA-C [facility] [address]  [place], [place] [address] [phone]   03/09/2017, 12:42 PM

## 2019-03-15 ENCOUNTER — Ambulatory Visit (INDEPENDENT_AMBULATORY_CARE_PROVIDER_SITE_OTHER): Payer: BC Managed Care – PPO | Admitting: Family Medicine

## 2019-03-15 ENCOUNTER — Other Ambulatory Visit: Payer: Self-pay

## 2019-03-15 ENCOUNTER — Emergency Department (HOSPITAL_COMMUNITY)
Admission: EM | Admit: 2019-03-15 | Discharge: 2019-03-15 | Disposition: A | Discharge status: Home / Self Care | Payer: BC Managed Care – PPO | Attending: Emergency Medicine | Admitting: Emergency Medicine

## 2019-03-15 ENCOUNTER — Encounter (HOSPITAL_COMMUNITY): Payer: Self-pay

## 2019-03-15 ENCOUNTER — Encounter: Payer: Self-pay | Admitting: Family Medicine

## 2019-03-15 DIAGNOSIS — R42 Dizziness and giddiness: Secondary | ICD-10-CM

## 2019-03-15 DIAGNOSIS — R030 Elevated blood-pressure reading, without diagnosis of hypertension: Secondary | ICD-10-CM | POA: Diagnosis not present

## 2019-03-15 DIAGNOSIS — R519 Headache, unspecified: Secondary | ICD-10-CM

## 2019-03-15 DIAGNOSIS — R51 Headache: Secondary | ICD-10-CM | POA: Diagnosis not present

## 2019-03-15 DIAGNOSIS — Z87891 Personal history of nicotine dependence: Secondary | ICD-10-CM | POA: Insufficient documentation

## 2019-03-15 DIAGNOSIS — M436 Torticollis: Secondary | ICD-10-CM | POA: Diagnosis not present

## 2019-03-15 DIAGNOSIS — Z79899 Other long term (current) drug therapy: Secondary | ICD-10-CM | POA: Insufficient documentation

## 2019-03-15 LAB — CBC
HCT: 46.8 % (ref 39.0–52.0)
Hemoglobin: 14.5 g/dL (ref 13.0–17.0)
MCH: 27.7 pg (ref 26.0–34.0)
MCHC: 31 g/dL (ref 30.0–36.0)
MCV: 89.5 fL (ref 80.0–100.0)
Platelets: 250 10*3/uL (ref 150–400)
RBC: 5.23 MIL/uL (ref 4.22–5.81)
RDW: 13.9 % (ref 11.5–15.5)
WBC: 8 10*3/uL (ref 4.0–10.5)
nRBC: 0 % (ref 0.0–0.2)

## 2019-03-15 LAB — BASIC METABOLIC PANEL
Anion gap: 11 (ref 5–15)
BUN: 17 mg/dL (ref 8–23)
CO2: 24 mmol/L (ref 22–32)
Calcium: 9.4 mg/dL (ref 8.9–10.3)
Chloride: 105 mmol/L (ref 98–111)
Creatinine, Ser: 1.25 mg/dL — ABNORMAL HIGH (ref 0.61–1.24)
GFR calc Af Amer: >60 mL/min (ref >60)
GFR calc non Af Amer: >60 mL/min (ref >60)
Glucose, Bld: 93 mg/dL (ref 70–99)
Potassium: 4.4 mmol/L (ref 3.5–5.1)
Sodium: 140 mmol/L (ref 135–145)

## 2019-03-15 LAB — CBG MONITORING, ED: Glucose-Capillary: 86 mg/dL (ref 70–99)

## 2019-03-15 MED ORDER — LISINOPRIL 10 MG PO TABS: 10.0000 mg | ORAL_TABLET | Freq: Every day | ORAL | 0 refills | Status: DC

## 2019-03-15 MED ORDER — SODIUM CHLORIDE 0.9% FLUSH: 3.0000 mL | Freq: Once | INTRAVENOUS | Status: DC

## 2019-03-15 MED ORDER — ACETAMINOPHEN 500 MG PO TABS
1000.0000 mg | ORAL_TABLET | Freq: Once | ORAL | Status: AC
  Administered 2019-03-15: 1000 mg via ORAL

## 2019-03-15 NOTE — Progress Notes (Signed)
Virtual Visit via telephone Note Due to COVID-19, visit is conducted virtually and was requested by patient. This visit type was conducted due to national recommendations for restrictions regarding the COVID-19 Pandemic (e.g. social distancing) in an effort to limit this patient's exposure and mitigate transmission in our community. All issues noted in this document were discussed and addressed.  A physical exam was not performed with this format.   I connected with Jimmy Hall on 03/15/19 at 1305 by telephone and verified that I am speaking with the correct person using two identifiers. Jimmy Hall is currently located at home and no one is currently with them during visit. The provider, Monia Pouch, FNP is located in their office at time of visit.  I discussed the limitations, risks, security and privacy concerns of performing an evaluation and management service by telephone and the availability of in person appointments. I also discussed with the patient that there may be a patient responsible charge related to this service. The patient expressed understanding and agreed to proceed.  Subjective:  Patient ID: Jimmy Hall, male    DOB: 06-Jul-1956, 63 y.o.   MRN: [id number]  Chief Complaint:  Dizziness; Hypertension; and Headache   HPI: Jimmy Hall is a 63 y.o. male presenting on 03/15/2019 for Dizziness; Hypertension; and Headache   Pt reports intermittent and worsening dizziness. States this started 2 days ago. States the dizziness comes and goes. Nothing seems to aggravate or alleviate the dizziness. States he was walking yesterday and developed a headache around his eyes. States he checked his blood pressure and it was 175/100. States he has a stiffness in his neck also. Pt denies vomiting, weakness, focal deficits, slurred speech, or facial droop. He has not tried anything for the symptoms.   Dizziness  This is a new problem. The current episode started in the past 7 days. The  problem occurs intermittently. The problem has been gradually worsening. Associated symptoms include headaches and neck pain. Pertinent negatives include no abdominal pain, anorexia, arthralgias, change in bowel habit, chest pain, chills, congestion, coughing, diaphoresis, fatigue, fever, joint swelling, myalgias, nausea, numbness, rash, sore throat, swollen glands, urinary symptoms, vertigo, visual change, vomiting or weakness. Nothing aggravates the symptoms. He has tried nothing for the symptoms.  Hypertension  This is a new problem. The current episode started in the past 7 days. Associated symptoms include headaches and neck pain. Pertinent negatives include no anxiety, blurred vision, chest pain, malaise/fatigue, orthopnea, palpitations, peripheral edema, PND, shortness of breath or sweats.  Headache   This is a new problem. The current episode started yesterday. The problem occurs intermittently. The problem has been waxing and waning. The pain is located in the frontal and temporal region. The quality of the pain is described as throbbing, aching and sharp. The pain is at a severity of 5/10. Associated symptoms include dizziness, neck pain, phonophobia and photophobia. Pertinent negatives include no abdominal pain, abnormal behavior, anorexia, back pain, blurred vision, coughing, drainage, ear pain, eye pain, eye redness, eye watering, facial sweating, fever, hearing loss, insomnia, loss of balance, muscle aches, nausea, numbness, rhinorrhea, scalp tenderness, seizures, sinus pressure, sore throat, swollen glands, tingling, tinnitus, visual change, vomiting, weakness or weight loss. His past medical history is significant for hypertension.     Relevant past medical, surgical, family, and social history reviewed and updated as indicated.  Allergies and medications reviewed and updated.   Past Medical History:  Diagnosis Date  . Chronic back pain  spondylolisthesis  . History of bronchitis     5824yrs ago and only one time  . History of gout    not on any meds  . Joint pain    knees  . Numbness    left 4th and 5th finger  . Seasonal allergies    takes OTC meds maybe once a week    Past Surgical History:  Procedure Laterality Date  . ANTERIOR CERVICAL DECOMP/DISCECTOMY FUSION N/A 12/29/2013   Procedure: ANTERIOR CERVICAL DECOMPRESSION/DISCECTOMY FUSION 3 LEVELS;  Surgeon: Karn CassisErnesto M Botero, MD;  Location: [facility];  Service: Neurosurgery;  Laterality: N/A;  Anterior Cervical Three-Four/Four-Five/Five-Six  Decompression/Diskectomy/Fusion  . BACK SURGERY  02/2014  . HERNIA REPAIR      Social History   Socioeconomic History  . Marital status: Married    Spouse name: Not on file  . Number of children: Not on file  . Years of education: Not on file  . Highest education level: Not on file  Occupational History  . Not on file  Social Needs  . Financial resource strain: Not on file  . Food insecurity:    Worry: Not on file    Inability: Not on file  . Transportation needs:    Medical: Not on file    Non-medical: Not on file  Tobacco Use  . Smoking status: Former Smoker    Packs/day: 0.50    Years: 6.00    Pack years: 3.00    Types: Cigarettes    Start date: 03/07/1974    Last attempt to quit: 09/24/1991    Years since quitting: 27.4  . Smokeless tobacco: Never Used  Substance and Sexual Activity  . Alcohol use: Yes    Comment: beer occasionally  . Drug use: No  . Sexual activity: Not on file  Lifestyle  . Physical activity:    Days per week: Not on file    Minutes per session: Not on file  . Stress: Not on file  Relationships  . Social connections:    Talks on phone: Not on file    Gets together: Not on file    Attends religious service: Not on file    Active member of club or organization: Not on file    Attends meetings of clubs or organizations: Not on file    Relationship status: Not on file  . Intimate partner violence:    Fear of current or ex  partner: Not on file    Emotionally abused: Not on file    Physically abused: Not on file    Forced sexual activity: Not on file  Other Topics Concern  . Not on file  Social History Narrative  . Not on file    Outpatient Encounter Medications as of 03/15/2019  Medication Sig  . cyclobenzaprine (FLEXERIL) 10 MG tablet Take 1 tablet (10 mg total) by mouth 3 (three) times daily as needed for muscle spasms.  Marland Kitchen. HYDROcodone-acetaminophen (NORCO) 5-325 MG tablet Take 1-2 tablets by mouth every 6 (six) hours as needed for severe pain.  . predniSONE (STERAPRED UNI-PAK 48 TAB) 10 MG (48) TBPK tablet Take 12 day pack as directed   No facility-administered encounter medications on file as of 03/15/2019.     No Known Allergies  Review of Systems  Constitutional: Negative for chills, diaphoresis, fatigue, fever, malaise/fatigue and weight loss.  HENT: Negative for congestion, ear pain, hearing loss, rhinorrhea, sinus pressure, sore throat and tinnitus.   Eyes: Positive for photophobia. Negative for blurred vision, pain,  redness and visual disturbance.  Respiratory: Negative for cough and shortness of breath.   Cardiovascular: Negative for chest pain, palpitations, orthopnea and PND.  Gastrointestinal: Negative for abdominal pain, anorexia, change in bowel habit, nausea and vomiting.  Musculoskeletal: Positive for neck pain and neck stiffness. Negative for arthralgias, back pain, gait problem, joint swelling and myalgias.  Skin: Negative for rash.  Neurological: Positive for dizziness and headaches. Negative for vertigo, tingling, tremors, seizures, syncope, facial asymmetry, speech difficulty, weakness, light-headedness, numbness and loss of balance.  Psychiatric/Behavioral: Negative for confusion. The patient does not have insomnia.   All other systems reviewed and are negative.        Observations/Objective: No vital signs or physical exam, this was a telephone or virtual health encounter.   Pt alert and oriented, answers all questions appropriately, and able to speak in full sentences.    Assessment and Plan: Diagnoses and all orders for this visit:  Dizziness Elevated blood pressure reading Headache in front of head Neck stiffness Reported symptoms concerning for neurovascular event. Pt advised to go to the ED for evaluation and treatment. Pt agreed to this plan. Follow up with PCP after discharge. Call to schedule an appointment.     Follow Up Instructions: Return if symptoms worsen or fail to improve.    I discussed the assessment and treatment plan with the patient. The patient was provided an opportunity to ask questions and all were answered. The patient agreed with the plan and demonstrated an understanding of the instructions.   The patient was advised to call back or seek an in-person evaluation if the symptoms worsen or if the condition fails to improve as anticipated.  The above assessment and management plan was discussed with the patient. The patient verbalized understanding of and has agreed to the management plan. Patient is aware to call the clinic if symptoms persist or worsen. Patient is aware when to return to the clinic for a follow-up visit. Patient educated on when it is appropriate to go to the emergency department.    I provided 15 minutes of non-face-to-face time during this encounter. The call started at 1305. The call ended at 1320. The other time was used for coordination of care.    Kari BaarsMichelle Rakes, FNP-C [facility] [address] [place], [place] [address] [phone]

## 2019-03-15 NOTE — ED Triage Notes (Signed)
Pt presents to ED with complaints of dizziness, and increased BP x 1 week. Pt states he has also had a headache on and off for a week.

## 2019-03-15 NOTE — Discharge Instructions (Addendum)
Follow-up with your family doctor next week °

## 2019-03-15 NOTE — Patient Instructions (Signed)
To ED for evaluation

## 2019-03-15 NOTE — ED Provider Notes (Signed)
[facility]Y [facility] Provider Note   CSN: [id number] Arrival date & time: 03/15/19  1423    History   Chief Complaint Chief Complaint  Patient presents with  . Dizziness    HPI Jimmy Hall is a 63 y.o. male.     Patient states his blood pressures been running high and he is been having some dizziness.  Also some mild neck discomfort  The history is provided by the patient. No language interpreter was used.  Dizziness  Quality:  Lightheadedness Severity:  Mild Onset quality:  Sudden Timing:  Intermittent Progression:  Waxing and waning Chronicity:  New Context: not when bending over   Associated symptoms: no chest pain, no diarrhea and no headaches     Past Medical History:  Diagnosis Date  . Chronic back pain    spondylolisthesis  . History of bronchitis    3042yrs ago and only one time  . History of gout    not on any meds  . Joint pain    knees  . Numbness    left 4th and 5th finger  . Seasonal allergies    takes OTC meds maybe once a week    Patient Active Problem List   Diagnosis Date Noted  . Spondylolisthesis of lumbar region 03/02/2014  . Cervical stenosis of spinal canal 12/29/2013    Past Surgical History:  Procedure Laterality Date  . ANTERIOR CERVICAL DECOMP/DISCECTOMY FUSION N/A 12/29/2013   Procedure: ANTERIOR CERVICAL DECOMPRESSION/DISCECTOMY FUSION 3 LEVELS;  Surgeon: Karn CassisErnesto M Botero, MD;  Location: [facility];  Service: Neurosurgery;  Laterality: N/A;  Anterior Cervical Three-Four/Four-Five/Five-Six  Decompression/Diskectomy/Fusion  . BACK SURGERY  02/2014  . HERNIA REPAIR          Home Medications    Prior to Admission medications   Medication Sig Start Date End Date Taking? Authorizing Provider  cyclobenzaprine (FLEXERIL) 10 MG tablet Take 1 tablet (10 mg total) by mouth 3 (three) times daily as needed for muscle spasms. 03/09/17   Remus LofflerJones, Angel S, PA-C  HYDROcodone-acetaminophen (NORCO) 5-325 MG tablet Take 1-2  tablets by mouth every 6 (six) hours as needed for severe pain. 03/09/17   Remus LofflerJones, Angel S, PA-C  lisinopril (ZESTRIL) 10 MG tablet Take 1 tablet (10 mg total) by mouth daily. 03/15/19   Bethann BerkshireZammit, Ciena Sampley, MD  predniSONE (STERAPRED UNI-PAK 48 TAB) 10 MG (48) TBPK tablet Take 12 day pack as directed 03/09/17   Remus LofflerJones, Angel S, PA-C    Family History Family History  Problem Relation Age of Onset  . Stroke Mother     Social History Social History   Tobacco Use  . Smoking status: Former Smoker    Packs/day: 0.50    Years: 6.00    Pack years: 3.00    Types: Cigarettes    Start date: 03/07/1974    Last attempt to quit: 09/24/1991    Years since quitting: 27.4  . Smokeless tobacco: Never Used  Substance Use Topics  . Alcohol use: Yes    Comment: beer occasionally  . Drug use: No     Allergies   Patient has no known allergies.   Review of Systems Review of Systems  Constitutional: Negative for appetite change and fatigue.  HENT: Negative for congestion, ear discharge and sinus pressure.   Eyes: Negative for discharge.  Respiratory: Negative for cough.   Cardiovascular: Negative for chest pain.  Gastrointestinal: Negative for abdominal pain and diarrhea.  Genitourinary: Negative for frequency and hematuria.  Musculoskeletal: Negative for  back pain.  Skin: Negative for rash.  Neurological: Positive for dizziness. Negative for seizures and headaches.  Psychiatric/Behavioral: Negative for hallucinations.     Physical Exam Updated Vital Signs BP (!) 141/98 (BP Location: Right Arm)   Pulse 95   Temp 98.2 F (36.8 C) (Oral)   Resp 16   Ht 5\' 10"  (1.778 m)   Wt 112 kg   SpO2 99%   BMI 35.44 kg/m   Physical Exam Vitals signs and nursing note reviewed.  Constitutional:      Appearance: He is well-developed.  HENT:     Head: Normocephalic.     Nose: Nose normal.  Eyes:     General: No scleral icterus.    Conjunctiva/sclera: Conjunctivae normal.  Neck:     Musculoskeletal:  Neck supple.     Thyroid: No thyromegaly.  Cardiovascular:     Rate and Rhythm: Normal rate and regular rhythm.     Heart sounds: No murmur. No friction rub. No gallop.   Pulmonary:     Breath sounds: No stridor. No wheezing or rales.  Chest:     Chest wall: No tenderness.  Abdominal:     General: There is no distension.     Tenderness: There is no abdominal tenderness. There is no rebound.  Musculoskeletal: Normal range of motion.  Lymphadenopathy:     Cervical: No cervical adenopathy.  Skin:    Findings: No erythema or rash.  Neurological:     Mental Status: He is oriented to person, place, and time.     Motor: No abnormal muscle tone.     Coordination: Coordination normal.  Psychiatric:        Behavior: Behavior normal.      ED Treatments / Results  Labs (all labs ordered are listed, but only abnormal results are displayed) Labs Reviewed  BASIC METABOLIC PANEL - Abnormal; Notable for the following components:      Result Value   Creatinine, Ser 1.25 (*)    All other components within normal limits  CBC  CBG MONITORING, ED    EKG EKG Interpretation  Date/Time:  Wednesday March 15 2019 14:35:30 EDT Ventricular Rate:  80 PR Interval:  148 QRS Duration: 88 QT Interval:  362 QTC Calculation: 417 R Axis:   -9 Text Interpretation:  Normal sinus rhythm Left ventricular hypertrophy Abnormal ECG Confirmed by Milton Ferguson [phone]) on 03/15/2019 4:12:28 PM   Radiology No results found.  Procedures Procedures (including critical care time)  Medications Ordered in ED Medications  sodium chloride flush (NS) 0.9 % injection 3 mL ( Intravenous Canceled Entry 03/15/19 1533)  acetaminophen (TYLENOL) tablet 1,000 mg (has no administration in time range)     Initial Impression / Assessment and Plan / ED Course  I have reviewed the triage vital signs and the nursing notes.  Pertinent labs & imaging results that were available during my care of the patient were reviewed  by me and considered in my medical decision making (see chart for details).   Labs unremarkable.  Patient with mild hypertension dizziness.  He will be placed on low-dose of lisinopril and will follow-up with his PCP      Final Clinical Impressions(s) / ED Diagnoses   Final diagnoses:  Dizziness    ED Discharge Orders         Ordered    lisinopril (ZESTRIL) 10 MG tablet  Daily     03/15/19 1638           Milton Ferguson,  MD 03/15/19 1649

## 2019-03-15 NOTE — ED Notes (Signed)
Pt was informed that we need a urine sample. Pt states he just urinated.

## 2019-03-17 ENCOUNTER — Telehealth: Payer: Self-pay | Admitting: Family Medicine

## 2019-03-17 NOTE — Telephone Encounter (Signed)
Apt scheduled.  

## 2019-03-17 NOTE — Telephone Encounter (Signed)
Returning call.

## 2019-03-17 NOTE — Telephone Encounter (Signed)
I read the note. I saw him once 4 years ago for an acute illness. He waould need to be seen for me to comment. Please set up an appointment for him. Next week sounds fine. WS

## 2019-03-24 ENCOUNTER — Other Ambulatory Visit: Payer: Self-pay

## 2019-03-27 ENCOUNTER — Ambulatory Visit (INDEPENDENT_AMBULATORY_CARE_PROVIDER_SITE_OTHER): Payer: BC Managed Care – PPO | Admitting: Family Medicine

## 2019-03-27 ENCOUNTER — Other Ambulatory Visit: Payer: Self-pay

## 2019-03-27 ENCOUNTER — Ambulatory Visit (INDEPENDENT_AMBULATORY_CARE_PROVIDER_SITE_OTHER): Payer: BC Managed Care – PPO

## 2019-03-27 ENCOUNTER — Encounter: Payer: Self-pay | Admitting: Family Medicine

## 2019-03-27 VITALS — BP 117/79 | HR 87 | Temp 98.6°F | Ht 70.0 in | Wt 250.0 lb

## 2019-03-27 DIAGNOSIS — M25512 Pain in left shoulder: Secondary | ICD-10-CM

## 2019-03-27 DIAGNOSIS — I10 Essential (primary) hypertension: Secondary | ICD-10-CM

## 2019-03-27 DIAGNOSIS — M542 Cervicalgia: Secondary | ICD-10-CM

## 2019-03-27 MED ORDER — LISINOPRIL 20 MG PO TABS: 20.0000 mg | ORAL_TABLET | Freq: Every day | ORAL | 2 refills | Status: DC

## 2019-03-27 MED ORDER — PREDNISONE 10 MG PO TABS: ORAL_TABLET | ORAL | 0 refills | Status: DC

## 2019-03-27 MED ORDER — TIZANIDINE HCL 6 MG PO CAPS: 6.0000 mg | ORAL_CAPSULE | Freq: Three times a day (TID) | ORAL | 1 refills | Status: DC | PRN

## 2019-03-27 NOTE — Progress Notes (Signed)
Subjective:  Patient ID: Jimmy Hall, male    DOB: 03/08/1956  Age: 63 y.o. MRN: [id number]  CC: Follow-up   HPI [name] presents for  follow-up of hypertension. Patient has no history of headache chest pain or shortness of breath or recent cough.  Patient brings in a list of his blood pressure readings for the last week.  They are running 145/94 153/93 153/101 146/86 156/94, 144/99, 138/90 on log sheet that he brings in.  Patient also denies symptoms of TIA such as focal numbness or weakness. Patient denies side effects from medication. States taking it regularly. Also quite a bit of pain onset 1 month ago, at the left shoulder, goes up to the nape of the neck and down to the elbow. Dull ache. Consant. Moderately severe. No relation to activities. NKI.  Recently went to the emergency department due to the dizziness.  They started him on lisinopril 10 mg and he tells me that the dizziness has resolved spontaneously since that time. History [name] has a past medical history of Chronic back pain, History of bronchitis, History of gout, Joint pain, Numbness, and Seasonal allergies.   He has a past surgical history that includes Hernia repair; Anterior cervical decomp/discectomy fusion (N/A, 12/29/2013); and Back surgery (02/2014).   His family history includes Stroke in his mother.He reports that he quit smoking about 27 years ago. His smoking use included cigarettes. He started smoking about 45 years ago. He has a 3.00 pack-year smoking history. He has never used smokeless tobacco. He reports current alcohol use. He reports that he does not use drugs.  Current Outpatient Medications on File Prior to Visit  Medication Sig Dispense Refill  . cyclobenzaprine (FLEXERIL) 10 MG tablet Take 1 tablet (10 mg total) by mouth 3 (three) times daily as needed for muscle spasms. (Patient not taking: Reported on 03/27/2019) 60 tablet 0   No current facility-administered medications on file prior to visit.      ROS Review of Systems  Constitutional: Negative.   HENT: Negative.   Eyes: Negative for visual disturbance.  Respiratory: Negative for cough and shortness of breath.   Cardiovascular: Negative for chest pain and leg swelling.  Gastrointestinal: Negative for abdominal pain, diarrhea, nausea and vomiting.  Genitourinary: Negative for difficulty urinating.  Musculoskeletal: Negative for arthralgias and myalgias.  Skin: Negative for rash.  Neurological: Positive for dizziness (currently resolved). Negative for headaches.  Psychiatric/Behavioral: Negative for sleep disturbance.    Objective:  BP 117/79   Pulse 87   Temp 98.6 F (37 C) (Oral)   Ht 5\' 10"  (1.778 m)   Wt 250 lb (113.4 kg)   BMI 35.87 kg/m   BP Readings from Last 3 Encounters:  03/27/19 117/79  03/15/19 (!) 133/94  03/09/17 133/84    Wt Readings from Last 3 Encounters:  03/27/19 250 lb (113.4 kg)  03/15/19 247 lb (112 kg)  03/09/17 250 lb 9.6 oz (113.7 kg)     Physical Exam    Assessment & Plan:   [name] was seen today for follow-up.  Diagnoses and all orders for this visit:  Acute pain of left shoulder -     DG Cervical Spine Complete; Future -     DG Shoulder Left; Future  Essential hypertension  Other orders -     tizanidine (ZANAFLEX) 6 MG capsule; Take 1 capsule (6 mg total) by mouth 3 (three) times daily as needed for muscle spasms. -     predniSONE (DELTASONE) 10  MG tablet; Take 5 daily for 3 days followed by 4,3,2 and 1 for 3 days each. -     lisinopril (ZESTRIL) 20 MG tablet; Take 1 tablet (20 mg total) by mouth daily.   Allergies as of 03/27/2019   No Known Allergies     Medication List       Accurate as of March 27, 2019  6:58 PM. If you have any questions, ask your nurse or doctor.        STOP taking these medications   HYDROcodone-acetaminophen 5-325 MG tablet Commonly known as: Norco Stopped by: Mechele ClaudeWarren Ritta Hammes, MD   predniSONE 10 MG (48) Tbpk tablet Commonly known as:  STERAPRED UNI-PAK 48 TAB Replaced by: predniSONE 10 MG tablet Stopped by: Mechele ClaudeWarren Jimmy Stipes, MD     TAKE these medications   cyclobenzaprine 10 MG tablet Commonly known as: FLEXERIL Take 1 tablet (10 mg total) by mouth 3 (three) times daily as needed for muscle spasms.   lisinopril 20 MG tablet Commonly known as: ZESTRIL Take 1 tablet (20 mg total) by mouth daily. What changed:   medication strength  how much to take Changed by: Mechele ClaudeWarren Aldea Avis, MD   predniSONE 10 MG tablet Commonly known as: DELTASONE Take 5 daily for 3 days followed by 4,3,2 and 1 for 3 days each. Replaces: predniSONE 10 MG (48) Tbpk tablet Started by: Mechele ClaudeWarren Deshunda Thackston, MD   tizanidine 6 MG capsule Commonly known as: ZANAFLEX Take 1 capsule (6 mg total) by mouth 3 (three) times daily as needed for muscle spasms. Started by: Mechele ClaudeWarren Chayla Shands, MD       Meds ordered this encounter  Medications  . tizanidine (ZANAFLEX) 6 MG capsule    Sig: Take 1 capsule (6 mg total) by mouth 3 (three) times daily as needed for muscle spasms.    Dispense:  90 capsule    Refill:  1  . predniSONE (DELTASONE) 10 MG tablet    Sig: Take 5 daily for 3 days followed by 4,3,2 and 1 for 3 days each.    Dispense:  45 tablet    Refill:  0  . lisinopril (ZESTRIL) 20 MG tablet    Sig: Take 1 tablet (20 mg total) by mouth daily.    Dispense:  30 tablet    Refill:  2      Follow-up: Return in about 1 month (around 04/26/2019).  Mechele ClaudeWarren Kalyiah Saintil, M.D.

## 2019-04-06 ENCOUNTER — Telehealth: Payer: Self-pay | Admitting: Family Medicine

## 2019-04-06 NOTE — Telephone Encounter (Signed)
Today -139/84 - 70 Has a chart at home he is documenting all BPs. Advised to bring this by next week.  Aware we have on-call provider if he needs anything or has questions about meds over the weekennd.  No HA Feels a little dizzy at times. "woozy" - will monitor and let us know of this gets worse    Could be that his body is getting used to the new BP meds = and he is on prednisone.

## 2019-04-27 ENCOUNTER — Telehealth: Payer: Self-pay | Admitting: Family Medicine

## 2019-04-27 NOTE — Telephone Encounter (Signed)
Pt called - aware of stacks on VAC until 05/08/19.

## 2019-05-10 ENCOUNTER — Telehealth: Payer: Self-pay

## 2019-05-10 NOTE — Telephone Encounter (Signed)
Left detailed message on patients voicemail that Dr Livia Snellen reviewed BPs that patient dropped off and things were looking better. He doesn't have a scheduled appt. Advised to contact the office to schedule a follow up appt.

## 2019-05-13 ENCOUNTER — Other Ambulatory Visit: Payer: Self-pay | Admitting: Family Medicine

## 2019-06-01 ENCOUNTER — Other Ambulatory Visit: Payer: Self-pay

## 2019-06-01 DIAGNOSIS — Z20822 Contact with and (suspected) exposure to covid-19: Secondary | ICD-10-CM

## 2019-06-03 LAB — NOVEL CORONAVIRUS, NAA: SARS-CoV-2, NAA: DETECTED — AB

## 2019-08-15 ENCOUNTER — Other Ambulatory Visit: Payer: Self-pay | Admitting: Family Medicine

## 2019-08-15 ENCOUNTER — Other Ambulatory Visit: Payer: Self-pay

## 2019-08-16 ENCOUNTER — Ambulatory Visit (INDEPENDENT_AMBULATORY_CARE_PROVIDER_SITE_OTHER): Payer: BC Managed Care – PPO

## 2019-08-16 DIAGNOSIS — Z23 Encounter for immunization: Secondary | ICD-10-CM

## 2020-01-04 ENCOUNTER — Ambulatory Visit: Payer: BC Managed Care – PPO | Attending: Internal Medicine

## 2020-01-04 DIAGNOSIS — Z23 Encounter for immunization: Secondary | ICD-10-CM

## 2020-01-04 NOTE — Progress Notes (Signed)
   Covid-19 Vaccination Clinic  Name:  DAGMAWI VENABLE    MRN: [id number] DOB: 10/24/1955  01/04/2020  Mr. Noe was observed post Covid-19 immunization for 15 minutes without incident. He was provided with Vaccine Information Sheet and instruction to access the V-Safe system.   Mr. Sanagustin was instructed to call 911 with any severe reactions post vaccine: Marland Kitchen Difficulty breathing  . Swelling of face and throat  . A fast heartbeat  . A bad rash all over body  . Dizziness and weakness   Immunizations Administered    Name Date Dose VIS Date Route   Moderna COVID-19 Vaccine 01/04/2020 11:02 AM 0.5 mL 09/05/2019 Intramuscular   Manufacturer: Moderna   Lot: [id number]   NDC: 32671-245-80

## 2020-02-01 ENCOUNTER — Ambulatory Visit: Payer: BC Managed Care – PPO | Attending: Internal Medicine

## 2020-02-01 DIAGNOSIS — Z23 Encounter for immunization: Secondary | ICD-10-CM

## 2020-02-01 NOTE — Progress Notes (Signed)
   Covid-19 Vaccination Clinic  Name:  ABBY STINES    MRN: [id number] DOB: 1955/11/28  02/01/2020  Mr. Frier was observed post Covid-19 immunization for 15 minutes without incident. He was provided with Vaccine Information Sheet and instruction to access the V-Safe system.   Mr. Coba was instructed to call 911 with any severe reactions post vaccine: Marland Kitchen Difficulty breathing  . Swelling of face and throat  . A fast heartbeat  . A bad rash all over body  . Dizziness and weakness   Immunizations Administered    Name Date Dose VIS Date Route   Moderna COVID-19 Vaccine 02/01/2020 11:03 AM 0.5 mL 09/2019 Intramuscular   Manufacturer: Moderna   Lot: [id number]   NDC: 84835-075-73

## 2020-03-17 ENCOUNTER — Other Ambulatory Visit: Payer: Self-pay

## 2020-03-17 ENCOUNTER — Emergency Department (HOSPITAL_COMMUNITY): Payer: BC Managed Care – PPO

## 2020-03-17 ENCOUNTER — Encounter (HOSPITAL_COMMUNITY): Payer: Self-pay | Admitting: *Deleted

## 2020-03-17 ENCOUNTER — Emergency Department (HOSPITAL_COMMUNITY)
Admission: EM | Admit: 2020-03-17 | Discharge: 2020-03-17 | Disposition: A | Discharge status: Home / Self Care | Payer: BC Managed Care – PPO | Attending: Emergency Medicine | Admitting: Emergency Medicine

## 2020-03-17 DIAGNOSIS — Z87891 Personal history of nicotine dependence: Secondary | ICD-10-CM | POA: Diagnosis not present

## 2020-03-17 DIAGNOSIS — M545 Low back pain, unspecified: Secondary | ICD-10-CM

## 2020-03-17 DIAGNOSIS — Z79899 Other long term (current) drug therapy: Secondary | ICD-10-CM | POA: Diagnosis not present

## 2020-03-17 DIAGNOSIS — R109 Unspecified abdominal pain: Secondary | ICD-10-CM | POA: Diagnosis present

## 2020-03-17 DIAGNOSIS — I10 Essential (primary) hypertension: Secondary | ICD-10-CM | POA: Insufficient documentation

## 2020-03-17 LAB — CBC WITH DIFFERENTIAL/PLATELET
Abs Immature Granulocytes: 0.03 10*3/uL (ref 0.00–0.07)
Basophils Absolute: 0.1 10*3/uL (ref 0.0–0.1)
Basophils Relative: 1 %
Eosinophils Absolute: 0.2 10*3/uL (ref 0.0–0.5)
Eosinophils Relative: 2 %
HCT: 45.4 % (ref 39.0–52.0)
Hemoglobin: 14.2 g/dL (ref 13.0–17.0)
Immature Granulocytes: 0 %
Lymphocytes Relative: 19 %
Lymphs Abs: 1.8 10*3/uL (ref 0.7–4.0)
MCH: 28.2 pg (ref 26.0–34.0)
MCHC: 31.3 g/dL (ref 30.0–36.0)
MCV: 90.3 fL (ref 80.0–100.0)
Monocytes Absolute: 0.9 10*3/uL (ref 0.1–1.0)
Monocytes Relative: 9 %
Neutro Abs: 6.2 10*3/uL (ref 1.7–7.7)
Neutrophils Relative %: 69 %
Platelets: 246 10*3/uL (ref 150–400)
RBC: 5.03 MIL/uL (ref 4.22–5.81)
RDW: 14.2 % (ref 11.5–15.5)
WBC: 9.1 10*3/uL (ref 4.0–10.5)
nRBC: 0 % (ref 0.0–0.2)

## 2020-03-17 LAB — COMPREHENSIVE METABOLIC PANEL
ALT: 19 U/L (ref 0–44)
AST: 21 U/L (ref 15–41)
Albumin: 4 g/dL (ref 3.5–5.0)
Alkaline Phosphatase: 48 U/L (ref 38–126)
Anion gap: 11 (ref 5–15)
BUN: 18 mg/dL (ref 8–23)
CO2: 26 mmol/L (ref 22–32)
Calcium: 9.2 mg/dL (ref 8.9–10.3)
Chloride: 101 mmol/L (ref 98–111)
Creatinine, Ser: 1.22 mg/dL (ref 0.61–1.24)
GFR calc Af Amer: >60 mL/min (ref >60)
GFR calc non Af Amer: >60 mL/min (ref >60)
Glucose, Bld: 93 mg/dL (ref 70–99)
Potassium: 4.4 mmol/L (ref 3.5–5.1)
Sodium: 138 mmol/L (ref 135–145)
Total Bilirubin: 0.6 mg/dL (ref 0.3–1.2)
Total Protein: 7.9 g/dL (ref 6.5–8.1)

## 2020-03-17 LAB — LIPASE, BLOOD: Lipase: 35 U/L (ref 11–51)

## 2020-03-17 MED ORDER — HYDROCODONE-ACETAMINOPHEN 5-325 MG PO TABS: 1.0000 | ORAL_TABLET | Freq: Four times a day (QID) | ORAL | 0 refills | Status: DC | PRN

## 2020-03-17 MED ORDER — IOHEXOL 300 MG/ML  SOLN
100.0000 mL | Freq: Once | INTRAMUSCULAR | Status: AC | PRN
  Administered 2020-03-17: 100 mL via INTRAVENOUS

## 2020-03-17 MED ORDER — CYCLOBENZAPRINE HCL 10 MG PO TABS: 10.0000 mg | ORAL_TABLET | Freq: Three times a day (TID) | ORAL | 0 refills | Status: DC | PRN

## 2020-03-17 NOTE — Discharge Instructions (Addendum)
Work-up without any acute findings.  We are not able to check urine but that was your decision so keep in mind that that still a possibility there could be a urinary tract infection.  But most likely symptoms are related to musculoskeletal.  Take the Flexeril as needed for muscle pain.  Take hydrocodone as needed for pain control.  Make an appointment to follow-up with your primary care doctor in the next few days.  Return for any new or worse symptoms.

## 2020-03-17 NOTE — ED Triage Notes (Signed)
Patient comes to the ED with right flank pain for 2 weeks.  Patient denies urinary symptoms.

## 2020-03-17 NOTE — ED Provider Notes (Signed)
[facility] EMERGENCY DEPARTMENT Provider Note   CSN: [id number] Arrival date & time: 03/17/20  1428     History Chief Complaint  Patient presents with  . Flank Pain    right    Jimmy Hall is a 64 y.o. male.  Patient with a complaint right flank and back pain for 2 weeks.  Is been getting worse.  No radiation of pain into the legs.  No numbness or weakness to the legs.  No dysuria no hematuria.  No nausea no vomiting no abdominal pain no history of any injury.  Is worse with movement.  Patient many years ago did have lower lumbar spine surgery.        Past Medical History:  Diagnosis Date  . Chronic back pain    spondylolisthesis  . History of bronchitis    6yrs ago and only one time  . History of gout    not on any meds  . Joint pain    knees  . Numbness    left 4th and 5th finger  . Seasonal allergies    takes OTC meds maybe once a week    Patient Active Problem List   Diagnosis Date Noted  . Spondylolisthesis of lumbar region 03/02/2014  . Cervical stenosis of spinal canal 12/29/2013    Past Surgical History:  Procedure Laterality Date  . ANTERIOR CERVICAL DECOMP/DISCECTOMY FUSION N/A 12/29/2013   Procedure: ANTERIOR CERVICAL DECOMPRESSION/DISCECTOMY FUSION 3 LEVELS;  Surgeon: Karn Cassis, MD;  Location: [facility];  Service: Neurosurgery;  Laterality: N/A;  Anterior Cervical Three-Four/Four-Five/Five-Six  Decompression/Diskectomy/Fusion  . BACK SURGERY  02/2014  . HERNIA REPAIR         Family History  Problem Relation Age of Onset  . Stroke Mother     Social History   Tobacco Use  . Smoking status: Former Smoker    Packs/day: 0.50    Years: 6.00    Pack years: 3.00    Types: Cigarettes    Start date: 03/07/1974    Quit date: 09/24/1991    Years since quitting: 28.4  . Smokeless tobacco: Never Used  Vaping Use  . Vaping Use: Never used  Substance Use Topics  . Alcohol use: Yes    Comment: beer occasionally  . Drug use: No     Home Medications Prior to Admission medications   Medication Sig Start Date End Date Taking? Authorizing Provider  cyclobenzaprine (FLEXERIL) 10 MG tablet Take 1 tablet (10 mg total) by mouth 3 (three) times daily as needed for muscle spasms. Patient not taking: Reported on 03/27/2019 03/09/17   Remus Loffler, PA-C  cyclobenzaprine (FLEXERIL) 10 MG tablet Take 1 tablet (10 mg total) by mouth 3 (three) times daily as needed for muscle spasms. 03/17/20   Vanetta Mulders, MD  HYDROcodone-acetaminophen (NORCO/VICODIN) 5-325 MG tablet Take 1 tablet by mouth every 6 (six) hours as needed. 03/17/20   Vanetta Mulders, MD  lisinopril (ZESTRIL) 20 MG tablet Take 1 tablet (20 mg total) by mouth daily. 03/27/19   Mechele Claude, MD  predniSONE (DELTASONE) 10 MG tablet Take 5 daily for 3 days followed by 4,3,2 and 1 for 3 days each. 03/27/19   Mechele Claude, MD  tizanidine (ZANAFLEX) 6 MG capsule TAKE 1 CAPSULE BY MOUTH THREE TIMES A DAY AS NEEDED 08/15/19   Mechele Claude, MD    Allergies    Patient has no known allergies.  Review of Systems   Review of Systems  Constitutional: Negative for chills  and fever.  HENT: Negative for congestion, rhinorrhea and sore throat.   Eyes: Negative for visual disturbance.  Respiratory: Negative for cough and shortness of breath.   Cardiovascular: Negative for chest pain and leg swelling.  Gastrointestinal: Negative for abdominal pain, diarrhea, nausea and vomiting.  Genitourinary: Positive for flank pain. Negative for dysuria.  Musculoskeletal: Positive for back pain. Negative for neck pain.  Skin: Negative for rash.  Neurological: Negative for dizziness, light-headedness and headaches.  Hematological: Does not bruise/bleed easily.  Psychiatric/Behavioral: Negative for confusion.    Physical Exam Updated Vital Signs BP 136/83 (BP Location: Right Arm)   Pulse (!) 58   Temp 98.2 F (36.8 C) (Oral)   Resp 14   Ht 1.803 m (5\' 11" )   Wt 112 kg   SpO2 98%    BMI 34.45 kg/m   Physical Exam Vitals and nursing note reviewed.  Constitutional:      General: He is not in acute distress.    Appearance: Normal appearance. He is well-developed.  HENT:     Head: Normocephalic and atraumatic.  Eyes:     Extraocular Movements: Extraocular movements intact.     Conjunctiva/sclera: Conjunctivae normal.     Pupils: Pupils are equal, round, and reactive to light.  Cardiovascular:     Rate and Rhythm: Normal rate and regular rhythm.     Heart sounds: No murmur heard.   Pulmonary:     Effort: Pulmonary effort is normal. No respiratory distress.     Breath sounds: Normal breath sounds.  Abdominal:     Palpations: Abdomen is soft.     Tenderness: There is no abdominal tenderness.  Musculoskeletal:     Cervical back: Normal range of motion and neck supple.     Comments: Some mild tenderness to palpation kind of right in the CVA area.  Skin:    General: Skin is warm and dry.     Capillary Refill: Capillary refill takes less than 2 seconds.  Neurological:     General: No focal deficit present.     Mental Status: He is alert and oriented to person, place, and time.     Cranial Nerves: No cranial nerve deficit.     Sensory: No sensory deficit.     Motor: No weakness.     ED Results / Procedures / Treatments   Labs (all labs ordered are listed, but only abnormal results are displayed) Labs Reviewed  LIPASE, BLOOD  COMPREHENSIVE METABOLIC PANEL  CBC WITH DIFFERENTIAL/PLATELET  URINALYSIS, ROUTINE W REFLEX MICROSCOPIC    EKG None  Radiology DG Chest 2 View  Result Date: 03/17/2020 CLINICAL DATA:  Back and flank pain for 2 weeks EXAM: CHEST - 2 VIEW COMPARISON:  05/08/2015 chest radiograph. FINDINGS: Partially visualized surgical hardware from ACDF stable cardiomediastinal silhouette with normal heart size. No pneumothorax. No pleural effusion. Mildly hyperinflated lungs. No overt pulmonary edema. No acute consolidative airspace disease.  IMPRESSION: Mildly hyperinflated lungs, cannot exclude COPD. No acute cardiopulmonary disease. Electronically Signed   By: Ilona Sorrel M.D.   On: 03/17/2020 17:11   CT Abdomen Pelvis W Contrast  Result Date: 03/17/2020 CLINICAL DATA:  Abdominal pain for several days EXAM: CT ABDOMEN AND PELVIS WITH CONTRAST TECHNIQUE: Multidetector CT imaging of the abdomen and pelvis was performed using the standard protocol following bolus administration of intravenous contrast. CONTRAST:  166mL OMNIPAQUE 300 COMPARISON:  03/06/2009 FINDINGS: Lower chest: No acute abnormality. Hepatobiliary: Mild fatty infiltration of the liver is noted. The gallbladder is well  distended. Pancreas: Unremarkable. No pancreatic ductal dilatation or surrounding inflammatory changes. Spleen: Normal in size without focal abnormality. Adrenals/Urinary Tract: Adrenal glands are within normal limits. Kidneys demonstrate a normal enhancement pattern bilaterally. No renal calculi or obstructive changes are noted. Normal excretion is noted on delayed images. The bladder is well distended. Stomach/Bowel: The appendix is within normal limits. Scattered diverticular change of the colon is noted without evidence of diverticulitis. No small bowel abnormality is seen. The stomach is decompressed. Vascular/Lymphatic: Aortic atherosclerosis. No enlarged abdominal or pelvic lymph nodes. Reproductive: Prostate is unremarkable. Other: No abdominal wall hernia or abnormality. No abdominopelvic ascites. Musculoskeletal: Degenerative and postsurgical changes of lumbar spine are noted. IMPRESSION: Diverticulosis without diverticulitis. Normal-appearing appendix. No acute abnormality correspond with the patient's given clinical symptomatology is noted. Electronically Signed   By: Alcide Clever M.D.   On: 03/17/2020 17:09    Procedures Procedures (including critical care time)  Medications Ordered in ED Medications  iohexol (OMNIPAQUE) 300 MG/ML solution 100 mL  (100 mLs Intravenous Contrast Given 03/17/20 1641)    ED Course  I have reviewed the triage vital signs and the nursing notes.  Pertinent labs & imaging results that were available during my care of the patient were reviewed by me and considered in my medical decision making (see chart for details).    MDM Rules/Calculators/A&P                          Work-up to include CT scan of the abdomen chest x-ray labs without any acute findings.  Never did get a urine sample.  Patient does not want a wait for that.  States he has no urinary tract symptoms.  However to complete the work-up that would be best.  But patient wants to go home.  We will discharge him home with hydrocodone Flexeril and follow-up with his primary care doctor.  Feel that symptoms are most consistent with musculoskeletal back pain.  No signs of sciatica. Final Clinical Impression(s) / ED Diagnoses Final diagnoses:  Right flank pain  Acute right-sided low back pain without sciatica    Rx / DC Orders ED Discharge Orders         Ordered    HYDROcodone-acetaminophen (NORCO/VICODIN) 5-325 MG tablet  Every 6 hours PRN     Discontinue  Reprint     03/17/20 1924    cyclobenzaprine (FLEXERIL) 10 MG tablet  3 times daily PRN     Discontinue  Reprint     03/17/20 1924           Vanetta Mulders, MD 03/17/20 1929

## 2020-04-20 ENCOUNTER — Emergency Department (HOSPITAL_COMMUNITY)
Admission: EM | Admit: 2020-04-20 | Discharge: 2020-04-20 | Disposition: A | Discharge status: Home / Self Care | Payer: BC Managed Care – PPO | Attending: Emergency Medicine | Admitting: Emergency Medicine

## 2020-04-20 ENCOUNTER — Encounter (HOSPITAL_COMMUNITY): Payer: Self-pay | Admitting: Emergency Medicine

## 2020-04-20 ENCOUNTER — Other Ambulatory Visit: Payer: Self-pay

## 2020-04-20 ENCOUNTER — Emergency Department (HOSPITAL_COMMUNITY): Payer: BC Managed Care – PPO

## 2020-04-20 DIAGNOSIS — R030 Elevated blood-pressure reading, without diagnosis of hypertension: Secondary | ICD-10-CM | POA: Insufficient documentation

## 2020-04-20 DIAGNOSIS — Z20822 Contact with and (suspected) exposure to covid-19: Secondary | ICD-10-CM | POA: Diagnosis not present

## 2020-04-20 DIAGNOSIS — R42 Dizziness and giddiness: Secondary | ICD-10-CM | POA: Insufficient documentation

## 2020-04-20 LAB — SARS CORONAVIRUS 2 BY RT PCR (HOSPITAL ORDER, PERFORMED IN ~~LOC~~ HOSPITAL LAB): SARS Coronavirus 2: NEGATIVE

## 2020-04-20 LAB — CBC WITH DIFFERENTIAL/PLATELET
Abs Immature Granulocytes: 0.05 10*3/uL (ref 0.00–0.07)
Basophils Absolute: 0.1 10*3/uL (ref 0.0–0.1)
Basophils Relative: 1 %
Eosinophils Absolute: 0.2 10*3/uL (ref 0.0–0.5)
Eosinophils Relative: 2 %
HCT: 45.3 % (ref 39.0–52.0)
Hemoglobin: 14 g/dL (ref 13.0–17.0)
Immature Granulocytes: 1 %
Lymphocytes Relative: 21 %
Lymphs Abs: 2.1 10*3/uL (ref 0.7–4.0)
MCH: 27.8 pg (ref 26.0–34.0)
MCHC: 30.9 g/dL (ref 30.0–36.0)
MCV: 89.9 fL (ref 80.0–100.0)
Monocytes Absolute: 1.1 10*3/uL — ABNORMAL HIGH (ref 0.1–1.0)
Monocytes Relative: 11 %
Neutro Abs: 6.3 10*3/uL (ref 1.7–7.7)
Neutrophils Relative %: 64 %
Platelets: 254 10*3/uL (ref 150–400)
RBC: 5.04 MIL/uL (ref 4.22–5.81)
RDW: 14.3 % (ref 11.5–15.5)
WBC: 9.7 10*3/uL (ref 4.0–10.5)
nRBC: 0 % (ref 0.0–0.2)

## 2020-04-20 LAB — COMPREHENSIVE METABOLIC PANEL
ALT: 24 U/L (ref 0–44)
AST: 22 U/L (ref 15–41)
Albumin: 4.1 g/dL (ref 3.5–5.0)
Alkaline Phosphatase: 51 U/L (ref 38–126)
Anion gap: 10 (ref 5–15)
BUN: 16 mg/dL (ref 8–23)
CO2: 21 mmol/L — ABNORMAL LOW (ref 22–32)
Calcium: 9.2 mg/dL (ref 8.9–10.3)
Chloride: 106 mmol/L (ref 98–111)
Creatinine, Ser: 1.29 mg/dL — ABNORMAL HIGH (ref 0.61–1.24)
GFR calc Af Amer: >60 mL/min (ref >60)
GFR calc non Af Amer: 58 mL/min — ABNORMAL LOW (ref >60)
Glucose, Bld: 108 mg/dL — ABNORMAL HIGH (ref 70–99)
Potassium: 3.9 mmol/L (ref 3.5–5.1)
Sodium: 137 mmol/L (ref 135–145)
Total Bilirubin: 0.5 mg/dL (ref 0.3–1.2)
Total Protein: 7.6 g/dL (ref 6.5–8.1)

## 2020-04-20 MED ORDER — ACETAMINOPHEN 325 MG PO TABS
650.0000 mg | ORAL_TABLET | Freq: Once | ORAL | Status: AC
  Administered 2020-04-20: 650 mg via ORAL
  Filled 2020-04-20: qty 2

## 2020-04-20 MED ORDER — MECLIZINE HCL 25 MG PO TABS: 25.0000 mg | ORAL_TABLET | Freq: Three times a day (TID) | ORAL | 0 refills | Status: DC | PRN

## 2020-04-20 NOTE — ED Provider Notes (Signed)
°  Face-to-face evaluation   History: He complains of intermittent headache for 3 days associated with blurred vision.  He denies chest pain, shortness of breath, weakness or vertigo sensation.  He has not had any paresthesia.  He denies double vision.  He is able to walk without difficulty.  He complains of neck pain, which is a chronic intermittent problem.  He has had prior neck surgery.  Physical exam: Alert, cooperative, comfortable, denies headache at time of evaluation, 5:58 PM.  No dysarthria, aphasia or nystagmus.  Normal strength, arms and legs bilaterally.  Medical screening examination/treatment/procedure(s) were conducted as a shared visit with non-physician practitioner(s) and myself.  I personally evaluated the patient during the encounter    Mancel Bale, MD 04/22/20 1146

## 2020-04-20 NOTE — ED Notes (Signed)
Pt ambulated to bathroom with standby assist. Denies dizziness 

## 2020-04-20 NOTE — ED Notes (Signed)
To CT

## 2020-04-20 NOTE — ED Provider Notes (Signed)
[facility] EMERGENCY DEPARTMENT Provider Note   CSN: [id number] Arrival date & time: 04/20/20  1642     History Chief Complaint  Patient presents with  . Dizziness    Jimmy Hall is a 64 y.o. male.  Pt concerned that he could have a sinus infection   The history is provided by the patient. No language interpreter was used.  Dizziness Quality:  Lightheadedness and vertigo Severity:  Moderate Onset quality:  Sudden Duration:  3 days Timing:  Constant Progression:  Worsening Chronicity:  New Relieved by:  Nothing Worsened by:  Nothing Ineffective treatments:  None tried Associated symptoms: no vomiting        Past Medical History:  Diagnosis Date  . Chronic back pain    spondylolisthesis  . History of bronchitis    56yrs ago and only one time  . History of gout    not on any meds  . Joint pain    knees  . Numbness    left 4th and 5th finger  . Seasonal allergies    takes OTC meds maybe once a week    Patient Active Problem List   Diagnosis Date Noted  . Spondylolisthesis of lumbar region 03/02/2014  . Cervical stenosis of spinal canal 12/29/2013    Past Surgical History:  Procedure Laterality Date  . ANTERIOR CERVICAL DECOMP/DISCECTOMY FUSION N/A 12/29/2013   Procedure: ANTERIOR CERVICAL DECOMPRESSION/DISCECTOMY FUSION 3 LEVELS;  Surgeon: Karn Cassis, MD;  Location: [facility];  Service: Neurosurgery;  Laterality: N/A;  Anterior Cervical Three-Four/Four-Five/Five-Six  Decompression/Diskectomy/Fusion  . BACK SURGERY  02/2014  . HERNIA REPAIR         Family History  Problem Relation Age of Onset  . Stroke Mother     Social History   Tobacco Use  . Smoking status: Former Smoker    Packs/day: 0.50    Years: 6.00    Pack years: 3.00    Types: Cigarettes    Start date: 03/07/1974    Quit date: 09/24/1991    Years since quitting: 28.5  . Smokeless tobacco: Never Used  Vaping Use  . Vaping Use: Never used  Substance Use Topics  .  Alcohol use: Yes    Comment: beer occasionally  . Drug use: No    Home Medications Prior to Admission medications   Medication Sig Start Date End Date Taking? Authorizing Provider  lisinopril (ZESTRIL) 20 MG tablet Take 1 tablet (20 mg total) by mouth daily. Patient not taking: Reported on 04/20/2020 03/27/19   Mechele Claude, MD  meclizine (ANTIVERT) 25 MG tablet Take 1 tablet (25 mg total) by mouth 3 (three) times daily as needed for dizziness. 04/20/20   Elson Areas, PA-C    Allergies    Patient has no known allergies.  Review of Systems   Review of Systems  Gastrointestinal: Negative for vomiting.  Neurological: Positive for dizziness.  All other systems reviewed and are negative.   Physical Exam Updated Vital Signs BP (!) 149/86   Pulse 60   Temp 98.8 F (37.1 C) (Oral)   Resp 18   Ht 5\' 10"  (1.778 m)   Wt 110.7 kg   SpO2 99%   BMI 35.01 kg/m   Physical Exam Vitals and nursing note reviewed.  Constitutional:      Appearance: He is well-developed.  HENT:     Head: Normocephalic and atraumatic.     Right Ear: Tympanic membrane normal.     Left Ear: Tympanic membrane normal.  Nose: Nose normal.     Mouth/Throat:     Mouth: Mucous membranes are moist.  Eyes:     Conjunctiva/sclera: Conjunctivae normal.  Cardiovascular:     Rate and Rhythm: Normal rate and regular rhythm.     Heart sounds: No murmur heard.   Pulmonary:     Effort: Pulmonary effort is normal. No respiratory distress.     Breath sounds: Normal breath sounds.  Abdominal:     Palpations: Abdomen is soft.     Tenderness: There is no abdominal tenderness.  Musculoskeletal:        General: Normal range of motion.     Cervical back: Neck supple.  Skin:    General: Skin is warm and dry.  Neurological:     General: No focal deficit present.     Mental Status: He is alert.  Psychiatric:        Mood and Affect: Mood normal.     ED Results / Procedures / Treatments   Labs (all labs  ordered are listed, but only abnormal results are displayed) Labs Reviewed  CBC WITH DIFFERENTIAL/PLATELET - Abnormal; Notable for the following components:      Result Value   Monocytes Absolute 1.1 (*)    All other components within normal limits  COMPREHENSIVE METABOLIC PANEL - Abnormal; Notable for the following components:   CO2 21 (*)    Glucose, Bld 108 (*)    Creatinine, Ser 1.29 (*)    GFR calc non Af Amer 58 (*)    All other components within normal limits  SARS CORONAVIRUS 2 BY RT PCR [facility] ORDER, PERFORMED IN [facility] LAB)    EKG EKG Interpretation  Date/Time:  Saturday April 20 2020 16:57:49 EDT Ventricular Rate:  76 PR Interval:    QRS Duration: 102 QT Interval:  444 QTC Calculation: 500 R Axis:   -6 Text Interpretation: Sinus rhythm Posterior infarct, old Borderline T abnormalities, inferior leads since last tracing no significant change Confirmed by Mancel Bale [phone]) on 04/20/2020 5:51:12 PM   Radiology CT Head Wo Contrast  Result Date: 04/20/2020 CLINICAL DATA:  Headache with blurred vision and neck stiffness EXAM: CT HEAD WITHOUT CONTRAST TECHNIQUE: Contiguous axial images were obtained from the base of the skull through the vertex without intravenous contrast. COMPARISON:  None. FINDINGS: Brain: No evidence of acute territorial infarction, hemorrhage, hydrocephalus,extra-axial collection or mass lesion/mass effect. Mild low-attenuation changes in the deep white matter consistent with small vessel ischemia. Ventricles are normal in size and contour. Vascular: No hyperdense vessel or unexpected calcification. Skull: The skull is intact. No fracture or focal lesion identified. Sinuses/Orbits: The visualized paranasal sinuses and mastoid air cells are clear. The orbits and globes intact. Other: None Cervical spine: Alignment: Physiologic Skull base and vertebrae: Visualized skull base is intact. No atlanto-occipital dissociation. The patient is status  post ACDF from C3 through C6. No periprosthetic lucency or fracture is identified. The vertebral body heights are well maintained. No fracture or pathologic osseous lesion seen. Soft tissues and spinal canal: The visualized paraspinal soft tissues are unremarkable. No prevertebral soft tissue swelling is seen. The spinal canal is grossly unremarkable, no large epidural collection or significant canal narrowing. Disc levels: Multilevel cervical spine spondylosis is seen with disc height loss and uncovertebral osteophytes most notable C6-C7 with mild neural foraminal Upper chest: The lung apices are clear. Thoracic inlet is within normal limits. Other: None IMPRESSION: No acute intracranial abnormality. Findings consistent with mild chronic small vessel ischemia No  acute fracture or malalignment of the spine. Status post ACDF from C3 through C6 without complication. Electronically Signed   By: Jonna Clark M.D.   On: 04/20/2020 18:35   CT Cervical Spine Wo Contrast  Result Date: 04/20/2020 CLINICAL DATA:  Headache with blurred vision and neck stiffness EXAM: CT HEAD WITHOUT CONTRAST TECHNIQUE: Contiguous axial images were obtained from the base of the skull through the vertex without intravenous contrast. COMPARISON:  None. FINDINGS: Brain: No evidence of acute territorial infarction, hemorrhage, hydrocephalus,extra-axial collection or mass lesion/mass effect. Mild low-attenuation changes in the deep white matter consistent with small vessel ischemia. Ventricles are normal in size and contour. Vascular: No hyperdense vessel or unexpected calcification. Skull: The skull is intact. No fracture or focal lesion identified. Sinuses/Orbits: The visualized paranasal sinuses and mastoid air cells are clear. The orbits and globes intact. Other: None Cervical spine: Alignment: Physiologic Skull base and vertebrae: Visualized skull base is intact. No atlanto-occipital dissociation. The patient is status post ACDF from C3  through C6. No periprosthetic lucency or fracture is identified. The vertebral body heights are well maintained. No fracture or pathologic osseous lesion seen. Soft tissues and spinal canal: The visualized paraspinal soft tissues are unremarkable. No prevertebral soft tissue swelling is seen. The spinal canal is grossly unremarkable, no large epidural collection or significant canal narrowing. Disc levels: Multilevel cervical spine spondylosis is seen with disc height loss and uncovertebral osteophytes most notable C6-C7 with mild neural foraminal Upper chest: The lung apices are clear. Thoracic inlet is within normal limits. Other: None IMPRESSION: No acute intracranial abnormality. Findings consistent with mild chronic small vessel ischemia No acute fracture or malalignment of the spine. Status post ACDF from C3 through C6 without complication. Electronically Signed   By: Jonna Clark M.D.   On: 04/20/2020 18:35    Procedures Procedures (including critical care time)  Medications Ordered in ED Medications  acetaminophen (TYLENOL) tablet 650 mg (650 mg Oral Given 04/20/20 1741)    ED Course  I have reviewed the triage vital signs and the nursing notes.  Pertinent labs & imaging results that were available during my care of the patient were reviewed by me and considered in my medical decision making (see chart for details).    MDM Rules/Calculators/A&P                          MDM:  No evidence of sinus disease.  Labs reviewed.  Dr. Effie Shy in to see.  I will give pt rx for antivert.  Pt advised to call his Md to be seen next week for recheck  Final Clinical Impression(s) / ED Diagnoses Final diagnoses:  Dizziness  Vertigo    Rx / DC Orders ED Discharge Orders         Ordered    meclizine (ANTIVERT) 25 MG tablet  3 times daily PRN     Discontinue  Reprint     04/20/20 2102        An After Visit Summary was printed and given to the patient.    Osie Cheeks 04/20/20  2224    Mancel Bale, MD 04/22/20 1146

## 2020-04-20 NOTE — ED Triage Notes (Signed)
Patient c/o dizziness with sharp pain in head and nausea. Patient states woke Thursday morning with it. Per patient dizziness is worse in the morning but is progressively getting worse by day. Patient states blurred vision. Denies any facial droop, slurred speech, or weakness on a certain side.

## 2020-05-23 ENCOUNTER — Other Ambulatory Visit: Payer: Self-pay

## 2020-05-23 ENCOUNTER — Other Ambulatory Visit: Payer: BC Managed Care – PPO

## 2020-05-23 DIAGNOSIS — Z20822 Contact with and (suspected) exposure to covid-19: Secondary | ICD-10-CM

## 2020-05-24 LAB — NOVEL CORONAVIRUS, NAA: SARS-CoV-2, NAA: NOT DETECTED

## 2020-05-24 LAB — SARS-COV-2, NAA 2 DAY TAT

## 2021-04-02 ENCOUNTER — Other Ambulatory Visit: Payer: Self-pay

## 2021-04-02 ENCOUNTER — Encounter: Payer: Self-pay | Admitting: Nurse Practitioner

## 2021-04-02 ENCOUNTER — Ambulatory Visit (INDEPENDENT_AMBULATORY_CARE_PROVIDER_SITE_OTHER): Payer: Medicare Other | Admitting: Nurse Practitioner

## 2021-04-02 ENCOUNTER — Ambulatory Visit (INDEPENDENT_AMBULATORY_CARE_PROVIDER_SITE_OTHER): Payer: Medicare Other

## 2021-04-02 VITALS — BP 139/85 | HR 74 | Temp 97.7°F | Ht 70.0 in | Wt 237.0 lb

## 2021-04-02 DIAGNOSIS — M542 Cervicalgia: Secondary | ICD-10-CM | POA: Diagnosis not present

## 2021-04-02 DIAGNOSIS — G4486 Cervicogenic headache: Secondary | ICD-10-CM

## 2021-04-02 DIAGNOSIS — R519 Headache, unspecified: Secondary | ICD-10-CM | POA: Insufficient documentation

## 2021-04-02 MED ORDER — CYCLOBENZAPRINE HCL 5 MG PO TABS: 5.0000 mg | ORAL_TABLET | Freq: Three times a day (TID) | ORAL | 1 refills | Status: DC | PRN

## 2021-04-02 MED ORDER — IBUPROFEN 600 MG PO TABS: 600.0000 mg | ORAL_TABLET | Freq: Three times a day (TID) | ORAL | 0 refills | Status: DC | PRN

## 2021-04-02 NOTE — Patient Instructions (Signed)
Cervicogenic Headache  A cervicogenic headache is a headache caused by a condition that affects the bones and tissues in your neck (cervical spine). In a cervicogenic headache, the pain moves from your neck to your head. Most cervicogenic headaches start in the upper part of the neck with the first three cervical bones (cervical vertebrae). A cervicogenic headache is diagnosed when a cause can be found in the cervicalspine and other causes of headaches can be ruled out. What are the causes? The most common cause of this condition is a traumatic injury to the cervicalspine, such as whiplash. Other causes include: Arthritis. Broken bone (fracture). Infection. Tumor. What are the signs or symptoms? The most common symptoms are neck and head pain. The pain is often located on one side. In some cases, there may be head pain without neck pain. Pain may befelt in the neck, back or side of the head, face, or behind the eyes. Other symptoms include: Limited movement in the neck. Arm or shoulder pain. How is this diagnosed? This condition may be diagnosed based on: Your symptoms. A physical exam. An injection that blocks nerve signals (nerve block). Imaging tests, such as: X-rays. CT scan. MRI. How is this treated? Treatment for this condition may depend on the underlying condition. Treatment may include: Medicines, such as: NSAIDs. Muscle relaxants. Physical therapy. Massage therapy. Complementary therapies, such as: Biofeedback. Meditation. Acupuncture. Nerve block injections. Botulinum toxin injections. Your treatment plan may involve working with a pain management team that includes your primary health care provider, a pain management specialist, aneurologist, and a physical therapist. Follow these instructions at home: Take over-the-counter and prescription medicines only as told by your health care provider. Do exercises at home as told by your physical therapist. Return to your  normal activities as told by your health care provider. Ask your health care provider what activities are safe for you. Avoid activities that trigger your headaches. Maintain good neck support and posture at home and at work. Keep all follow-up visits as told by your health care provider. This is important. Contact a health care provider if you have: Headaches that are getting worse and happening more often. Headaches with any of the following: Fever. Numbness. Weakness. Dizziness. Nausea or vomiting. Get help right away if: You have a very sudden and severe headache. Summary A cervicogenic headache is a headache caused by a condition that affects the bones and tissues in your cervical spine. Your health care provider may diagnose this condition with a physical exam, a nerve block, and imaging tests. Treatment may include medicine to reduce pain and inflammation, physical therapy, and nerve block injections. Complementary therapies, such as acupuncture and meditation, may be added to other treatments. Your treatment plan may involve working with a pain management team that includes your primary health care provider, a pain management specialist, a neurologist, and a physical therapist. This information is not intended to replace advice given to you by your health care provider. Make sure you discuss any questions you have with your healthcare provider. Document Revised: 08/01/2020 Document Reviewed: 08/01/2020 Elsevier Patient Education  2022 [employer]  

## 2021-04-02 NOTE — Assessment & Plan Note (Signed)
uncontroled neck pain radiating to neck, patient has a history of neck surgery with metal implant. Completed x-ray to reassess metal in neck, ibuprofen, ice/heat compress as tolerated,

## 2021-04-02 NOTE — Progress Notes (Signed)
Acute Office Visit  Subjective:    Patient ID: Jimmy Hall, male    DOB: 01/16/1956, 65 y.o.   MRN: [id number]  Chief Complaint  Patient presents with   Headache   Neck Pain    Headache  Associated symptoms include neck pain.  Neck Pain  Associated symptoms include headaches.  Patient is in today for Pain  He reports new onset right neck pain. was not an injury that may have caused the pain. The pain started yesterday and is staying constant. The pain does radiate back head. The pain is described as aching and soreness, is moderate in intensity, occurring constantly. Symptoms are worse in the: morning, mid-day, afternoon, evening  Aggravating factors: none Relieving factors: none.  He has tried application of heat and NSAIDs with mild relief.     Past Medical History:  Diagnosis Date   Chronic back pain    spondylolisthesis   History of bronchitis    29yrs ago and only one time   History of gout    not on any meds   Joint pain    knees   Numbness    left 4th and 5th finger   Seasonal allergies    takes OTC meds maybe once a week    Past Surgical History:  Procedure Laterality Date   ANTERIOR CERVICAL DECOMP/DISCECTOMY FUSION N/A 12/29/2013   Procedure: ANTERIOR CERVICAL DECOMPRESSION/DISCECTOMY FUSION 3 LEVELS;  Surgeon: Karn Cassis, MD;  Location: [facility];  Service: Neurosurgery;  Laterality: N/A;  Anterior Cervical Three-Four/Four-Five/Five-Six  Decompression/Diskectomy/Fusion   BACK SURGERY  02/2014   HERNIA REPAIR      Family History  Problem Relation Age of Onset   Stroke Mother     Social History   Socioeconomic History   Marital status: Married    Spouse name: Not on file   Number of children: Not on file   Years of education: Not on file   Highest education level: Not on file  Occupational History   Not on file  Tobacco Use   Smoking status: Former    Packs/day: 0.50    Years: 6.00    Pack years: 3.00    Types: Cigarettes    Start  date: 03/07/1974    Quit date: 09/24/1991    Years since quitting: 29.5   Smokeless tobacco: Never  Vaping Use   Vaping Use: Never used  Substance and Sexual Activity   Alcohol use: Yes    Comment: beer occasionally   Drug use: No   Sexual activity: Not on file  Other Topics Concern   Not on file  Social History Narrative   Not on file   Social Determinants of Health   Financial Resource Strain: Not on file  Food Insecurity: Not on file  Transportation Needs: Not on file  Physical Activity: Not on file  Stress: Not on file  Social Connections: Not on file  Intimate Partner Violence: Not on file    Outpatient Medications Prior to Visit  Medication Sig Dispense Refill   lisinopril (ZESTRIL) 20 MG tablet Take 1 tablet (20 mg total) by mouth daily. (Patient not taking: Reported on 04/20/2020) 30 tablet 2   meclizine (ANTIVERT) 25 MG tablet Take 1 tablet (25 mg total) by mouth 3 (three) times daily as needed for dizziness. 30 tablet 0   No facility-administered medications prior to visit.    No Known Allergies  Review of Systems  Constitutional: Negative.   HENT: Negative.  Eyes: Negative.   Respiratory: Negative.    Gastrointestinal: Negative.   Musculoskeletal:  Positive for neck pain.  Neurological:  Positive for headaches.  All other systems reviewed and are negative.     Objective:    Physical Exam Vitals and nursing note reviewed.  Constitutional:      Appearance: Normal appearance.  HENT:     Head: Normocephalic.     Nose: Nose normal.  Eyes:     Conjunctiva/sclera: Conjunctivae normal.  Abdominal:     General: Bowel sounds are normal.  Musculoskeletal:        General: Tenderness present.  Skin:    Findings: No rash.  Neurological:     Mental Status: He is alert and oriented to person, place, and time.  Psychiatric:        Behavior: Behavior normal.    BP 139/85   Pulse 74   Temp 97.7 F (36.5 C) (Temporal)   Ht 5\' 10"  (1.778 m)   Wt 237 lb  (107.5 kg)   SpO2 97%   BMI 34.01 kg/m  Wt Readings from Last 3 Encounters:  04/02/21 237 lb (107.5 kg)  04/20/20 244 lb (110.7 kg)  03/17/20 247 lb (112 kg)    Health Maintenance Due  Topic Date Due   HIV Screening  Never done   Hepatitis C Screening  Never done   TETANUS/TDAP  Never done   COLONOSCOPY (Pts 45-28yrs Insurance coverage will need to be confirmed)  Never done   Zoster Vaccines- Shingrix (1 of 2) Never done   COVID-19 Vaccine (3 - Booster for Moderna series) 07/03/2020   PNA vac Low Risk Adult (1 of 2 - PCV13) 03/07/2021    There are no preventive care reminders to display for this patient.   Lab Results  Component Value Date   TSH 1.150 11/14/2013   Lab Results  Component Value Date   WBC 9.7 04/20/2020   HGB 14.0 04/20/2020   HCT 45.3 04/20/2020   MCV 89.9 04/20/2020   PLT 254 04/20/2020   Lab Results  Component Value Date   NA 137 04/20/2020   K 3.9 04/20/2020   CO2 21 (L) 04/20/2020   GLUCOSE 108 (H) 04/20/2020   BUN 16 04/20/2020   CREATININE 1.29 (H) 04/20/2020   BILITOT 0.5 04/20/2020   ALKPHOS 51 04/20/2020   AST 22 04/20/2020   ALT 24 04/20/2020   PROT 7.6 04/20/2020   ALBUMIN 4.1 04/20/2020   CALCIUM 9.2 04/20/2020   ANIONGAP 10 04/20/2020   Lab Results  Component Value Date   CHOL 219 (H) 11/14/2013   Lab Results  Component Value Date   HDL 49 11/14/2013   Lab Results  Component Value Date   LDLCALC 145 (H) 11/14/2013   Lab Results  Component Value Date   TRIG 123 11/14/2013   Lab Results  Component Value Date   CHOLHDL 4.5 11/14/2013   Lab Results  Component Value Date   HGBA1C 5.3 03/09/2017       Assessment & Plan:   Problem List Items Addressed This Visit   None   No orders of the defined types were placed in this encounter.    05/09/2017, NP

## 2021-04-22 ENCOUNTER — Encounter: Payer: Self-pay | Admitting: Family Medicine

## 2021-04-22 ENCOUNTER — Other Ambulatory Visit: Payer: Self-pay

## 2021-04-22 ENCOUNTER — Ambulatory Visit (INDEPENDENT_AMBULATORY_CARE_PROVIDER_SITE_OTHER): Payer: Medicare Other | Admitting: Family Medicine

## 2021-04-22 VITALS — BP 140/84 | HR 69 | Temp 97.7°F | Ht 70.0 in | Wt 241.0 lb

## 2021-04-22 DIAGNOSIS — Z125 Encounter for screening for malignant neoplasm of prostate: Secondary | ICD-10-CM

## 2021-04-22 DIAGNOSIS — E782 Mixed hyperlipidemia: Secondary | ICD-10-CM | POA: Diagnosis not present

## 2021-04-22 DIAGNOSIS — J301 Allergic rhinitis due to pollen: Secondary | ICD-10-CM | POA: Diagnosis not present

## 2021-04-22 DIAGNOSIS — M542 Cervicalgia: Secondary | ICD-10-CM | POA: Diagnosis not present

## 2021-04-22 DIAGNOSIS — N401 Enlarged prostate with lower urinary tract symptoms: Secondary | ICD-10-CM

## 2021-04-22 DIAGNOSIS — R351 Nocturia: Secondary | ICD-10-CM

## 2021-04-22 MED ORDER — BETAMETHASONE SOD PHOS & ACET 6 (3-3) MG/ML IJ SUSP
6.0000 mg | Freq: Once | INTRAMUSCULAR | Status: AC
  Administered 2021-04-22: 6 mg via INTRAMUSCULAR

## 2021-04-22 MED ORDER — TIZANIDINE HCL 6 MG PO CAPS: 6.0000 mg | ORAL_CAPSULE | Freq: Three times a day (TID) | ORAL | 1 refills | Status: DC | PRN

## 2021-04-22 MED ORDER — FEXOFENADINE HCL 180 MG PO TABS: 180.0000 mg | ORAL_TABLET | Freq: Every day | ORAL | 11 refills | Status: DC

## 2021-04-22 NOTE — Progress Notes (Signed)
Pain on the   Subjective:  Patient ID: Jimmy Hall, male    DOB: 1956/04/08  Age: 65 y.o. MRN: [id number]  CC: check up   HPI Jimmy Hall presents for posterior left neck pain. NKI. Throbbing. Shoots into his head. HA resolved 1 week ago. He has a history of cervical stenosis of the spinal canal as well.  He is concerned about overall health including cholesterol for heart and stroke risk.  Patient has allergic rhinitis symptoms including sneezing frequently sniffling, clear rhinorrhea, watery and itchy eyes. There has been no fever no chills no sweats. No earaches. There is some scratchy throat but no sore throat or difficulty swallowing. There is some nasal congestion.    Hx C3-C6 ACDF. CT neck 1 year ago showed C6-7 spurs Depression screen [facility] 2/9 04/22/2021 04/22/2021 04/02/2021  Decreased Interest 0 0 0  Down, Depressed, Hopeless 0 0 0  PHQ - 2 Score 0 0 0  Altered sleeping - - -  Tired, decreased energy - - -  Change in appetite - - -  Feeling bad or failure about yourself  - - -  Trouble concentrating - - -  Moving slowly or fidgety/restless - - -  Suicidal thoughts - - -  PHQ-9 Score - - -    History Lot has a past medical history of Chronic back pain, History of bronchitis, History of gout, Joint pain, Numbness, and Seasonal allergies.   He has a past surgical history that includes Hernia repair; Anterior cervical decomp/discectomy fusion (N/A, 12/29/2013); and Back surgery (02/2014).   His family history includes Stroke in his mother.He reports that he quit smoking about 29 years ago. His smoking use included cigarettes. He started smoking about 47 years ago. He has a 3.00 pack-year smoking history. He has never used smokeless tobacco. He reports current alcohol use. He reports that he does not use drugs.    ROS Review of Systems  Constitutional:  Negative for fever.  Respiratory:  Negative for shortness of breath.   Cardiovascular:  Negative for chest pain.   Musculoskeletal:  Negative for arthralgias.  Skin:  Negative for rash.   Objective:  BP 140/84   Pulse 69   Temp 97.7 F (36.5 C)   Ht '5\' 10"'  (1.778 m)   Wt 241 lb (109.3 kg)   SpO2 100%   BMI 34.58 kg/m   BP Readings from Last 3 Encounters:  04/22/21 140/84  04/02/21 139/85  04/20/20 (!) 149/86    Wt Readings from Last 3 Encounters:  04/22/21 241 lb (109.3 kg)  04/02/21 237 lb (107.5 kg)  04/20/20 244 lb (110.7 kg)     Physical Exam Vitals reviewed.  Constitutional:      General: He is in acute distress.     Appearance: He is well-developed.  HENT:     Head: Normocephalic.  Eyes:     Pupils: Pupils are equal, round, and reactive to light.  Cardiovascular:     Rate and Rhythm: Normal rate and regular rhythm.     Heart sounds: Normal heart sounds. No murmur heard. Pulmonary:     Effort: Pulmonary effort is normal.     Breath sounds: Normal breath sounds.  Abdominal:     Tenderness: There is no abdominal tenderness.  Musculoskeletal:        General: Tenderness (Left superior trapezius border from the origin at the occiput) present.     Cervical back: Normal range of motion.  Skin:    General:  Skin is warm and dry.  Neurological:     Mental Status: He is alert and oriented to person, place, and time.     Deep Tendon Reflexes: Reflexes are normal and symmetric.  Psychiatric:        Behavior: Behavior normal.        Thought Content: Thought content normal.      Assessment & Plan:   Jimmy Hall was seen today for check up.  Diagnoses and all orders for this visit:  Neck pain -     tizanidine (ZANAFLEX) 6 MG capsule; Take 1 capsule (6 mg total) by mouth 3 (three) times daily as needed for muscle spasms. -     CBC with Differential/Platelet -     CMP14+EGFR -     betamethasone acetate-betamethasone sodium phosphate (CELESTONE) injection 6 mg  Seasonal allergic rhinitis due to pollen -     CBC with Differential/Platelet -     CMP14+EGFR -     fexofenadine  (ALLEGRA) 180 MG tablet; Take 1 tablet (180 mg total) by mouth daily. For allergy symptoms  Screening for prostate cancer -     PSA, total and free  Mixed hyperlipidemia -     CBC with Differential/Platelet -     CMP14+EGFR -     Lipid panel  Benign prostatic hyperplasia with nocturia -     PSA, total and free      I have discontinued Jimmy Hall's cyclobenzaprine and ibuprofen. I am also having him start on fexofenadine. Additionally, I am having him maintain his tizanidine. We administered betamethasone acetate-betamethasone sodium phosphate.  Allergies as of 04/22/2021       Reactions   Cyclobenzaprine Other (See Comments)   Drowsiness   Ibuprofen Nausea Only        Medication List        Accurate as of April 22, 2021  7:45 PM. If you have any questions, ask your nurse or doctor.          STOP taking these medications    cyclobenzaprine 5 MG tablet Commonly known as: FLEXERIL Stopped by: Claretta Fraise, MD   ibuprofen 600 MG tablet Commonly known as: ADVIL Stopped by: Claretta Fraise, MD       TAKE these medications    fexofenadine 180 MG tablet Commonly known as: ALLEGRA Take 1 tablet (180 mg total) by mouth daily. For allergy symptoms Started by: Claretta Fraise, MD   tizanidine 6 MG capsule Commonly known as: ZANAFLEX Take 1 capsule (6 mg total) by mouth 3 (three) times daily as needed for muscle spasms. Started by: Claretta Fraise, MD         Follow-up: Return in about 6 months (around 10/23/2021), or if symptoms worsen or fail to improve.  Claretta Fraise, M.D.

## 2021-04-23 LAB — CMP14+EGFR
ALT: 17 IU/L (ref 0–44)
AST: 20 IU/L (ref 0–40)
Albumin/Globulin Ratio: 1.3 (ref 1.2–2.2)
Albumin: 4 g/dL (ref 3.8–4.8)
Alkaline Phosphatase: 63 IU/L (ref 44–121)
BUN/Creatinine Ratio: 16 (ref 10–24)
BUN: 20 mg/dL (ref 8–27)
Bilirubin Total: 0.5 mg/dL (ref 0.0–1.2)
CO2: 24 mmol/L (ref 20–29)
Calcium: 9.5 mg/dL (ref 8.6–10.2)
Chloride: 100 mmol/L (ref 96–106)
Creatinine, Ser: 1.23 mg/dL (ref 0.76–1.27)
Globulin, Total: 3 g/dL (ref 1.5–4.5)
Glucose: 97 mg/dL (ref 65–99)
Potassium: 4.8 mmol/L (ref 3.5–5.2)
Sodium: 137 mmol/L (ref 134–144)
Total Protein: 7 g/dL (ref 6.0–8.5)
eGFR: 65 mL/min/{1.73_m2} (ref >59)

## 2021-04-23 LAB — CBC WITH DIFFERENTIAL/PLATELET
Basophils Absolute: 0.1 10*3/uL (ref 0.0–0.2)
Basos: 1 %
EOS (ABSOLUTE): 0.4 10*3/uL (ref 0.0–0.4)
Eos: 5 %
Hematocrit: 44.5 % (ref 37.5–51.0)
Hemoglobin: 14.2 g/dL (ref 13.0–17.7)
Immature Grans (Abs): 0.1 10*3/uL (ref 0.0–0.1)
Immature Granulocytes: 1 %
Lymphocytes Absolute: 1.9 10*3/uL (ref 0.7–3.1)
Lymphs: 24 %
MCH: 27.7 pg (ref 26.6–33.0)
MCHC: 31.9 g/dL (ref 31.5–35.7)
MCV: 87 fL (ref 79–97)
Monocytes Absolute: 0.8 10*3/uL (ref 0.1–0.9)
Monocytes: 10 %
Neutrophils Absolute: 4.5 10*3/uL (ref 1.4–7.0)
Neutrophils: 59 %
Platelets: 199 10*3/uL (ref 150–450)
RBC: 5.12 x10E6/uL (ref 4.14–5.80)
RDW: 12.6 % (ref 11.6–15.4)
WBC: 7.6 10*3/uL (ref 3.4–10.8)

## 2021-04-23 LAB — LIPID PANEL
Chol/HDL Ratio: 3.7 ratio (ref 0.0–5.0)
Cholesterol, Total: 234 mg/dL — ABNORMAL HIGH (ref 100–199)
HDL: 63 mg/dL (ref >39)
LDL Chol Calc (NIH): 158 mg/dL — ABNORMAL HIGH (ref 0–99)
Triglycerides: 77 mg/dL (ref 0–149)
VLDL Cholesterol Cal: 13 mg/dL (ref 5–40)

## 2021-04-23 LAB — PSA, TOTAL AND FREE
PSA, Free Pct: 15.4 %
PSA, Free: 0.2 ng/mL
Prostate Specific Ag, Serum: 1.3 ng/mL (ref 0.0–4.0)

## 2021-06-16 ENCOUNTER — Other Ambulatory Visit: Payer: Self-pay

## 2021-06-16 ENCOUNTER — Encounter: Payer: Self-pay | Admitting: Family

## 2021-06-16 ENCOUNTER — Ambulatory Visit (INDEPENDENT_AMBULATORY_CARE_PROVIDER_SITE_OTHER): Payer: Medicare HMO | Admitting: Family

## 2021-06-16 VITALS — BP 130/74 | HR 77 | Temp 97.1°F | Ht 70.0 in | Wt 240.0 lb

## 2021-06-16 DIAGNOSIS — M109 Gout, unspecified: Secondary | ICD-10-CM | POA: Diagnosis not present

## 2021-06-16 MED ORDER — COLCHICINE 0.6 MG PO TABS: ORAL_TABLET | ORAL | 1 refills | Status: DC

## 2021-06-16 MED ORDER — PREDNISONE 10 MG (21) PO TBPK: ORAL_TABLET | ORAL | 0 refills | Status: DC

## 2021-06-16 MED ORDER — DICLOFENAC SODIUM 75 MG PO TBEC: 75.0000 mg | DELAYED_RELEASE_TABLET | Freq: Two times a day (BID) | ORAL | 0 refills | Status: DC

## 2021-06-16 NOTE — Progress Notes (Signed)
   Subjective:    Patient ID: Jimmy Hall, male    DOB: Apr 13, 1956, 65 y.o.   MRN: [id number]  Chief Complaint  Patient presents with   Foot Pain    LEFT FOOT STARTED fRIDAY NIGHT    Pt presents to the office today with left foot pain that started Friday. He states he has hx of gout and this feels similar. He reports aching pain of 9 out 10. He states the area is warm, tender, and swollen. He has taken tylenol.  Foot Pain     Review of Systems  All other systems reviewed and are negative.     Objective:   Physical Exam Vitals reviewed.  Constitutional:      General: He is not in acute distress.    Appearance: He is well-developed.  HENT:     Head: Normocephalic.  Eyes:     General:        Right eye: No discharge.        Left eye: No discharge.     Pupils: Pupils are equal, round, and reactive to light.  Neck:     Thyroid: No thyromegaly.  Cardiovascular:     Rate and Rhythm: Normal rate and regular rhythm.     Heart sounds: Normal heart sounds. No murmur heard. Pulmonary:     Effort: Pulmonary effort is normal. No respiratory distress.     Breath sounds: Normal breath sounds. No wheezing.  Abdominal:     General: Bowel sounds are normal. There is no distension.     Palpations: Abdomen is soft.     Tenderness: There is no abdominal tenderness.  Musculoskeletal:        General: Tenderness present. Normal range of motion.     Cervical back: Normal range of motion and neck supple.       Feet:  Skin:    General: Skin is warm and dry.     Findings: No erythema or rash.  Neurological:     Mental Status: He is alert and oriented to person, place, and time.     Cranial Nerves: No cranial nerve deficit.     Deep Tendon Reflexes: Reflexes are normal and symmetric.  Psychiatric:        Behavior: Behavior normal.        Thought Content: Thought content normal.        Judgment: Judgment normal.     BP 130/74   Pulse 77   Temp (!) 97.1 F (36.2 C) (Temporal)   Ht 5'  10" (1.778 m)   Wt 240 lb (108.9 kg)   BMI 34.44 kg/m       Assessment & Plan:  Jimmy Hall comes in today with chief complaint of Foot Pain (LEFT FOOT STARTED fRIDAY NIGHT )   Diagnosis and orders addressed:  1. Acute gout of left ankle, unspecified cause Rest Take diclofenac with food Force fluids Keep follow up with PCP - colchicine 0.6 MG tablet; Take 2 tabs immediately, then 1 tab twice per day for the duration of the flare up to a max of 7 days  Dispense: 21 tablet; Refill: 1 - diclofenac (VOLTAREN) 75 MG EC tablet; Take 1 tablet (75 mg total) by mouth 2 (two) times daily.  Dispense: 30 tablet; Refill: 0 - predniSONE (STERAPRED UNI-PAK 21 TAB) 10 MG (21) TBPK tablet; Use as directed  Dispense: 21 tablet; Refill: 0   Jannifer Rodney, FNP

## 2021-06-16 NOTE — Patient Instructions (Signed)
Gout  Gout is a condition that causes painful swelling of the joints. Gout is a type of inflammation of the joints (arthritis). This condition is caused by having too much uric acid in the body. Uric acid is a chemical that forms when the body breaks down substances called purines.Purines are important for building body proteins. When the body has too much uric acid, sharp crystals can form and build up inside the joints. This causes pain and swelling. Gout attacks can happen quickly and may be very painful (acute gout). Over time, the attacks can affect more joints and become more frequent (chronic gout). Gout can also cause uric acid to build up under the skin and inside thekidneys. What are the causes? This condition is caused by too much uric acid in your blood. This can happen because: Your kidneys do not remove enough uric acid from your blood. This is the most common cause. Your body makes too much uric acid. This can happen with some cancers and cancer treatments. It can also occur if your body is breaking down too many red blood cells (hemolytic anemia). You eat too many foods that are high in purines. These foods include organ meats and some seafood. Alcohol, especially beer, is also high in purines. A gout attack may be triggered by trauma or stress. What increases the risk? You are more likely to develop this condition if you: Have a family history of gout. Are male and middle-aged. Are male and have gone through menopause. Are obese. Frequently drink alcohol, especially beer. Are dehydrated. Lose weight too quickly. Have an organ transplant. Have lead poisoning. Take certain medicines, including aspirin, cyclosporine, diuretics, levodopa, and niacin. Have kidney disease. Have a skin condition called psoriasis. What are the signs or symptoms? An attack of acute gout happens quickly. It usually occurs in just one joint. The most common place is the big toe. Attacks often start  at night. Other joints that may be affected include joints of the feet, ankle, knee, fingers, wrist, or elbow. Symptoms of this condition may include: Severe pain. Warmth. Swelling. Stiffness. Tenderness. The affected joint may be very painful to touch. Shiny, red, or purple skin. Chills and fever. Chronic gout may cause symptoms more frequently. More joints may be involved. You may also have white or yellow lumps (tophi) on your hands or feet or in other areas near your joints. How is this diagnosed? This condition is diagnosed based on your symptoms, medical history, and physical exam. You may have tests, such as: Blood tests to measure uric acid levels. Removal of joint fluid with a thin needle (aspiration) to look for uric acid crystals. X-rays to look for joint damage. How is this treated? Treatment for this condition has two phases: treating an acute attack and preventing future attacks. Acute gout treatment may include medicines to reduce pain and swelling, including: NSAIDs. Steroids. These are strong anti-inflammatory medicines that can be taken by mouth (orally) or injected into a joint. Colchicine. This medicine relieves pain and swelling when it is taken soon after an attack. It can be given by mouth or through an IV. Preventive treatment may include: Daily use of smaller doses of NSAIDs or colchicine. Use of a medicine that reduces uric acid levels in your blood. Changes to your diet. You may need to see a dietitian about what to eat and drink to prevent gout. Follow these instructions at home: During a gout attack  If directed, put ice on the affected area: Put   ice in a plastic bag. Place a towel between your skin and the bag. Leave the ice on for 20 minutes, 2-3 times a day. Raise (elevate) the affected joint above the level of your heart as often as possible. Rest the joint as much as possible. If the affected joint is in your leg, you may be given crutches to  use. Follow instructions from your health care provider about eating or drinking restrictions.  Avoiding future gout attacks Follow a low-purine diet as told by your dietitian or health care provider. Avoid foods and drinks that are high in purines, including liver, kidney, anchovies, asparagus, herring, mushrooms, mussels, and beer. Maintain a healthy weight or lose weight if you are overweight. If you want to lose weight, talk with your health care provider. It is important that you do not lose weight too quickly. Start or maintain an exercise program as told by your health care provider. Eating and drinking Drink enough fluids to keep your urine pale yellow. If you drink alcohol: Limit how much you use to: 0-1 drink a day for women. 0-2 drinks a day for men. Be aware of how much alcohol is in your drink. In the [place] one drink equals one 12 oz bottle of beer (355 mL) one 5 oz glass of wine (148 mL), or one 1 oz glass of hard liquor (44 mL). General instructions Take over-the-counter and prescription medicines only as told by your health care provider. Do not drive or use heavy machinery while taking prescription pain medicine. Return to your normal activities as told by your health care provider. Ask your health care provider what activities are safe for you. Keep all follow-up visits as told by your health care provider. This is important. Contact a health care provider if you have: Another gout attack. Continuing symptoms of a gout attack after 10 days of treatment. Side effects from your medicines. Chills or a fever. Burning pain when you urinate. Pain in your lower back or belly. Get help right away if you: Have severe or uncontrolled pain. Cannot urinate. Summary Gout is painful swelling of the joints caused by inflammation. The most common site of pain is the big toe, but it can affect other joints in the body. Medicines and dietary changes can help to prevent and treat gout  attacks. This information is not intended to replace advice given to you by your health care provider. Make sure you discuss any questions you have with your healthcare provider. Document Revised: 04/13/2018 Document Reviewed: 04/13/2018 Elsevier Patient Education  2022 [employer]  

## 2021-06-17 ENCOUNTER — Ambulatory Visit: Payer: Medicare Other | Admitting: Family Medicine

## 2021-08-04 ENCOUNTER — Ambulatory Visit: Payer: Medicare Other | Admitting: Family Medicine

## 2021-08-08 ENCOUNTER — Other Ambulatory Visit: Payer: Self-pay

## 2021-08-08 ENCOUNTER — Ambulatory Visit (INDEPENDENT_AMBULATORY_CARE_PROVIDER_SITE_OTHER): Payer: Medicare HMO

## 2021-08-08 DIAGNOSIS — Z23 Encounter for immunization: Secondary | ICD-10-CM | POA: Diagnosis not present

## 2021-08-11 DIAGNOSIS — H5200 Hypermetropia, unspecified eye: Secondary | ICD-10-CM | POA: Diagnosis not present

## 2021-08-11 DIAGNOSIS — E7800 Pure hypercholesterolemia, unspecified: Secondary | ICD-10-CM | POA: Diagnosis not present

## 2021-08-11 DIAGNOSIS — Z0100 Encounter for examination of eyes and vision without abnormal findings: Secondary | ICD-10-CM | POA: Diagnosis not present

## 2021-08-11 DIAGNOSIS — H40023 Open angle with borderline findings, high risk, bilateral: Secondary | ICD-10-CM | POA: Diagnosis not present

## 2021-09-10 ENCOUNTER — Telehealth: Payer: Self-pay | Admitting: Family Medicine

## 2021-09-10 ENCOUNTER — Ambulatory Visit (INDEPENDENT_AMBULATORY_CARE_PROVIDER_SITE_OTHER): Payer: Medicare HMO | Admitting: Nurse Practitioner

## 2021-09-10 ENCOUNTER — Encounter: Payer: Self-pay | Admitting: Nurse Practitioner

## 2021-09-10 VITALS — BP 135/75 | HR 77 | Temp 97.2°F | Ht 70.0 in | Wt 242.2 lb

## 2021-09-10 DIAGNOSIS — M542 Cervicalgia: Secondary | ICD-10-CM | POA: Diagnosis not present

## 2021-09-10 DIAGNOSIS — M109 Gout, unspecified: Secondary | ICD-10-CM

## 2021-09-10 DIAGNOSIS — J069 Acute upper respiratory infection, unspecified: Secondary | ICD-10-CM | POA: Diagnosis not present

## 2021-09-10 LAB — VERITOR FLU A/B WAIVED
Influenza A: NEGATIVE
Influenza B: NEGATIVE

## 2021-09-10 MED ORDER — TIZANIDINE HCL 6 MG PO CAPS: 6.0000 mg | ORAL_CAPSULE | Freq: Three times a day (TID) | ORAL | 1 refills | Status: DC | PRN

## 2021-09-10 MED ORDER — DM-GUAIFENESIN ER 30-600 MG PO TB12: 1.0000 | ORAL_TABLET | Freq: Two times a day (BID) | ORAL | 0 refills | Status: DC

## 2021-09-10 MED ORDER — PREDNISONE 10 MG (21) PO TBPK: ORAL_TABLET | ORAL | 0 refills | Status: DC

## 2021-09-10 NOTE — Telephone Encounter (Signed)
NA/NVM Je did not call in Diclofenac d/t allergy to Ibuprofen, pt to take Tylenol

## 2021-09-10 NOTE — Telephone Encounter (Signed)
Attempted to contact patient. Medications were sent in for URI but don't see mention of pain meds. Advised patient to contact office for more info

## 2021-09-10 NOTE — Telephone Encounter (Signed)
Patient states that diclofenac 75mg  was supposed to be sent to [facility]. Please advise

## 2021-09-10 NOTE — Progress Notes (Signed)
Acute Office Visit  Subjective:    Patient ID: Jimmy Hall, male    DOB: [birth year], 65 y.o.   MRN: [id number]  Chief Complaint  Patient presents with   Nausea    Just feeling yucky   Neck Pain    Left sided neck pain, feels like its twisted   URI    Headache  This is a new problem. The current episode started yesterday. The problem occurs intermittently. The problem has been unchanged. The pain is located in the Bilateral region. The pain does not radiate. The pain quality is similar to prior headaches. The quality of the pain is described as aching. The pain is moderate. Associated symptoms include coughing, nausea, neck pain and sinus pressure. Pertinent negatives include no ear pain, fever, hearing loss or swollen glands. He has tried acetaminophen for the symptoms.  Neck Pain  This is a recurrent problem. The current episode started 1 to 4 weeks ago. The problem has been unchanged. The pain is present in the left side. The quality of the pain is described as aching. The pain is at a severity of 6/10. The pain is moderate. The symptoms are aggravated by position. The pain is Same all the time. Associated symptoms include headaches. Pertinent negatives include no fever. He has tried acetaminophen for the symptoms. The treatment provided moderate relief.  URI  This is a new problem. Episode onset: in the past 3 days. The problem has been unchanged. There has been no fever. Associated symptoms include congestion, coughing, headaches, nausea and neck pain. Pertinent negatives include no ear pain or swollen glands. He has tried acetaminophen for the symptoms. The treatment provided mild relief.   Past Medical History:  Diagnosis Date   Chronic back pain    spondylolisthesis   History of bronchitis    57yr ago and only one time   History of gout    not on any meds   Joint pain    knees   Numbness    left 4th and 5th finger   Seasonal allergies    takes OTC meds maybe once a week     Past Surgical History:  Procedure Laterality Date   ANTERIOR CERVICAL DECOMP/DISCECTOMY FUSION N/A 12/29/2013   Procedure: ANTERIOR CERVICAL DECOMPRESSION/DISCECTOMY FUSION 3 LEVELS;  Surgeon: EFloyce Stakes MD;  Location: [facility];  Service: Neurosurgery;  Laterality: N/A;  Anterior Cervical Three-Four/Four-Five/Five-Six  Decompression/Diskectomy/Fusion   BACK SURGERY  02/2014   HERNIA REPAIR      Family History  Problem Relation Age of Onset   Stroke Mother     Social History   Socioeconomic History   Marital status: Married    Spouse name: Not on file   Number of children: Not on file   Years of education: Not on file   Highest education level: Not on file  Occupational History   Not on file  Tobacco Use   Smoking status: Former    Packs/day: 0.50    Years: 6.00    Pack years: 3.00    Types: Cigarettes    Start date: 03/07/1974    Quit date: 09/24/1991    Years since quitting: 29.9   Smokeless tobacco: Never  Vaping Use   Vaping Use: Never used  Substance and Sexual Activity   Alcohol use: Yes    Comment: beer occasionally   Drug use: No   Sexual activity: Not on file  Other Topics Concern   Not on file  Social History  Narrative   Not on file   Social Determinants of Health   Financial Resource Strain: Not on file  Food Insecurity: Not on file  Transportation Needs: Not on file  Physical Activity: Not on file  Stress: Not on file  Social Connections: Not on file  Intimate Partner Violence: Not on file    Outpatient Medications Prior to Visit  Medication Sig Dispense Refill   colchicine 0.6 MG tablet Take 2 tabs immediately, then 1 tab twice per day for the duration of the flare up to a max of 7 days 21 tablet 1   fexofenadine (ALLEGRA) 180 MG tablet Take 1 tablet (180 mg total) by mouth daily. For allergy symptoms 30 tablet 11   diclofenac (VOLTAREN) 75 MG EC tablet Take 1 tablet (75 mg total) by mouth 2 (two) times daily. 30 tablet 0    tizanidine (ZANAFLEX) 6 MG capsule Take 1 capsule (6 mg total) by mouth 3 (three) times daily as needed for muscle spasms. 90 capsule 1   predniSONE (STERAPRED UNI-PAK 21 TAB) 10 MG (21) TBPK tablet Use as directed 21 tablet 0   No facility-administered medications prior to visit.    Allergies  Allergen Reactions   Cyclobenzaprine Other (See Comments)    Drowsiness   Ibuprofen Nausea Only    Review of Systems  Constitutional:  Negative for fever.  HENT:  Positive for congestion and sinus pressure. Negative for ear pain and hearing loss.   Respiratory:  Positive for cough.   Gastrointestinal:  Positive for nausea.  Musculoskeletal:  Positive for neck pain.  Neurological:  Positive for headaches.      Objective:    Physical Exam Vitals and nursing note reviewed.  Constitutional:      Appearance: Normal appearance. He is well-developed.  HENT:     Head: Normocephalic.     Right Ear: Ear canal and external ear normal.     Left Ear: Ear canal and external ear normal.     Mouth/Throat:     Mouth: Mucous membranes are moist.     Pharynx: Oropharynx is clear.  Eyes:     Conjunctiva/sclera: Conjunctivae normal.  Cardiovascular:     Rate and Rhythm: Normal rate and regular rhythm.     Pulses: Normal pulses.     Heart sounds: Normal heart sounds.  Pulmonary:     Effort: Pulmonary effort is normal.     Breath sounds: Normal breath sounds.  Abdominal:     General: Bowel sounds are normal.     Palpations: Abdomen is soft.  Musculoskeletal:     Cervical back: No crepitus. Pain with movement present. Decreased range of motion.  Lymphadenopathy:     Cervical: No cervical adenopathy.  Skin:    General: Skin is warm.     Findings: No rash.  Neurological:     General: No focal deficit present.     Mental Status: He is alert and oriented to person, place, and time.  Psychiatric:        Mood and Affect: Mood normal.        Behavior: Behavior normal.    BP 135/75   Pulse 77    Temp (!) 97.2 F (36.2 C) (Temporal)   Ht '5\' 10"'  (1.778 m)   Wt 242 lb 3.2 oz (109.9 kg)   SpO2 96%   BMI 34.75 kg/m  Wt Readings from Last 3 Encounters:  09/10/21 242 lb 3.2 oz (109.9 kg)  06/16/21 240 lb (108.9 kg)  04/22/21  241 lb (109.3 kg)    Health Maintenance Due  Topic Date Due   COLONOSCOPY (Pts 45-47yr Insurance coverage will need to be confirmed)  Never done   Zoster Vaccines- Shingrix (1 of 2) Never done   COVID-19 Vaccine (3 - Booster for Moderna series) 03/28/2020   Pneumonia Vaccine 65 Years old (1 - PCV) Never done    There are no preventive care reminders to display for this patient.   Lab Results  Component Value Date   TSH 1.150 11/14/2013   Lab Results  Component Value Date   WBC 7.6 04/22/2021   HGB 14.2 04/22/2021   HCT 44.5 04/22/2021   MCV 87 04/22/2021   PLT 199 04/22/2021   Lab Results  Component Value Date   NA 137 04/22/2021   K 4.8 04/22/2021   CO2 24 04/22/2021   GLUCOSE 97 04/22/2021   BUN 20 04/22/2021   CREATININE 1.23 04/22/2021   BILITOT 0.5 04/22/2021   ALKPHOS 63 04/22/2021   AST 20 04/22/2021   ALT 17 04/22/2021   PROT 7.0 04/22/2021   ALBUMIN 4.0 04/22/2021   CALCIUM 9.5 04/22/2021   ANIONGAP 10 04/20/2020   EGFR 65 04/22/2021   Lab Results  Component Value Date   CHOL 234 (H) 04/22/2021   Lab Results  Component Value Date   HDL 63 04/22/2021   Lab Results  Component Value Date   LDLCALC 158 (H) 04/22/2021   Lab Results  Component Value Date   TRIG 77 04/22/2021   Lab Results  Component Value Date   CHOLHDL 3.7 04/22/2021   Lab Results  Component Value Date   HGBA1C 5.3 03/09/2017       Assessment & Plan:   Problem List Items Addressed This Visit       Respiratory   Upper respiratory tract infection - Primary    Take meds as prescribed - Use a cool mist humidifier  -Use saline nose sprays frequently -Force fluids -For fever or aches or pains- take Tylenol or ibuprofen. - covid-19 and  flu swab completed results pending -Guaifenesin for cough and congestion. Follow up with worsening unresolved symptoms      Relevant Medications   dextromethorphan-guaiFENesin (MUCINEX DM) 30-600 MG 12hr tablet   Other Relevant Orders   Veritor Flu A/B Waived   Novel Coronavirus, NAA ([facility])     Other   Neck pain    Ongoing neck pain symptoms not resolving.  Patient reports muscle relaxant and Mobic are therapeutic when he takes them.  He is currently not taking medication because he ran out.  Rx refill sent to pharmacy.  Take medication as prescribed.  Follow-up with unresolved symptoms.      Relevant Medications   tizanidine (ZANAFLEX) 6 MG capsule   predniSONE (STERAPRED UNI-PAK 21 TAB) 10 MG (21) TBPK tablet   Other Visit Diagnoses     Acute gout of left ankle, unspecified cause       Relevant Medications   tizanidine (ZANAFLEX) 6 MG capsule   predniSONE (STERAPRED UNI-PAK 21 TAB) 10 MG (21) TBPK tablet        Meds ordered this encounter  Medications   dextromethorphan-guaiFENesin (MUCINEX DM) 30-600 MG 12hr tablet    Sig: Take 1 tablet by mouth 2 (two) times daily.    Dispense:  30 tablet    Refill:  0    Order Specific Question:   Supervising Provider    Answer:   SClaretta Fraise[[[id number]]  tizanidine (ZANAFLEX) 6  MG capsule    Sig: Take 1 capsule (6 mg total) by mouth 3 (three) times daily as needed for muscle spasms.    Dispense:  90 capsule    Refill:  1    Order Specific Question:   Supervising Provider    Answer:   Jeneen Rinks   predniSONE (STERAPRED UNI-PAK 21 TAB) 10 MG (21) TBPK tablet    Sig: 6 tablets day 1, 5 tablet due to, 4 tablet day 3, 3 tablet daily for, 2 tablet day 5, 1 tablet day 6    Dispense:  1 each    Refill:  0    Order Specific Question:   Supervising Provider    AnswerJeneen Rinks      Ivy Lynn, NP

## 2021-09-10 NOTE — Patient Instructions (Signed)
Cervical Radiculopathy Cervical radiculopathy means that a nerve in the neck (a cervical nerve) is pinched or bruised. This can happen because of an injury to the cervical spine (vertebrae) in the neck, or as a normal part of getting older. This condition can cause pain or loss of feeling (numbness) that runs from your neck all the way down to your arm and fingers. Often, this condition gets better with rest. Treatment may be needed if the condition does not get better. What are the causes? A neck injury. A bulging disk in your spine. Sudden muscle tightening (muscle spasms). Tight muscles in your neck due to overuse. Arthritis. Breakdown in the bones and joints of the spine (spondylosis) due to getting older. Bone spurs that form near the nerves in the neck. What are the signs or symptoms? Pain. The pain may: Run from the neck to the arm and hand. Be very bad or irritating. Get worse when you move your neck. Loss of feeling or tingling in your arm or hand. Weakness in your arm or hand, in very bad cases. How is this treated? In many cases, treatment is not needed for this condition. With rest, the condition often gets better over time. If treatment is needed, options may include: Wearing a soft neck collar (cervical collar) for short periods of time. Doing exercises (physical therapy) to strengthen your neck muscles. Taking medicines. Having shots (injections) in your spine, in very bad cases. Having surgery. This may be needed if other treatments do not help. The type of surgery that is used will depend on the cause of your condition. Follow these instructions at home: If you have a soft neck collar: Wear it as told by your doctor. Take it off only as told by your doctor. Ask your doctor if you can take the collar off for cleaning and bathing. If you are allowed to take the collar off for cleaning or bathing: Follow instructions from your doctor about how to take off the collar  safely. Clean the collar by wiping it with mild soap and water and drying it completely. Take out any removable pads in the collar every 1-2 days. Wash them by hand with soap and water. Let them air-dry completely before you put them back in the collar. Check your skin under the collar for redness or sores. If you see any, tell your doctor. Managing pain   Take over-the-counter and prescription medicines only as told by your doctor. If told, put ice on the painful area. To do this: If you have a soft neck collar, take if off as told by your doctor. Put ice in a plastic bag. Place a towel between your skin and the bag. Leave the ice on for 20 minutes, 2-3 times a day. Take off the ice if your skin turns bright red. This is very important. If you cannot feel pain, heat, or cold, you have a greater risk of damage to the area. If using ice does not help, you can try using heat. Use the heat source that your doctor recommends, such as a moist heat pack or a heating pad. Place a towel between your skin and the heat source. Leave the heat on for 20-30 minutes. Take off the heat if your skin turns bright red. This is very important. If you cannot feel pain, heat, or cold, you have a greater risk of getting burned. You may try a gentle neck and shoulder rub (massage). Activity Rest as needed. Return to your normal  activities when your doctor says that it is safe. Do exercises as told by your doctor or physical therapist. You may have to avoid lifting. Ask your doctor how much you can safely lift. General instructions Use a flat pillow when you sleep. Do not drive while wearing a soft neck collar. If you do not have a soft neck collar, ask your doctor if it is safe to drive while your neck heals. Ask your doctor if you should avoid driving or using machines while you are taking your medicine. Do not smoke or use any products that contain nicotine or tobacco. If you need help quitting, ask your  doctor. Keep all follow-up visits. Contact a doctor if: Your condition does not get better with treatment. Get help right away if: Your pain gets worse and medicine does not help. You lose feeling or feel weak in your hand, arm, face, or leg. You have a high fever. Your neck is stiff. You cannot control when you poop or pee (have incontinence). You have trouble with walking, balance, or talking. Summary Cervical radiculopathy means that a nerve in the neck is pinched or bruised. A nerve can get pinched from a bulging disk, arthritis, an injury to the neck, or other causes. Symptoms include pain, tingling, or loss of feeling that goes from the neck to the arm or hand. Weakness in your arm or hand can happen in very bad cases. Treatment may include resting, wearing a soft neck collar, and doing exercises. You might need to take medicines for pain. In very bad cases, shots or surgery may be needed. This information is not intended to replace advice given to you by your health care provider. Make sure you discuss any questions you have with your health care provider. Document Revised: 03/27/2021 Document Reviewed: 03/27/2021 Elsevier Patient Education  2022 [employer] Upper Respiratory Infection, Adult An upper respiratory infection (URI) affects the nose, throat, and upper airways that lead to the lungs. The most common type of URI is often called the common cold. URIs usually get better on their own, without medical treatment. What are the causes? A URI is caused by a germ (virus). You may catch these germs by: Breathing in droplets from an infected person's cough or sneeze. Touching something that has the germ on it (is contaminated) and then touching your mouth, nose, or eyes. What increases the risk? You are more likely to get a URI if: You are very young or very old. You have close contact with others, such as at work, school, or a health care facility. You smoke. You have  long-term (chronic) heart or lung disease. You have a weakened disease-fighting system (immune system). You have nasal allergies or asthma. You have a lot of stress. You have poor nutrition. What are the signs or symptoms? Runny or stuffy (congested) nose. Cough. Sneezing. Sore throat. Headache. Feeling tired (fatigue). Fever. Not wanting to eat as much as usual. Pain in your forehead, behind your eyes, and over your cheekbones (sinus pain). Muscle aches. Redness or irritation of the eyes. Pressure in the ears or face. How is this treated? URIs usually get better on their own within 7-10 days. Medicines cannot cure URIs, but your doctor may recommend certain medicines to help relieve symptoms, such as: Over-the-counter cold medicines. Medicines to reduce coughing (cough suppressants). Coughing is a type of defense against infection that helps to clear the nose, throat, windpipe, and lungs (respiratory system). Take these medicines only as told by your doctor.  Medicines to lower your fever. Follow these instructions at home: Activity Rest as needed. If you have a fever, stay home from work or school until your fever is gone, or until your doctor says you may return to work or school. You should stay home until you cannot spread the infection anymore (you are not contagious). Your doctor may have you wear a face mask so you have less risk of spreading the infection. Relieving symptoms Rinse your mouth often with salt water. To make salt water, dissolve -1 tsp (3-6 g) of salt in 1 cup (237 mL) of warm water. Use a cool-mist humidifier to add moisture to the air. This can help you breathe more easily. Eating and drinking  Drink enough fluid to keep your pee (urine) pale yellow. Eat soups and other clear broths. General instructions  Take over-the-counter and prescription medicines only as told by your doctor. Do not smoke or use any products that contain nicotine or tobacco. If  you need help quitting, ask your doctor. Avoid being where people are smoking (avoid secondhand smoke). Stay up to date on all your shots (immunizations), and get the flu shot every year. Keep all follow-up visits. How to prevent the spread of infection to others  Wash your hands with soap and water for at least 20 seconds. If you cannot use soap and water, use hand sanitizer. Avoid touching your mouth, face, eyes, or nose. Cough or sneeze into a tissue or your sleeve or elbow. Do not cough or sneeze into your hand or into the air. Contact a doctor if: You are getting worse, not better. You have any of these: A fever or chills. Brown or red mucus in your nose. Yellow or brown fluid (discharge)coming from your nose. Pain in your face, especially when you bend forward. Swollen neck glands. Pain when you swallow. White areas in the back of your throat. Get help right away if: You have shortness of breath that gets worse. You have very bad or constant: Headache. Ear pain. Pain in your forehead, behind your eyes, and over your cheekbones (sinus pain). Chest pain. You have long-lasting (chronic) lung disease along with any of these: Making high-pitched whistling sounds when you breathe, most often when you breathe out (wheezing). Long-lasting cough (more than 14 days). Coughing up blood. A change in your usual mucus. You have a stiff neck. You have changes in your: Vision. Hearing. Thinking. Mood. These symptoms may be an emergency. Get help right away. Call 911. Do not wait to see if the symptoms will go away. Do not drive yourself to the hospital. Summary An upper respiratory infection (URI) is caused by a germ (virus). The most common type of URI is often called the common cold. URIs usually get better within 7-10 days. Take over-the-counter and prescription medicines only as told by your doctor. This information is not intended to replace advice given to you by your health  care provider. Make sure you discuss any questions you have with your health care provider. Document Revised: 04/23/2021 Document Reviewed: 04/23/2021 Elsevier Patient Education  2022 [employer].

## 2021-09-10 NOTE — Assessment & Plan Note (Signed)
Take meds as prescribed - Use a cool mist humidifier  -Use saline nose sprays frequently -Force fluids -For fever or aches or pains- take Tylenol or ibuprofen. - covid-19 and flu swab completed results pending -Guaifenesin for cough and congestion. Follow up with worsening unresolved symptoms

## 2021-09-10 NOTE — Assessment & Plan Note (Addendum)
Ongoing neck pain symptoms not resolving.  Patient reports muscle relaxant and Mobic are therapeutic when he takes them.  He is currently not taking medication because he ran out.  Rx refill sent to pharmacy.  Take medication as prescribed.  Follow-up with unresolved symptoms.

## 2021-09-11 LAB — NOVEL CORONAVIRUS, NAA: SARS-CoV-2, NAA: NOT DETECTED

## 2021-09-16 NOTE — Telephone Encounter (Signed)
CALLED PATIENT, NO ANSWER, LEFT MESSAGE TO RETURN CALL 

## 2021-09-17 NOTE — Telephone Encounter (Signed)
PATIENT AWARE

## 2021-09-24 DIAGNOSIS — H25813 Combined forms of age-related cataract, bilateral: Secondary | ICD-10-CM | POA: Diagnosis not present

## 2021-09-24 DIAGNOSIS — H40023 Open angle with borderline findings, high risk, bilateral: Secondary | ICD-10-CM | POA: Diagnosis not present

## 2021-10-07 ENCOUNTER — Encounter: Payer: Medicare Other | Admitting: Family Medicine

## 2021-10-28 ENCOUNTER — Encounter: Payer: Self-pay | Admitting: Family Medicine

## 2021-10-28 ENCOUNTER — Ambulatory Visit (INDEPENDENT_AMBULATORY_CARE_PROVIDER_SITE_OTHER): Payer: Medicare Other | Admitting: Family Medicine

## 2021-10-28 VITALS — BP 128/74 | HR 86 | Temp 96.9°F | Ht 70.0 in | Wt 238.8 lb

## 2021-10-28 DIAGNOSIS — Z0001 Encounter for general adult medical examination with abnormal findings: Secondary | ICD-10-CM

## 2021-10-28 DIAGNOSIS — Z0000 Encounter for general adult medical examination without abnormal findings: Secondary | ICD-10-CM

## 2021-10-28 DIAGNOSIS — Z23 Encounter for immunization: Secondary | ICD-10-CM | POA: Diagnosis not present

## 2021-10-28 DIAGNOSIS — Z136 Encounter for screening for cardiovascular disorders: Secondary | ICD-10-CM | POA: Diagnosis not present

## 2021-10-28 DIAGNOSIS — J329 Chronic sinusitis, unspecified: Secondary | ICD-10-CM | POA: Diagnosis not present

## 2021-10-28 DIAGNOSIS — R739 Hyperglycemia, unspecified: Secondary | ICD-10-CM | POA: Diagnosis not present

## 2021-10-28 DIAGNOSIS — J40 Bronchitis, not specified as acute or chronic: Secondary | ICD-10-CM

## 2021-10-28 DIAGNOSIS — Z1211 Encounter for screening for malignant neoplasm of colon: Secondary | ICD-10-CM

## 2021-10-28 LAB — URINALYSIS
Bilirubin, UA: NEGATIVE
Glucose, UA: NEGATIVE
Ketones, UA: NEGATIVE
Nitrite, UA: NEGATIVE
Protein,UA: NEGATIVE
Specific Gravity, UA: 1.015 (ref 1.005–1.030)
Urobilinogen, Ur: 0.2 mg/dL (ref 0.2–1.0)
pH, UA: 6.5 (ref 5.0–7.5)

## 2021-10-28 LAB — BAYER DCA HB A1C WAIVED: HB A1C (BAYER DCA - WAIVED): 5.5 % (ref 4.8–5.6)

## 2021-10-28 MED ORDER — AMOXICILLIN-POT CLAVULANATE 875-125 MG PO TABS: 1.0000 | ORAL_TABLET | Freq: Two times a day (BID) | ORAL | 0 refills | Status: DC

## 2021-10-28 NOTE — Addendum Note (Signed)
Addended by: Adella Hare B on: 10/28/2021 11:51 AM   Modules accepted: Orders

## 2021-10-28 NOTE — Progress Notes (Signed)
WELCOME TO MEDICARE (IPPE) VISIT  10/28/2021  Jimmy Hall DOB:[birth year]     YWV:[id number]  I explained that today's visit was for the purpose of health promotion and disease detection, as well as an introduction to Medicare and it's covered benefits.   Jimmy Hall is a 66 y.o. year old male primary care patient of Claretta Fraise.  Pt. Complains of sinus sx. Symptoms include congestion, facial pain, nasal congestion, Yellow & green productive cough, post nasal drip and sinus pressure. There is no fever, chills, or sweats. Onset of symptoms was a few days ago, gradually worsening since that time.     MEDICAL AND SOCIAL HISTORY Past Medical History Past Medical History:  Diagnosis Date   Chronic back pain    spondylolisthesis   History of bronchitis    73yr ago and only one time   History of gout    not on any meds   Joint pain    knees   Numbness    left 4th and 5th finger   Seasonal allergies    takes OTC meds maybe once a week    Surgical History Past Surgical History:  Procedure Laterality Date   ANTERIOR CERVICAL DECOMP/DISCECTOMY FUSION N/A 12/29/2013   Procedure: ANTERIOR CERVICAL DECOMPRESSION/DISCECTOMY FUSION 3 LEVELS;  Surgeon: EFloyce Stakes MD;  Location: [facility];  Service: Neurosurgery;  Laterality: N/A;  Anterior Cervical Three-Four/Four-Five/Five-Six  Decompression/Diskectomy/Fusion   BACK SURGERY  02/2014   HERNIA REPAIR      Family History Family History  Problem Relation Age of Onset   Stroke Mother     Social History Social History   Socioeconomic History   Marital status: Widowed    Spouse name: Not on file   Number of children: 4   Years of education: Not on file   Highest education level: 12th grade  Occupational History   Not on file  Tobacco Use   Smoking status: Former    Packs/day: 0.50    Years: 6.00    Pack years: 3.00    Types: Cigarettes    Start date: 03/07/1974    Quit date: 09/24/1991    Years since quitting: 30.1    Smokeless tobacco: Never  Vaping Use   Vaping Use: Never used  Substance and Sexual Activity   Alcohol use: Not Currently    Comment: beer occasionally   Drug use: No   Sexual activity: Not on file  Other Topics Concern   Not on file  Social History Narrative   Recently widowed, 4 children all live locally , 7 grandchildren, 2 grandchildren live with him   Social Determinants of HRadio broadcast assistantStrain: Low Risk    Difficulty of Paying Living Expenses: Not hard at all  Food Insecurity: Food Insecurity Present   Worried About RCharity fundraiserin the Last Year: Sometimes true   RArboriculturistin the Last Year: Sometimes true  Transportation Needs: Not on file  Physical Activity: Not on file  Stress: Not on file  Social Connections: Moderately Integrated   Frequency of Communication with Friends and Family: More than three times a week   Frequency of Social Gatherings with Friends and Family: More than three times a week   Attends Religious Services: More than 4 times per year   Active Member of [employer] Organizations: Yes   Attends CArchivistMeetings: More than 4 times per year   Marital Status: Widowed    Current  Medications & Allergies Outpatient Encounter Medications as of 10/28/2021  Medication Sig   amoxicillin-clavulanate (AUGMENTIN) 875-125 MG tablet Take 1 tablet by mouth 2 (two) times daily. Take all of this medication   [DISCONTINUED] colchicine 0.6 MG tablet Take 2 tabs immediately, then 1 tab twice per day for the duration of the flare up to a max of 7 days   [DISCONTINUED] dextromethorphan-guaiFENesin (MUCINEX DM) 30-600 MG 12hr tablet Take 1 tablet by mouth 2 (two) times daily.   [DISCONTINUED] fexofenadine (ALLEGRA) 180 MG tablet Take 1 tablet (180 mg total) by mouth daily. For allergy symptoms   [DISCONTINUED] predniSONE (STERAPRED UNI-PAK 21 TAB) 10 MG (21) TBPK tablet 6 tablets day 1, 5 tablet due to, 4 tablet day 3, 3 tablet daily  for, 2 tablet day 5, 1 tablet day 6   [DISCONTINUED] tizanidine (ZANAFLEX) 6 MG capsule Take 1 capsule (6 mg total) by mouth 3 (three) times daily as needed for muscle spasms.   No facility-administered encounter medications on file as of 10/28/2021.   Cyclobenzaprine and Ibuprofen  Diet & Physical Activity  Current Exercise Habits: Home exercise routine, Type of exercise: Other - see comments (stationary bike), Time (Minutes): 20, Frequency (Times/Week): 3, Weekly Exercise (Minutes/Week): 60, Intensity: Mild, Exercise limited by: None identified  Pt consumes 2 meals a day and 1 snacks a day.  The patient feels that he mostly follow a Regular diet.   DEPRESSION SCREENING PHQ 2/9 Scores 10/28/2021 10/28/2021 09/10/2021 06/16/2021 04/22/2021 04/22/2021 04/02/2021  PHQ - 2 Score 0 0 0 0 0 0 0  PHQ- 9 Score - - 3 - - - -     FUNCTIONAL ABILITY & LEVEL OF SAFETY Fall Risk Fall Risk  10/28/2021 09/10/2021 04/22/2021 04/22/2021 04/02/2021  Falls in the past year? 0 0 1 0 0  Number falls in past yr: - 0 0 - -  Injury with Fall? - 0 0 - -  Risk for fall due to : - No Fall Risks History of fall(s) - -  Follow up - Falls evaluation completed Falls evaluation completed - -    Activities of Daily Living In your present state of health, do you have any difficulty performing the following activities: 10/28/2021 09/10/2021  Hearing? N N  Vision? N Y  Difficulty concentrating or making decisions? N Y  Comment - sometimes  Walking or climbing stairs? N Y  Comment - sometimes, joint pains  Dressing or bathing? N N  Doing errands, shopping? N N  Preparing Food and eating ? N -  Using the Toilet? N -  In the past six months, have you accidently leaked urine? N -  Do you have problems with loss of bowel control? N -  Managing your Medications? N -  Managing your Finances? N -  Housekeeping or managing your Housekeeping? N -  Some recent data might be hidden    Cognitive Function     6CIT Screen  10/28/2021  What Year? 0 points  What month? 0 points  What time? 0 points  Count back from 20 0 points  Months in reverse 4 points  Repeat phrase 0 points  Total Score 4   Normal Cognitive Function Screening today: Yes   EXAM (physical exam is not indicated for this visit type) Today's Vitals   10/28/21 0909  BP: 128/74  Pulse: 86  Temp: (!) 96.9 F (36.1 C)  SpO2: 97%  Weight: 238 lb 12.8 oz (108.3 kg)  Height: _0  (1.778 m)  Body mass index is 34.26 kg/m.  Visual Acuity Screen: Pt gets routine eye care through optometrist/ophthalmologist and is up to date with follow up care with this provider.  Physical Exam Constitutional:      Appearance: He is well-developed.  HENT:     Head: Normocephalic and atraumatic.     Right Ear: Tympanic membrane and external ear normal. No decreased hearing noted.     Left Ear: Tympanic membrane and external ear normal. No decreased hearing noted.     Nose: Mucosal edema present.     Right Sinus: No frontal sinus tenderness.     Left Sinus: No frontal sinus tenderness.     Mouth/Throat:     Pharynx: No oropharyngeal exudate or posterior oropharyngeal erythema.  Neck:     Meningeal: Brudzinski's sign absent.  Pulmonary:     Effort: No respiratory distress.     Breath sounds: Normal breath sounds.  Lymphadenopathy:     Head:     Right side of head: No preauricular adenopathy.     Left side of head: No preauricular adenopathy.     Cervical:     Right cervical: No superficial cervical adenopathy.    Left cervical: No superficial cervical adenopathy.    No additional physical exam required or indicated today.  Health Maintenance Health Maintenance  Topic Date Due   COLONOSCOPY (Pts 45-40yr Insurance coverage will need to be confirmed)  Never done   Zoster Vaccines- Shingrix (1 of 2) Never done   COVID-19 Vaccine (3 - Booster for Moderna series) 03/28/2020   Pneumonia Vaccine 66 Years old (1 - PCV) Never done   TETANUS/TDAP   04/22/2022 (Originally 03/08/1975)   Hepatitis C Screening  04/22/2022 (Originally 03/07/1974)   HIV Screening  04/22/2022 (Originally 03/08/1971)   INFLUENZA VACCINE  Completed   HPV VACCINES  Aged Out     END OF LIFE PLANNING Advanced directives and power of attorney information specific to the patient were discussed.    Advanced Directives 10/28/2021 04/20/2020 03/15/2019 02/21/2017 12/13/2014 03/02/2014 02/27/2014  Does Patient Have a Medical Advance Directive? _0  Patient does not have advance directive;Patient would like information Patient does not have advance directive;Patient would like information  Would patient like information on creating a medical advance directive? No - Patient declined - - - - - Advance directive packet given  Pre-existing out of facility DNR order (yellow form or pink MOST form) - - - - - - No     Education, counseling, and referrals based on the information obtained/reviewed today:    Education, counseling, and referral for other preventive services: Written checklist was completed and given to pt for obtaining, as appropriate, the other preventive services that are covered as separate Medicare Part B benefits. Possible services that were reviewed with pt are:  -annual wellness visit (AWV) -Bone mass measurements -Cardiovascular screening blood tests -Colorectal cancer screening -Diabetes screening tests -Glaucoma screening -HIV screening -Medical nutrition therapy -Prostate cancer screening -Seasonal influenza, pneumococcal, and Hep B vaccines -screening mammography -screening pap tests and pelvic exam -ultrasound screening for AAA  Patient was given opportunity to ask any additional questions regarding Medicare and covered benefits.  Patient was informed that Medicare does not provide coverage for routine physical exams.  I answered all questions to the best of my ability today.    1. Welcome to Medicare preventive visit   2.  Sinobronchitis   3. Screen for colon cancer     Meds ordered this encounter  Medications   amoxicillin-clavulanate (AUGMENTIN) 875-125 MG tablet    Sig: Take 1 tablet by mouth 2 (two) times daily. Take all of this medication    Dispense:  20 tablet    Refill:  0    Orders Placed This Encounter  Procedures   US [facility] MEDICARE SCREENING    Standing Status:   Future    Standing Expiration Date:   04/27/2022    Order Specific Question:   Reason for Exam (SYMPTOM  OR DIAGNOSIS REQUIRED)    Answer:   Welcome to Medicare screening    Order Specific Question:   Preferred imaging location?    Answer:   [place] [facility] Hb A1c Waived   CBC with Differential/Platelet   CMP14+EGFR    Order Specific Question:   Has the patient fasted?    Answer:   Yes    Order Specific Question:   Release to patient    Answer:   Immediate   VITAMIN D 25 Hydroxy (Vit-D Deficiency, Fractures)   Urinalysis   Ambulatory referral to Gastroenterology    Referral Priority:   Routine    Referral Type:   Consultation    Referral Reason:   Specialty Services Required    Number of Visits Requested:   1   EKG 12-Lead    Follow up as needed.6 mos  Claretta Fraise, MD

## 2021-10-29 ENCOUNTER — Other Ambulatory Visit: Payer: Self-pay | Admitting: *Deleted

## 2021-10-29 LAB — CMP14+EGFR
ALT: 17 IU/L (ref 0–44)
AST: 16 IU/L (ref 0–40)
Albumin/Globulin Ratio: 1.7 (ref 1.2–2.2)
Albumin: 4.6 g/dL (ref 3.8–4.8)
Alkaline Phosphatase: 64 IU/L (ref 44–121)
BUN/Creatinine Ratio: 10 (ref 10–24)
BUN: 14 mg/dL (ref 8–27)
Bilirubin Total: 0.4 mg/dL (ref 0.0–1.2)
CO2: 24 mmol/L (ref 20–29)
Calcium: 10 mg/dL (ref 8.6–10.2)
Chloride: 104 mmol/L (ref 96–106)
Creatinine, Ser: 1.47 mg/dL — ABNORMAL HIGH (ref 0.76–1.27)
Globulin, Total: 2.7 g/dL (ref 1.5–4.5)
Glucose: 97 mg/dL (ref 70–99)
Potassium: 4.7 mmol/L (ref 3.5–5.2)
Sodium: 142 mmol/L (ref 134–144)
Total Protein: 7.3 g/dL (ref 6.0–8.5)
eGFR: 53 mL/min/1.73 — ABNORMAL LOW

## 2021-10-29 LAB — CBC WITH DIFFERENTIAL/PLATELET
Basophils Absolute: 0.1 x10E3/uL (ref 0.0–0.2)
Basos: 1 %
EOS (ABSOLUTE): 0.2 x10E3/uL (ref 0.0–0.4)
Eos: 3 %
Hematocrit: 48.8 % (ref 37.5–51.0)
Hemoglobin: 15.9 g/dL (ref 13.0–17.7)
Immature Grans (Abs): 0 x10E3/uL (ref 0.0–0.1)
Immature Granulocytes: 1 %
Lymphocytes Absolute: 1.5 x10E3/uL (ref 0.7–3.1)
Lymphs: 18 %
MCH: 28.6 pg (ref 26.6–33.0)
MCHC: 32.6 g/dL (ref 31.5–35.7)
MCV: 88 fL (ref 79–97)
Monocytes Absolute: 0.8 x10E3/uL (ref 0.1–0.9)
Monocytes: 9 %
Neutrophils Absolute: 5.7 x10E3/uL (ref 1.4–7.0)
Neutrophils: 68 %
Platelets: 253 x10E3/uL (ref 150–450)
RBC: 5.56 x10E6/uL (ref 4.14–5.80)
RDW: 12.6 % (ref 11.6–15.4)
WBC: 8.3 x10E3/uL (ref 3.4–10.8)

## 2021-10-29 LAB — VITAMIN D 25 HYDROXY (VIT D DEFICIENCY, FRACTURES): Vit D, 25-Hydroxy: 16.7 ng/mL — ABNORMAL LOW (ref 30.0–100.0)

## 2021-10-29 MED ORDER — VITAMIN D (ERGOCALCIFEROL) 1.25 MG (50000 UNIT) PO CAPS: 50000.0000 [IU] | ORAL_CAPSULE | ORAL | 3 refills | Status: DC

## 2021-10-29 NOTE — Progress Notes (Signed)
Dear [name], Your Vitamin D is  low. You need a prescription strength supplement I will send that in for you. Nurse, if at all possible, could you send in a prescription for the patient for vitamin D 50,000 units, 1 p.o. weekly #13 with 3 refills? Many thanks, WS

## 2021-10-30 ENCOUNTER — Encounter: Payer: Self-pay | Admitting: Internal Medicine

## 2021-10-30 ENCOUNTER — Telehealth: Payer: Self-pay | Admitting: Family Medicine

## 2021-10-30 ENCOUNTER — Other Ambulatory Visit: Payer: Self-pay | Admitting: *Deleted

## 2021-10-30 NOTE — Telephone Encounter (Signed)
Returning call about ultrasound per pt. Please call back.

## 2021-11-07 ENCOUNTER — Ambulatory Visit (HOSPITAL_COMMUNITY)
Admission: RE | Admit: 2021-11-07 | Discharge: 2021-11-07 | Disposition: A | Discharge status: Home / Self Care | Payer: Medicare Other | Source: Ambulatory Visit | Attending: Family Medicine | Admitting: Family Medicine

## 2021-11-07 ENCOUNTER — Other Ambulatory Visit: Payer: Self-pay

## 2021-11-07 DIAGNOSIS — Z136 Encounter for screening for cardiovascular disorders: Secondary | ICD-10-CM | POA: Insufficient documentation

## 2021-11-07 DIAGNOSIS — Z87891 Personal history of nicotine dependence: Secondary | ICD-10-CM | POA: Diagnosis not present

## 2021-11-07 DIAGNOSIS — Z0000 Encounter for general adult medical examination without abnormal findings: Secondary | ICD-10-CM

## 2021-12-02 ENCOUNTER — Ambulatory Visit (INDEPENDENT_AMBULATORY_CARE_PROVIDER_SITE_OTHER): Payer: Medicare Other | Admitting: Nurse Practitioner

## 2021-12-02 ENCOUNTER — Encounter: Payer: Self-pay | Admitting: Nurse Practitioner

## 2021-12-02 VITALS — BP 137/92 | HR 81 | Temp 98.8°F | Ht 70.0 in | Wt 236.0 lb

## 2021-12-02 DIAGNOSIS — R052 Subacute cough: Secondary | ICD-10-CM | POA: Diagnosis not present

## 2021-12-02 DIAGNOSIS — J069 Acute upper respiratory infection, unspecified: Secondary | ICD-10-CM

## 2021-12-02 MED ORDER — CETIRIZINE HCL 10 MG PO TABS
10.0000 mg | ORAL_TABLET | Freq: Every day | ORAL | 0 refills | Status: DC
Start: 2021-12-02 — End: 2021-12-24

## 2021-12-02 MED ORDER — BENZONATATE 100 MG PO CAPS: 100.0000 mg | ORAL_CAPSULE | Freq: Three times a day (TID) | ORAL | 0 refills | Status: DC | PRN

## 2021-12-02 MED ORDER — FLUTICASONE PROPIONATE 50 MCG/ACT NA SUSP: 2.0000 | Freq: Every day | NASAL | 6 refills | Status: DC

## 2021-12-02 NOTE — Progress Notes (Signed)
Acute Office Visit  Subjective:    Patient ID: Jimmy Hall, male    DOB: April 21, 1956, 66 y.o.   MRN: [id number]  Chief Complaint  Patient presents with   cough, congestion    Cough This is a recurrent problem. The current episode started 1 to 4 weeks ago. The problem has been unchanged. The problem occurs constantly. The cough is Non-productive. Associated symptoms include headaches, nasal congestion and postnasal drip. Pertinent negatives include no chest pain, ear congestion, ear pain, rash or sore throat. Nothing aggravates the symptoms.   Past Medical History:  Diagnosis Date   Chronic back pain    spondylolisthesis   History of bronchitis    49yr ago and only one time   History of gout    not on any meds   Joint pain    knees   Numbness    left 4th and 5th finger   Seasonal allergies    takes OTC meds maybe once a week    Past Surgical History:  Procedure Laterality Date   ANTERIOR CERVICAL DECOMP/DISCECTOMY FUSION N/A 12/29/2013   Procedure: ANTERIOR CERVICAL DECOMPRESSION/DISCECTOMY FUSION 3 LEVELS;  Surgeon: EFloyce Stakes MD;  Location: [facility];  Service: Neurosurgery;  Laterality: N/A;  Anterior Cervical Three-Four/Four-Five/Five-Six  Decompression/Diskectomy/Fusion   BACK SURGERY  02/2014   HERNIA REPAIR      Family History  Problem Relation Age of Onset   Stroke Mother     Social History   Socioeconomic History   Marital status: Widowed    Spouse name: Not on file   Number of children: 4   Years of education: Not on file   Highest education level: 12th grade  Occupational History   Not on file  Tobacco Use   Smoking status: Former    Packs/day: 0.50    Years: 6.00    Pack years: 3.00    Types: Cigarettes    Start date: 03/07/1974    Quit date: 09/24/1991    Years since quitting: 30.2   Smokeless tobacco: Never  Vaping Use   Vaping Use: Never used  Substance and Sexual Activity   Alcohol use: Not Currently    Comment: beer  occasionally   Drug use: No   Sexual activity: Not on file  Other Topics Concern   Not on file  Social History Narrative   Recently widowed, 4 children all live locally , 7 grandchildren, 2 grandchildren live with him   Social Determinants of HRadio broadcast assistantStrain: Low Risk    Difficulty of Paying Living Expenses: Not hard at all  Food Insecurity: Food Insecurity Present   Worried About RCharity fundraiserin the Last Year: Sometimes true   RArboriculturistin the Last Year: Sometimes true  Transportation Needs: Not on file  Physical Activity: Not on file  Stress: Not on file  Social Connections: Moderately Integrated   Frequency of Communication with Friends and Family: More than three times a week   Frequency of Social Gatherings with Friends and Family: More than three times a week   Attends Religious Services: More than 4 times per year   Active Member of [employer] Organizations: Yes   Attends CArchivistMeetings: More than 4 times per year   Marital Status: Widowed  Intimate Partner Violence: Not on file    Outpatient Medications Prior to Visit  Medication Sig Dispense Refill   Vitamin D, Ergocalciferol, (DRISDOL) 1.25 MG (50000 UNIT)  CAPS capsule Take 1 capsule (50,000 Units total) by mouth every 7 (seven) days. 13 capsule 3   amoxicillin-clavulanate (AUGMENTIN) 875-125 MG tablet Take 1 tablet by mouth 2 (two) times daily. Take all of this medication 20 tablet 0   No facility-administered medications prior to visit.    Allergies  Allergen Reactions   Cyclobenzaprine Other (See Comments)    Drowsiness   Ibuprofen Nausea Only    Review of Systems  Constitutional: Negative.   HENT:  Positive for congestion and postnasal drip. Negative for ear pain, sinus pressure and sore throat.   Eyes: Negative.   Respiratory:  Positive for cough.   Cardiovascular: Negative.  Negative for chest pain.  Gastrointestinal: Negative.   Genitourinary: Negative.    Musculoskeletal: Negative.   Skin:  Negative for rash.  Neurological:  Positive for headaches.  All other systems reviewed and are negative.     Objective:    Physical Exam Vitals reviewed.  Constitutional:      Appearance: Normal appearance.  HENT:     Head: Normocephalic.     Right Ear: External ear normal.     Left Ear: External ear normal.     Nose: Congestion present.  Eyes:     Conjunctiva/sclera: Conjunctivae normal.  Cardiovascular:     Rate and Rhythm: Normal rate and regular rhythm.     Pulses: Normal pulses.     Heart sounds: Normal heart sounds.  Pulmonary:     Effort: Pulmonary effort is normal.     Breath sounds: Normal breath sounds.  Abdominal:     General: Bowel sounds are normal.  Musculoskeletal:        General: Normal range of motion.  Skin:    General: Skin is warm.     Findings: No rash.  Neurological:     Mental Status: He is alert and oriented to person, place, and time.  Psychiatric:        Behavior: Behavior normal.    BP (!) 137/92    Pulse 81    Temp 98.8 F (37.1 C)    Ht '5\' 10"'  (1.778 m)    Wt 236 lb (107 kg)    SpO2 97%    BMI 33.86 kg/m  Wt Readings from Last 3 Encounters:  12/02/21 236 lb (107 kg)  10/28/21 238 lb 12.8 oz (108.3 kg)  09/10/21 242 lb 3.2 oz (109.9 kg)    Health Maintenance Due  Topic Date Due   COLONOSCOPY (Pts 45-41yr Insurance coverage will need to be confirmed)  Never done   COVID-19 Vaccine (3 - Booster for Moderna series) 03/28/2020    There are no preventive care reminders to display for this patient.   Lab Results  Component Value Date   TSH 1.150 11/14/2013   Lab Results  Component Value Date   WBC 8.3 10/28/2021   HGB 15.9 10/28/2021   HCT 48.8 10/28/2021   MCV 88 10/28/2021   PLT 253 10/28/2021   Lab Results  Component Value Date   NA 142 10/28/2021   K 4.7 10/28/2021   CO2 24 10/28/2021   GLUCOSE 97 10/28/2021   BUN 14 10/28/2021   CREATININE 1.47 (H) 10/28/2021   BILITOT 0.4  10/28/2021   ALKPHOS 64 10/28/2021   AST 16 10/28/2021   ALT 17 10/28/2021   PROT 7.3 10/28/2021   ALBUMIN 4.6 10/28/2021   CALCIUM 10.0 10/28/2021   ANIONGAP 10 04/20/2020   EGFR 53 (L) 10/28/2021   Lab Results  Component  Value Date   CHOL 234 (H) 04/22/2021   Lab Results  Component Value Date   HDL 63 04/22/2021   Lab Results  Component Value Date   LDLCALC 158 (H) 04/22/2021   Lab Results  Component Value Date   TRIG 77 04/22/2021   Lab Results  Component Value Date   CHOLHDL 3.7 04/22/2021   Lab Results  Component Value Date   HGBA1C 5.5 10/28/2021       Assessment & Plan:  Take meds as prescribed - Use a cool mist humidifier  -Use saline nose sprays frequently -Force fluids -For fever or aches or pains- take Tylenol or ibuprofen. -If symptoms do not improve, he may need to be COVID tested to rule this out Follow up with worsening unresolved symptoms  Problem List Items Addressed This Visit   None Visit Diagnoses     Upper respiratory infection with cough and congestion    -  Primary   Relevant Medications   fluticasone (FLONASE) 50 MCG/ACT nasal spray   cetirizine (ZYRTEC ALLERGY) 10 MG tablet   Subacute cough       Relevant Medications   benzonatate (TESSALON PERLES) 100 MG capsule        Meds ordered this encounter  Medications   benzonatate (TESSALON PERLES) 100 MG capsule    Sig: Take 1 capsule (100 mg total) by mouth 3 (three) times daily as needed.    Dispense:  20 capsule    Refill:  0    Order Specific Question:   Supervising Provider    Answer:   Claretta Fraise [[id number]]   fluticasone (FLONASE) 50 MCG/ACT nasal spray    Sig: Place 2 sprays into both nostrils daily.    Dispense:  16 g    Refill:  6    Order Specific Question:   Supervising Provider    Answer:   Claretta Fraise [[id number]]   cetirizine (ZYRTEC ALLERGY) 10 MG tablet    Sig: Take 1 tablet (10 mg total) by mouth daily.    Dispense:  30 tablet    Refill:  0    Order  Specific Question:   Supervising Provider    Answer:   Claretta Fraise [[id number]]     Ivy Lynn, NP

## 2021-12-02 NOTE — Patient Instructions (Signed)
Follow up with unresolved symptoms Cough, Adult A cough helps to clear your throat and lungs. A cough may be a sign of an illness or another medical condition. An acute cough may only last 2-3 weeks, while a chronic cough may last 8 or more weeks. Many things can cause a cough. They include: Germs (viruses or bacteria) that attack the airway. Breathing in things that bother (irritate) your lungs. Allergies. Asthma. Mucus that runs down the back of your throat (postnasal drip). Smoking. Acid backing up from the stomach into the tube that moves food from the mouth to the stomach (gastroesophageal reflux). Some medicines. Lung problems. Other medical conditions, such as heart failure or a blood clot in the lung (pulmonary embolism). Follow these instructions at home: Medicines Take over-the-counter and prescription medicines only as told by your doctor. Talk with your doctor before you take medicines that stop a cough (cough suppressants). Lifestyle  Do not smoke, and try not to be around smoke. Do not use any products that contain nicotine or tobacco, such as cigarettes, e-cigarettes, and chewing tobacco. If you need help quitting, ask your doctor. Drink enough fluid to keep your pee (urine) pale yellow. Avoid caffeine. Do not drink alcohol if your doctor tells you not to drink. General instructions  Watch for any changes in your cough. Tell your doctor about them. Always cover your mouth when you cough. Stay away from things that make you cough, such as perfume, candles, campfire smoke, or cleaning products. If the air is dry, use a cool mist vaporizer or humidifier in your home. If your cough is worse at night, try using extra pillows to raise your head up higher while you sleep. Rest as needed. Keep all follow-up visits as told by your doctor. This is important. Contact a doctor if: You have new symptoms. You cough up pus. Your cough does not get better after 2-3 weeks, or  your cough gets worse. Cough medicine does not help your cough and you are not sleeping well. You have pain that gets worse or pain that is not helped with medicine. You have a fever. You are losing weight and you do not know why. You have night sweats. Get help right away if: You cough up blood. You have trouble breathing. Your heartbeat is very fast. These symptoms may be an emergency. Do not wait to see if the symptoms will go away. Get medical help right away. Call your local emergency services (911 in the [place] Do not drive yourself to the hospital. Summary A cough helps to clear your throat and lungs. Many things can cause a cough. Take over-the-counter and prescription medicines only as told by your doctor. Always cover your mouth when you cough. Contact a doctor if you have new symptoms or you have a cough that does not get better or gets worse. This information is not intended to replace advice given to you by your health care provider. Make sure you discuss any questions you have with your health care provider. Document Revised: 11/10/2019 Document Reviewed: 10/10/2018 Elsevier Patient Education  [place]. Upper Respiratory Infection, Adult An upper respiratory infection (URI) affects the nose, throat, and upper airways that lead to the lungs. The most common type of URI is often called the common cold. URIs usually get better on their own, without medical treatment. What are the causes? A URI is caused by a germ (virus). You may catch these germs by: Breathing in droplets from an infected person's cough  or sneeze. Touching something that has the germ on it (is contaminated) and then touching your mouth, nose, or eyes. What increases the risk? You are more likely to get a URI if: You are very young or very old. You have close contact with others, such as at work, school, or a health care facility. You smoke. You have long-term (chronic) heart or lung disease. You  have a weakened disease-fighting system (immune system). You have nasal allergies or asthma. You have a lot of stress. You have poor nutrition. What are the signs or symptoms? Runny or stuffy (congested) nose. Cough. Sneezing. Sore throat. Headache. Feeling tired (fatigue). Fever. Not wanting to eat as much as usual. Pain in your forehead, behind your eyes, and over your cheekbones (sinus pain). Muscle aches. Redness or irritation of the eyes. Pressure in the ears or face. How is this treated? URIs usually get better on their own within 7-10 days. Medicines cannot cure URIs, but your doctor may recommend certain medicines to help relieve symptoms, such as: Over-the-counter cold medicines. Medicines to reduce coughing (cough suppressants). Coughing is a type of defense against infection that helps to clear the nose, throat, windpipe, and lungs (respiratory system). Take these medicines only as told by your doctor. Medicines to lower your fever. Follow these instructions at home: Activity Rest as needed. If you have a fever, stay home from work or school until your fever is gone, or until your doctor says you may return to work or school. You should stay home until you cannot spread the infection anymore (you are not contagious). Your doctor may have you wear a face mask so you have less risk of spreading the infection. Relieving symptoms Rinse your mouth often with salt water. To make salt water, dissolve -1 tsp (3-6 g) of salt in 1 cup (237 mL) of warm water. Use a cool-mist humidifier to add moisture to the air. This can help you breathe more easily. Eating and drinking  Drink enough fluid to keep your pee (urine) pale yellow. Eat soups and other clear broths. General instructions  Take over-the-counter and prescription medicines only as told by your doctor. Do not smoke or use any products that contain nicotine or tobacco. If you need help quitting, ask your doctor. Avoid  being where people are smoking (avoid secondhand smoke). Stay up to date on all your shots (immunizations), and get the flu shot every year. Keep all follow-up visits. How to prevent the spread of infection to others  Wash your hands with soap and water for at least 20 seconds. If you cannot use soap and water, use hand sanitizer. Avoid touching your mouth, face, eyes, or nose. Cough or sneeze into a tissue or your sleeve or elbow. Do not cough or sneeze into your hand or into the air. Contact a doctor if: You are getting worse, not better. You have any of these: A fever or chills. Brown or red mucus in your nose. Yellow or brown fluid (discharge)coming from your nose. Pain in your face, especially when you bend forward. Swollen neck glands. Pain when you swallow. White areas in the back of your throat. Get help right away if: You have shortness of breath that gets worse. You have very bad or constant: Headache. Ear pain. Pain in your forehead, behind your eyes, and over your cheekbones (sinus pain). Chest pain. You have long-lasting (chronic) lung disease along with any of these: Making high-pitched whistling sounds when you breathe, most often when you breathe  out (wheezing). Long-lasting cough (more than 14 days). Coughing up blood. A change in your usual mucus. You have a stiff neck. You have changes in your: Vision. Hearing. Thinking. Mood. These symptoms may be an emergency. Get help right away. Call 911. Do not wait to see if the symptoms will go away. Do not drive yourself to the hospital. Summary An upper respiratory infection (URI) is caused by a germ (virus). The most common type of URI is often called the common cold. URIs usually get better within 7-10 days. Take over-the-counter and prescription medicines only as told by your doctor. This information is not intended to replace advice given to you by your health care provider. Make sure you discuss any  questions you have with your health care provider. Document Revised: 04/23/2021 Document Reviewed: 04/23/2021 Elsevier Patient Education  [place].

## 2021-12-22 ENCOUNTER — Encounter: Payer: Self-pay | Admitting: Family

## 2021-12-22 ENCOUNTER — Ambulatory Visit (INDEPENDENT_AMBULATORY_CARE_PROVIDER_SITE_OTHER): Payer: Medicare Other

## 2021-12-22 ENCOUNTER — Ambulatory Visit (INDEPENDENT_AMBULATORY_CARE_PROVIDER_SITE_OTHER): Payer: Medicare Other | Admitting: Family

## 2021-12-22 VITALS — BP 135/89 | HR 84 | Temp 97.7°F | Ht 70.0 in | Wt 240.0 lb

## 2021-12-22 DIAGNOSIS — M25461 Effusion, right knee: Secondary | ICD-10-CM | POA: Diagnosis not present

## 2021-12-22 DIAGNOSIS — M25561 Pain in right knee: Secondary | ICD-10-CM

## 2021-12-22 MED ORDER — DICLOFENAC SODIUM 75 MG PO TBEC: 75.0000 mg | DELAYED_RELEASE_TABLET | Freq: Two times a day (BID) | ORAL | 0 refills | Status: DC

## 2021-12-22 MED ORDER — PREDNISONE 10 MG (21) PO TBPK: ORAL_TABLET | ORAL | 0 refills | Status: DC

## 2021-12-22 NOTE — Progress Notes (Signed)
? ?Subjective:  ? ? Patient ID: Jimmy Hall, male    DOB: November 02, 1955, 66 y.o.   MRN: [id number] ? ?Chief Complaint  ?Patient presents with  ? Knee Injury  ? ?PT presents to the office today with right knee pain that started Thursday after walking. States he heard a "pop" and just kept working. Since he has had increased swelling and pain.  ?Knee Pain  ?The incident occurred 3 to 5 days ago. The pain is present in the right knee. The quality of the pain is described as aching. The pain is at a severity of 7/10. The pain is mild. The pain has been Intermittent since onset. Pertinent negatives include no loss of motion, loss of sensation, numbness or tingling. He reports no foreign bodies present. The symptoms are aggravated by weight bearing and movement. He has tried non-weight bearing and NSAIDs for the symptoms. The treatment provided mild relief.  ? ? ? ?Review of Systems  ?Neurological:  Negative for tingling and numbness.  ?All other systems reviewed and are negative. ? ?   ?Objective:  ? Physical Exam ?Vitals reviewed.  ?Constitutional:   ?   General: He is not in acute distress. ?   Appearance: He is well-developed. He is obese.  ?HENT:  ?   Head: Normocephalic.  ?Eyes:  ?   General:     ?   Right eye: No discharge.     ?   Left eye: No discharge.  ?   Pupils: Pupils are equal, round, and reactive to light.  ?Neck:  ?   Thyroid: No thyromegaly.  ?Cardiovascular:  ?   Rate and Rhythm: Normal rate and regular rhythm.  ?   Heart sounds: Normal heart sounds. No murmur heard. ?Pulmonary:  ?   Effort: Pulmonary effort is normal. No respiratory distress.  ?   Breath sounds: Normal breath sounds. No wheezing.  ?Abdominal:  ?   General: Bowel sounds are normal. There is no distension.  ?   Palpations: Abdomen is soft.  ?   Tenderness: There is no abdominal tenderness.  ?Musculoskeletal:     ?   General: Swelling and tenderness present.  ?   Cervical back: Normal range of motion and neck supple.  ?   Comments: Pain in  right with flexion and extension, 2+ edema   ?Skin: ?   General: Skin is warm and dry.  ?   Findings: No erythema or rash.  ?Neurological:  ?   Mental Status: He is alert and oriented to person, place, and time.  ?   Cranial Nerves: No cranial nerve deficit.  ?   Deep Tendon Reflexes: Reflexes are normal and symmetric.  ?Psychiatric:     ?   Behavior: Behavior normal.     ?   Thought Content: Thought content normal.     ?   Judgment: Judgment normal.  ? ? ? ?BP 135/89   Pulse 84   Temp 97.7 ?F (36.5 ?C) (Temporal)   Ht 5\' 10"  (1.778 m)   Wt 240 lb (108.9 kg)   BMI 34.44 kg/m?  ? ? ?   ?Assessment & Plan:  ? ?Jimmy Hall comes in today with chief complaint of Knee Injury ? ? ?Diagnosis and orders addressed: ? ?1. Right knee pain, unspecified chronicity ?- DG Knee 1-2 Views Right; Future ?- predniSONE (STERAPRED UNI-PAK 21 TAB) 10 MG (21) TBPK tablet; Use as directed  Dispense: 21 tablet; Refill: 0 ?- diclofenac (VOLTAREN) 75 MG  EC tablet; Take 1 tablet (75 mg total) by mouth 2 (two) times daily.  Dispense: 30 tablet; Refill: 0 ?- Ambulatory referral to Physical Therapy ? ?2. Effusion of right knee ?- predniSONE (STERAPRED UNI-PAK 21 TAB) 10 MG (21) TBPK tablet; Use as directed  Dispense: 21 tablet; Refill: 0 ?- diclofenac (VOLTAREN) 75 MG EC tablet; Take 1 tablet (75 mg total) by mouth 2 (two) times daily.  Dispense: 30 tablet; Refill: 0 ?- Ambulatory referral to Physical Therapy ? ?Rest ?Ice  ?No other NSAID's while taking Diclofenac  ?Referral to PT ?If pain is not improved, may need MRI  ? ? ?Jannifer Rodney, FNP ? ?

## 2021-12-22 NOTE — Patient Instructions (Signed)
Knee Effusion ?Knee effusion refers to excess fluid in the knee joint. This can cause pain and swelling in your knee. Knee effusion creates more pressure than usual in your knee joint. This makes it more difficult for you to bend and move your knee. If there is fluid in your knee, it often means that something is wrong inside your knee. This can be a result of: ?Severe arthritis. ?Injury to the knee muscles or to tissues in the knee (ligaments or cartilage). ?Infection. ?Autoimmune disease. This means that your body's defense system (immune system) mistakenly attacks healthy body tissues. ?Follow these instructions at home: ?If you have a brace or an immobilizer: ?Wear it as told by your health care provider. Remove it only as told by your health care provider. ?Check the skin around it every day. Tell your health care provider about any concerns. ?Loosen it if your toes tingle, become numb, or turn cold and blue. ?Keep it clean. ?If the brace or immobilizer is not waterproof: ?Do not let it get wet. ?Cover it with a watertight covering when you take a bath or shower. ?Managing pain, stiffness, and swelling ? ?If directed, put ice on the affected area. To do this: ?If you have a removable brace or immobilizer, remove it as told by your health care provider. ?Put ice in a plastic bag. ?Place a towel between your skin and the bag. ?Leave the ice on for 20 minutes, 2-3 times a day. ?Remove the ice if your skin turns bright red. This is very important. If you cannot feel pain, heat, or cold, you have a greater risk of damage to the area. ?Move your toes often to reduce stiffness and swelling. ?Raise (elevate) your knee above the level of your heart while you are sitting or lying down. ?Activity ?Rest as told by your health care provider. ?Do not use the injured limb to support your body weight until your health care provider says that you can. Use crutches as told by your health care provider. ?Do exercises as told by  your health care provider. ?General instructions ?Take over-the-counter and prescription medicines only as told by your health care provider. ?Do not use any products that contain nicotine or tobacco. These products include cigarettes, chewing tobacco, and vaping devices, such as e-cigarettes. If you need help quitting, ask your health care provider. ?Wear an elastic bandage or a wrap that puts pressure on your knee (compression wrap) as told by your health care provider. ?Keep all follow-up visits. This is important. ?Contact a health care provider if: ?You continue to have pain in your knee. ?You have a fever or chills. ?Get help right away if: ?You have swelling or redness in your knee that gets worse or does not get better. ?You have severe pain in your knee. ?Summary ?Knee effusion refers to excess fluid in the knee joint. This causes pain and swelling and makes it difficult to bend and move your knee. ?Effusion may be caused by severe arthritis, autoimmune disease, infection, or injury to the knee muscles or to tissues in the knee (ligaments or cartilage). ?Take over-the-counter and prescription medicines only as told by your health care provider. ?If you have a brace or an immobilizer, wear it as told by your health care provider. ?This information is not intended to replace advice given to you by your health care provider. Make sure you discuss any questions you have with your health care provider. ?Document Revised: 05/22/2020 Document Reviewed: 05/22/2020 ?Elsevier Patient Education ?  [place]. ? ?

## 2021-12-24 ENCOUNTER — Other Ambulatory Visit: Payer: Self-pay | Admitting: Nurse Practitioner

## 2021-12-24 ENCOUNTER — Other Ambulatory Visit: Payer: Self-pay

## 2021-12-24 ENCOUNTER — Ambulatory Visit (INDEPENDENT_AMBULATORY_CARE_PROVIDER_SITE_OTHER): Payer: Self-pay | Admitting: *Deleted

## 2021-12-24 VITALS — Ht 70.0 in | Wt 242.0 lb

## 2021-12-24 DIAGNOSIS — J069 Acute upper respiratory infection, unspecified: Secondary | ICD-10-CM

## 2021-12-24 DIAGNOSIS — Z1211 Encounter for screening for malignant neoplasm of colon: Secondary | ICD-10-CM

## 2021-12-24 NOTE — Progress Notes (Addendum)
Gastroenterology Pre-Procedure Review ? ?Request Date: 12/24/2021 ?Requesting Physician: Dr. Mechele Claude at [facility], no previous TCS ? ?PATIENT REVIEW QUESTIONS: The patient responded to the following health history questions as indicated:   ? ?1. Diabetes Melitis: no ?2. Joint replacements in the past 12 months: no ?3. Major health problems in the past 3 months: no ?4. Has an artificial valve or MVP: no ?5. Has a defibrillator: no ?6. Has been advised in past to take antibiotics in advance of a procedure like teeth cleaning: no ?7. Family history of colon cancer: no  ?8. Alcohol Use: no ?9. Illicit drug Use: no ?10. History of sleep apnea: no  ?11. History of coronary artery or other vascular stents placed within the last 12 months: no ?12. History of any prior anesthesia complications: no ?13. Body mass index is 34.72 kg/m?. ?   ?MEDICATIONS & ALLERGIES:    ?Patient reports the following regarding taking any blood thinners:   ?Plavix? no ?Aspirin? no ?Coumadin? no ?Brilinta? no ?Xarelto? no ?Eliquis? no ?Pradaxa? no ?Savaysa? no ?Effient? no ? ?Patient confirms/reports the following medications:  ?Current Outpatient Medications  ?Medication Sig Dispense Refill  ? cetirizine (ZYRTEC) 10 MG tablet TAKE 1 TABLET BY MOUTH EVERY DAY 30 tablet 9  ? diclofenac (VOLTAREN) 75 MG EC tablet Take 1 tablet (75 mg total) by mouth 2 (two) times daily. 30 tablet 0  ? Ferrous Sulfate (IRON) 325 (65 Fe) MG TABS Take by mouth as needed.    ? fluticasone (FLONASE) 50 MCG/ACT nasal spray Place 2 sprays into both nostrils daily. (Patient taking differently: Place 2 sprays into both nostrils as needed.) 16 g 6  ? Garlic 500 MG TABS Take by mouth as needed.    ? Omega-3 Fatty Acids (FISH OIL) 1000 MG CAPS Take by mouth daily at 6 (six) AM.    ? predniSONE (STERAPRED UNI-PAK 21 TAB) 10 MG (21) TBPK tablet Use as directed 21 tablet 0  ? Vitamin D, Ergocalciferol, (DRISDOL) 1.25 MG (50000 UNIT) CAPS capsule Take  1 capsule (50,000 Units total) by mouth every 7 (seven) days. 13 capsule 3  ? ?No current facility-administered medications for this visit.  ? ? ?Patient confirms/reports the following allergies:  ?Allergies  ?Allergen Reactions  ? Cyclobenzaprine Other (See Comments)  ?  Drowsiness  ? Ibuprofen Nausea Only  ? ? ?No orders of the defined types were placed in this encounter. ? ? ?AUTHORIZATION INFORMATION ?Primary Insurance: [employer] Medicare,  ID #: [id number],  Group #: [id number] ?Pre-Cert / Berkley Harvey required: Yes, approved online 02/10/2022-05/11/2022 ?Pre-Cert / Auth #: [id number] ? ?Secondary Insurance: [place],  [place] #: [id number],  Group #: [id number] ?Pre-Cert / Berkley Harvey required: No, not required ? ?SCHEDULE INFORMATION: ?Procedure has been scheduled as follows:  ?Date: 02/10/2022, Time: 10:00 ?Location: [facility] with Dr. Marletta Lor ? ?This Gastroenterology Pre-Precedure Review Form is being routed to the following provider(s): Lewie Loron, NP  ?  ?

## 2021-12-30 ENCOUNTER — Other Ambulatory Visit: Payer: Self-pay

## 2021-12-30 ENCOUNTER — Ambulatory Visit: Payer: Medicare Other | Attending: Family

## 2021-12-30 DIAGNOSIS — M25561 Pain in right knee: Secondary | ICD-10-CM | POA: Insufficient documentation

## 2021-12-30 DIAGNOSIS — M25461 Effusion, right knee: Secondary | ICD-10-CM | POA: Diagnosis not present

## 2021-12-30 NOTE — Therapy (Signed)
[facility] ?[address] ?[place], [place], [address] ?Phone: [phone]   Fax:  [phone] ? ?Physical Therapy Evaluation ? ?Patient Details  ?Name: Jimmy ReichertJoe L Hall ?MRN: [id number] ?Date of Birth: 12/15/1955 ?Referring Provider (PT): Lendon ColonelHawks, FNP ? ? ?Encounter Date: 12/30/2021 ? ? PT End of Session - 12/30/21 0902   ? ? Visit Number 1   ? Number of Visits 4   ? Date for PT Re-Evaluation 02/27/22   ? PT Start Time [phone]   ? PT Stop Time 0944   ? PT Time Calculation (min) 41 min   ? Activity Tolerance Patient tolerated treatment well   ? Behavior During Therapy [facility] for tasks assessed/performed   ? ?  ?  ? ?  ? ? ?Past Medical History:  ?Diagnosis Date  ? Chronic back pain   ? spondylolisthesis  ? History of bronchitis   ? 5175yrs ago and only one time  ? History of gout   ? not on any meds  ? Joint pain   ? knees  ? Numbness   ? left 4th and 5th finger  ? Seasonal allergies   ? takes OTC meds maybe once a week  ? ? ?Past Surgical History:  ?Procedure Laterality Date  ? ANTERIOR CERVICAL DECOMP/DISCECTOMY FUSION N/A 12/29/2013  ? Procedure: ANTERIOR CERVICAL DECOMPRESSION/DISCECTOMY FUSION 3 LEVELS;  Surgeon: Karn CassisErnesto M Botero, MD;  Location: [facility];  Service: Neurosurgery;  Laterality: N/A;  Anterior Cervical Three-Four/Four-Five/Five-Six  Decompression/Diskectomy/Fusion  ? BACK SURGERY  02/2014  ? HERNIA REPAIR    ? ? ?There were no vitals filed for this visit. ? ? ? Subjective Assessment - 12/30/21 0902   ? ? Subjective Patient reports that his knee feels a lot better today. He began experiencing right knee pain on 3/16. He was walking through his house when he felt a pop in his knee. He notes that it was really swollen for about 3-4 days. He had an x-ray which only showed OA in his knee. He has not had any injuries to his right knee before. He reports no popping or cl\icking in his knee.   ? Pertinent History history of gout, chronic neck and back pain   ? Patient Stated  Goals improved knee strength   ? Currently in Pain? Yes   ? Pain Score 0-No pain   ? Pain Location Knee   ? Pain Orientation Right   ? Pain Type Acute pain   ? Pain Radiating Towards pain was localized to the right knee   ? Pain Onset 1 to 4 weeks ago   ? Pain Frequency Intermittent   ? Aggravating Factors  "twisting the wrong way.   ? Pain Relieving Factors medication   ? Effect of Pain on Daily Activities he is able to do his normal daily activities   ? ?  ?  ? ?  ? ? ? ? ? [facility] PT Assessment - 12/30/21 0001   ? ?  ? Assessment  ? Medical Diagnosis Right knee pain   ? Referring Provider (PT) Lendon ColonelHawks, FNP   ? Onset Date/Surgical Date 12/18/21   ? Next MD Visit None   ? Prior Therapy No   ?  ? Precautions  ? Precautions None   ?  ? Restrictions  ? Weight Bearing Restrictions No   ?  ? Balance Screen  ? Has the patient fallen in the past 6 months Yes   ? How many times? 1   patient  reports that he missed a step when walking out of his house  ? Has the patient had a decrease in activity level because of a fear of falling?  No   ? Is the patient reluctant to leave their home because of a fear of falling?  No   ?  ? Home Environment  ? Living Environment Private residence   ? Type of Home Apartment   ? Home Access Stairs to enter   ? Entrance Stairs-Number of Steps 10   ? Entrance Stairs-Rails Can reach both   ? Home Layout One level   ?  ? Prior Function  ? Level of Independence Independent   ? Vocation Retired   ? Leisure walking, exercising   ?  ? Cognition  ? Overall Cognitive Status Within Functional Limits for tasks assessed   ? Memory Appears intact   ? Awareness Appears intact   ? Problem Solving Appears intact   ?  ? Observation/Other Assessments  ? Focus on Therapeutic Outcomes (FOTO)  64.05   ?  ? Sensation  ? Additional Comments Patient reports no numbness or tingling   ?  ? Functional Tests  ? Functional tests Squat   ?  ? Squat  ? Comments Bilateral hip ER, significant trunk flexion   ?  ? ROM / Strength  ?  AROM / PROM / Strength Strength;AROM   ?  ? AROM  ? AROM Assessment Site Knee   ? Right/Left Knee Left;Right   ? Right Knee Extension 0   ? Right Knee Flexion 119   ? Left Knee Extension 0   ? Left Knee Flexion 127   ?  ? Strength  ? Strength Assessment Site Hip;Knee   ? Right/Left Hip Right;Left   ? Right Hip Flexion 4/5   ? Left Hip Flexion 4+/5   ? Right/Left Knee Right;Left   ? Right Knee Flexion 4+/5   ? Right Knee Extension 5/5   ? Left Knee Flexion 4+/5   ? Left Knee Extension 5/5   ?  ? Palpation  ? Palpation comment No tenderness to palpation   ?  ? Special Tests  ?  Special Tests Knee Special Tests;Meniscus Tests   ? Knee Special tests  Patellofemoral Grind Test (Clarke's Sign);other   ? Meniscus Tests McMurray Test   ?  ? Patellofemoral Grind test [employer])  ? Findings Negative   ? Side  Right   ?  ? other   ? Findings Negative   ? Side  Right   ? Comments ACL, MCL, LCL, and PCL testing   ?  ? McMurray Test  ? Findings Negative   ? Side Right   ?  ? Transfers  ? Transfers Sit to Stand;Stand to Sit   ? Sit to Stand 6: Modified independent (Device/Increase time);Without upper extremity assist   ? Five time sit to stand comments  21 seconds   "pressure on knees" (R>L)  ? Stand to Sit 6: Modified independent (Device/Increase time);Without upper extremity assist   ? ?  ?  ? ?  ? ? ? ? ? ? ? ? ? ? ? ? ? ?Objective measurements completed on examination: See above findings.  ? ? ? ? ? ? ? ? ? ? ? ? ? ? ? ? ? ? ? PT Long Term Goals - 12/30/21 1311   ? ?  ? PT LONG TERM GOAL #1  ? Title Patient will be independent with his HEP.   ?  Time 4   ? Period Weeks   ? Status New   ? Target Date 01/27/22   ?  ? PT LONG TERM GOAL #2  ? Title Patient will be able to demonstrate at least 4+/5 right hip flexion strength for improved function with navigating stairs.   ? Time 4   ? Period Weeks   ? Status New   ? Target Date 01/27/22   ?  ? PT LONG TERM GOAL #3  ? Title Patient will improved his five time sit to stand time to  13 seconds or less for improved safety and reduce his fall risk.   ? Time 4   ? Period Weeks   ? Status New   ? Target Date 01/27/22   ?  ? PT LONG TERM GOAL #4  ? Title Patient will be able to demonstrate proper squat mechanics for improved safety lifting.   ? Time 4   ? Period Weeks   ? Status New   ? Target Date 01/27/22   ? ?  ?  ? ?  ? ? ? ? ? ? ? ? ? Plan - 12/30/21 1002   ? ? Clinical Impression Statement Patient is a 66 year old male presenting to physical therapy following a right knee injury on 12/18/21. He presented today with no knee pain or discomfort with any of today's testing. However, he reported intermittent pain with twisting on the right lower extremity. He exhibited minimal lower extremity weakness with this being his primary impairment. Recommend that he continue with skilled physical therapy to address his impairments to return to his prior level of function.   ? Personal Factors and Comorbidities Comorbidity 1;Other   ? Comorbidities OA   ? Examination-Activity Limitations Transfers;Other   ? Examination-Participation Restrictions [employer]   ? Stability/Clinical Decision Making Stable/Uncomplicated   ? Clinical Decision Making Low   ? Rehab Potential Excellent   ? PT Frequency 1x / week   ? PT Duration 4 weeks   ? PT Treatment/Interventions Cryotherapy;Electrical Stimulation;Neuromuscular re-education;Therapeutic exercise;Therapeutic activities;Functional mobility training;Patient/family education;Manual techniques;Passive range of motion;Vasopneumatic Device;Taping   ? PT Next Visit Plan recumbent bike, lower extremity strengthening, modalities as needed   ? Consulted and Agree with Plan of Care Patient   ? ?  ?  ? ?  ? ? ?Patient will benefit from skilled therapeutic intervention in order to improve the following deficits and impairments:  Decreased activity tolerance, Improper body mechanics, Decreased strength ? ?Visit Diagnosis: ?Acute pain of right knee ? ? ? ? ?Problem  List ?Patient Active Problem List  ? Diagnosis Date Noted  ? Upper respiratory tract infection 09/10/2021  ? Cephalalgia 04/02/2021  ? Neck pain 04/02/2021  ? Spondylolisthesis of lumbar region 03/02/2014  ? Cer

## 2022-01-01 NOTE — Progress Notes (Signed)
ASA 2. Hold iron per protocol.  ?

## 2022-01-02 NOTE — Progress Notes (Signed)
Spoke to pt.  Scheduled procedure for 02/10/2022 at 10:00, arrival 8:30 at [facility].  Pt informed me that he would like to call me back before reviewing instructions.  He needs to make sure he will have transportation. ?

## 2022-01-03 ENCOUNTER — Other Ambulatory Visit: Payer: Self-pay | Admitting: Family

## 2022-01-03 DIAGNOSIS — M25461 Effusion, right knee: Secondary | ICD-10-CM

## 2022-01-03 DIAGNOSIS — M25561 Pain in right knee: Secondary | ICD-10-CM

## 2022-01-05 NOTE — Telephone Encounter (Signed)
Last seen by [facility] on 12/22/21 ?

## 2022-01-06 ENCOUNTER — Ambulatory Visit: Payer: Medicare Other | Attending: Family

## 2022-01-06 DIAGNOSIS — M25561 Pain in right knee: Secondary | ICD-10-CM | POA: Insufficient documentation

## 2022-01-06 NOTE — Therapy (Addendum)
[facility] [place], [place], [address] Phone: [phone]   Fax:  [phone]  Physical Therapy Treatment  Patient Details  Name: Jimmy Hall MRN: [id number] Date of Birth: [birth year] Referring Provider (PT): [place], [place]   Encounter Date: 01/06/2022   PT End of Session - 01/06/22 0905     Visit Number 2    Number of Visits 4    Date for PT Re-Evaluation 02/27/22    PT Start Time 0901    PT Stop Time 0945    PT Time Calculation (min) 44 min    Activity Tolerance Patient tolerated treatment well    Behavior During Therapy [facility] for tasks assessed/performed             Past Medical History:  Diagnosis Date   Chronic back pain    spondylolisthesis   History of bronchitis    74yr ago and only one time   History of gout    not on any meds   Joint pain    knees   Numbness    left 4th and 5th finger   Seasonal allergies    takes OTC meds maybe once a week    Past Surgical History:  Procedure Laterality Date   ANTERIOR CERVICAL DECOMP/DISCECTOMY FUSION N/A 12/29/2013   Procedure: ANTERIOR CERVICAL DECOMPRESSION/DISCECTOMY FUSION 3 LEVELS;  Surgeon: EFloyce Stakes MD;  Location: [facility];  Service: Neurosurgery;  Laterality: N/A;  Anterior Cervical Three-Four/Four-Five/Five-Six  Decompression/Diskectomy/Fusion   BACK SURGERY  02/2014   HERNIA REPAIR      There were no vitals filed for this visit.   Subjective Assessment - 01/06/22 0904     Subjective Patient reports that his right knee feels good. However, he felt his left knee pop last night.    Pertinent History history of gout, chronic neck and back pain    Patient Stated Goals improved knee strength    Pain Onset 1 to 4 weeks ago                               [facility] 01[facility]06[facility]22 0001       Exercises   Exercises Knee/Hip      Knee/Hip Exercises: Aerobic   Recumbent Bike L3 x 15 minutes       Knee/Hip Exercises: Standing   Knee Flexion Both;20 reps    Knee Flexion Limitations 5 pound ankle weights    Forward Lunges Both;2 sets;10 reps   onto 6" step   Rocker Board 4 minutes      Knee/Hip Exercises: Seated   Long Arc Quad Both;20 reps;Weights    Long Arc Quad Weight 5 lbs.                          PT Long Term Goals - 12/30/21 1311       PT LONG TERM GOAL #1   Title Patient will be independent with his HEP.    Time 4    Period Weeks    Status New    Target Date 01/27/22      PT LONG TERM GOAL #2   Title Patient will be able to demonstrate at least 4+/5 right hip flexion strength for improved function with navigating stairs.    Time 4    Period Weeks    Status New    Target Date 01/27/22  PT LONG TERM GOAL #3   Title Patient will improved his five time sit to stand time to 13 seconds or less for improved safety and reduce his fall risk.    Time 4    Period Weeks    Status New    Target Date 01/27/22      PT LONG TERM GOAL #4   Title Patient will be able to demonstrate proper squat mechanics for improved safety lifting.    Time 4    Period Weeks    Status New    Target Date 01/27/22                   Plan - 01/06/22 0905     Clinical Impression Statement Patient was introduced to multiple new interventions for improved lower extremity strength and symptom reduction. He required minimal cuing with standing knee flexion to prevent hip flexion to isolate hamstring engagement. He reported no pain or discomfort with any of today's interventions. He reported that his knees felt good upon the conclusion of treatment as he was not having any pain. He continues to require skilled physical therapy to address his remaining impairments to return to his prior level of function.    Personal Factors and Comorbidities Comorbidity 1;Other    Comorbidities OA    Examination-Activity Limitations Transfers;Other    Examination-Participation  Restrictions [employer]    Stability/Clinical Decision Making Stable/Uncomplicated    Rehab Potential Excellent    PT Frequency 1x / week    PT Duration 4 weeks    PT Treatment/Interventions Cryotherapy;Electrical Stimulation;Neuromuscular re-education;Therapeutic exercise;Therapeutic activities;Functional mobility training;Patient/family education;Manual techniques;Passive range of motion;Vasopneumatic Device;Taping    PT Next Visit Plan recumbent bike, lower extremity strengthening, modalities as needed    Consulted and Agree with Plan of Care Patient             Patient will benefit from skilled therapeutic intervention in order to improve the following deficits and impairments:  Decreased activity tolerance, Improper body mechanics, Decreased strength  Visit Diagnosis: Acute pain of right knee     Problem List Patient Active Problem List   Diagnosis Date Noted   Upper respiratory tract infection 09/10/2021   Cephalalgia 04/02/2021   Neck pain 04/02/2021   Spondylolisthesis of lumbar region 03/02/2014   Cervical stenosis of spinal canal 12/29/2013   PHYSICAL THERAPY DISCHARGE SUMMARY  Visits from Start of Care: 2  Current functional level related to goals / functional outcomes: Patient was unable to meet his goals for physical therapy in the two visits, including his initial evaluation.    Remaining deficits: Weakness, power, lifting mechanics   Education / Equipment: HEP   Patient agrees to discharge. Patient goals were not met. Patient is being discharged due to not returning since the last visit.   Darlin Coco, PT 01/06/2022, 9:57 AM  [facility] [address] [place], [place], [address] Phone: [phone]   Fax:  [phone]  Name: Jimmy Hall MRN: [id number] Date of Birth: 11/08/1955

## 2022-01-08 ENCOUNTER — Encounter: Payer: Self-pay | Admitting: *Deleted

## 2022-01-08 MED ORDER — PEG 3350-KCL-NA BICARB-NACL 420 G PO SOLR: 4000.0000 mL | Freq: Once | ORAL | 0 refills | Status: AC

## 2022-01-08 NOTE — Addendum Note (Signed)
Addended by: Metro Kung on: 01/08/2022 12:58 PM ? ? Modules accepted: Orders ? ?

## 2022-01-08 NOTE — Progress Notes (Signed)
Spoke to pt.  He confirmed procedure date for 02/10/2022 at 10:00, arrival at 8:30 at [facility].  Reviewed prep instructions with pt by phone.  Pt advised to hold Iron 10 days prior to procedure.  He is aware to pick up prep kit at pharmacy and required OTC items.  Pt aware that I am mailing out instructions. ?

## 2022-01-12 ENCOUNTER — Other Ambulatory Visit: Payer: Self-pay | Admitting: *Deleted

## 2022-01-16 ENCOUNTER — Other Ambulatory Visit: Payer: Self-pay | Admitting: Family Medicine

## 2022-01-16 DIAGNOSIS — M25461 Effusion, right knee: Secondary | ICD-10-CM

## 2022-01-16 DIAGNOSIS — M25561 Pain in right knee: Secondary | ICD-10-CM

## 2022-02-09 ENCOUNTER — Encounter: Payer: Self-pay | Admitting: Nurse Practitioner

## 2022-02-09 ENCOUNTER — Ambulatory Visit (INDEPENDENT_AMBULATORY_CARE_PROVIDER_SITE_OTHER): Payer: Medicare Other | Admitting: Nurse Practitioner

## 2022-02-09 VITALS — BP 111/71 | HR 77 | Temp 98.8°F | Ht 70.0 in | Wt 232.0 lb

## 2022-02-09 DIAGNOSIS — K219 Gastro-esophageal reflux disease without esophagitis: Secondary | ICD-10-CM

## 2022-02-09 DIAGNOSIS — R110 Nausea: Secondary | ICD-10-CM | POA: Diagnosis not present

## 2022-02-09 MED ORDER — ONDANSETRON HCL 4 MG PO TABS: 4.0000 mg | ORAL_TABLET | Freq: Three times a day (TID) | ORAL | 0 refills | Status: DC | PRN

## 2022-02-09 MED ORDER — OMEPRAZOLE MAGNESIUM 20 MG PO TBEC: 20.0000 mg | DELAYED_RELEASE_TABLET | Freq: Every day | ORAL | 1 refills | Status: DC

## 2022-02-09 NOTE — Progress Notes (Signed)
? ? ? ?Acute Office Visit ? ?Subjective:  ? ?  ?Patient ID: Jimmy Hall, male    DOB: 01/21/1956, 66 y.o.   MRN: [id number] ? ?Chief Complaint  ?Patient presents with  ? Nausea  ?  Been going on for about a week now - feels like he has to throw up but has not   ? Abdominal Pain  ?  Gas pain - for about a week now  ? ? ?Gastroesophageal Reflux ?He complains of abdominal pain, heartburn and nausea. He reports no chest pain, no coughing or no sore throat. This is a new problem. The current episode started yesterday. The problem occurs constantly. The problem has been unchanged. The heartburn duration is less than a minute. The heartburn is located in the substernum. The heartburn is of moderate intensity. The heartburn does not wake him from sleep. The heartburn does not limit his activity. The heartburn doesn't change with position. The symptoms are aggravated by certain foods (Grape juice). Pertinent negatives include no anemia or fatigue. Risk factors include obesity. Treatments tried: Mylanta. The treatment provided mild relief.  ? ? ?Review of Systems  ?Constitutional: Negative.  Negative for fatigue.  ?HENT: Negative.  Negative for sore throat.   ?Respiratory:  Negative for cough.   ?Cardiovascular:  Negative for chest pain.  ?Gastrointestinal:  Positive for abdominal pain, heartburn and nausea.  ?Skin: Negative.   ?All other systems reviewed and are negative. ? ? ?   ?Objective:  ?  ?BP 111/71   Pulse 77   Temp 98.8 ?F (37.1 ?C)   Ht 5\' 10"  (1.778 m)   Wt 232 lb (105.2 kg)   SpO2 96%   BMI 33.29 kg/m?  ? ? ?Physical Exam ?Vitals and nursing note reviewed.  ?Constitutional:   ?   Appearance: He is well-developed. He is obese.  ?HENT:  ?   Head: Normocephalic.  ?   Mouth/Throat:  ?   Mouth: Mucous membranes are moist.  ?Cardiovascular:  ?   Rate and Rhythm: Normal rate and regular rhythm.  ?   Heart sounds: Normal heart sounds.  ?Pulmonary:  ?   Effort: Pulmonary effort is normal.  ?   Breath sounds: Normal  breath sounds.  ?Abdominal:  ?   General: Bowel sounds are normal.  ?   Palpations: Abdomen is soft.  ?   Tenderness: There is abdominal tenderness in the epigastric area. There is no right CVA tenderness or left CVA tenderness.  ?Skin: ?   General: Skin is warm.  ?   Findings: No rash.  ?Neurological:  ?   General: No focal deficit present.  ?   Mental Status: He is alert and oriented to person, place, and time.  ? ? ?No results found for any visits on 02/09/22. ? ? ?   ?Assessment & Plan:  ?-Take medication as prescribed ?-Reduce acidic /spicy foods and juices ?-Sit up while eating and sit up at least 45 to 90 degrees. ?-Eat meals 3 to 4 hours before laying down ?-Omeprazole 20 mg tablet by mouth daily. ?-Zofran 4 mg tablet by mouth as needed for nausea ?-Follow-up for worsening hours of symptoms. ?Problem List Items Addressed This Visit   ?None ?Visit Diagnoses   ? ? GERD without esophagitis    -  Primary  ? Relevant Medications  ? omeprazole (PRILOSEC OTC) 20 MG tablet  ? ondansetron (ZOFRAN) 4 MG tablet  ? Nausea      ? Relevant Medications  ? ondansetron (  ZOFRAN) 4 MG tablet  ? ?  ? ? ?Meds ordered this encounter  ?Medications  ? omeprazole (PRILOSEC OTC) 20 MG tablet  ?  Sig: Take 1 tablet (20 mg total) by mouth daily.  ?  Dispense:  60 tablet  ?  Refill:  1  ?  Order Specific Question:   Supervising Provider  ?  AnswerClaretta Fraise [id number]  ? ondansetron (ZOFRAN) 4 MG tablet  ?  Sig: Take 1 tablet (4 mg total) by mouth every 8 (eight) hours as needed for nausea or vomiting.  ?  Dispense:  20 tablet  ?  Refill:  0  ?  Order Specific Question:   Supervising Provider  ?  AnswerClaretta Fraise [id number]  ? ? ?Return if symptoms worsen or fail to improve. ? ?Ivy Lynn, NP ? ? ?

## 2022-02-09 NOTE — Patient Instructions (Signed)
Gastroesophageal Reflux Disease, Adult  Gastroesophageal reflux (GER) happens when acid from the stomach flows up into the tube that connects the mouth and the stomach (esophagus). Normally, food travels down the esophagus and stays in the stomach to be digested. With GER, food and stomach acid sometimes move back up into the esophagus. You may have a disease called gastroesophageal reflux disease (GERD) if the reflux: Happens often. Causes frequent or very bad symptoms. Causes problems such as damage to the esophagus. When this happens, the esophagus becomes sore and swollen. Over time, GERD can make small holes (ulcers) in the lining of the esophagus. What are the causes? This condition is caused by a problem with the muscle between the esophagus and the stomach. When this muscle is weak or not normal, it does not close properly to keep food and acid from coming back up from the stomach. The muscle can be weak because of: Tobacco use. Pregnancy. Having a certain type of hernia (hiatal hernia). Alcohol use. Certain foods and drinks, such as coffee, chocolate, onions, and peppermint. What increases the risk? Being overweight. Having a disease that affects your connective tissue. Taking NSAIDs, such a ibuprofen. What are the signs or symptoms? Heartburn. Difficult or painful swallowing. The feeling of having a lump in the throat. A bitter taste in the mouth. Bad breath. Having a lot of saliva. Having an upset or bloated stomach. Burping. Chest pain. Different conditions can cause chest pain. Make sure you see your doctor if you have chest pain. Shortness of breath or wheezing. A long-term cough or a cough at night. Wearing away of the surface of teeth (tooth enamel). Weight loss. How is this treated? Making changes to your diet. Taking medicine. Having surgery. Treatment will depend on how bad your symptoms are. Follow these instructions at home: Eating and drinking  Follow a  diet as told by your doctor. You may need to avoid foods and drinks such as: Coffee and tea, with or without caffeine. Drinks that contain alcohol. Energy drinks and sports drinks. Bubbly (carbonated) drinks or sodas. Chocolate and cocoa. Peppermint and mint flavorings. Garlic and onions. Horseradish. Spicy and acidic foods. These include peppers, chili powder, curry powder, vinegar, hot sauces, and BBQ sauce. Citrus fruit juices and citrus fruits, such as oranges, lemons, and limes. Tomato-based foods. These include red sauce, chili, salsa, and pizza with red sauce. Fried and fatty foods. These include donuts, french fries, potato chips, and high-fat dressings. High-fat meats. These include hot dogs, rib eye steak, sausage, ham, and bacon. High-fat dairy items, such as whole milk, butter, and cream cheese. Eat small meals often. Avoid eating large meals. Avoid drinking large amounts of liquid with your meals. Avoid eating meals during the 2-3 hours before bedtime. Avoid lying down right after you eat. Do not exercise right after you eat. Lifestyle  Do not smoke or use any products that contain nicotine or tobacco. If you need help quitting, ask your doctor. Try to lower your stress. If you need help doing this, ask your doctor. If you are overweight, lose an amount of weight that is healthy for you. Ask your doctor about a safe weight loss goal. General instructions Pay attention to any changes in your symptoms. Take over-the-counter and prescription medicines only as told by your doctor. Do not take aspirin, ibuprofen, or other NSAIDs unless your doctor says it is okay. Wear loose clothes. Do not wear anything tight around your waist. Raise (elevate) the head of your bed about   6 inches (15 cm). You may need to use a wedge to do this. Avoid bending over if this makes your symptoms worse. Keep all follow-up visits. Contact a doctor if: You have new symptoms. You lose weight and you  do not know why. You have trouble swallowing or it hurts to swallow. You have wheezing or a cough that keeps happening. You have a hoarse voice. Your symptoms do not get better with treatment. Get help right away if: You have sudden pain in your arms, neck, jaw, teeth, or back. You suddenly feel sweaty, dizzy, or light-headed. You have chest pain or shortness of breath. You vomit and the vomit is green, yellow, or black, or it looks like blood or coffee grounds. You faint. Your poop (stool) is red, bloody, or black. You cannot swallow, drink, or eat. These symptoms may represent a serious problem that is an emergency. Do not wait to see if the symptoms will go away. Get medical help right away. Call your local emergency services (911 in the [place] Do not drive yourself to the hospital. Summary If a person has gastroesophageal reflux disease (GERD), food and stomach acid move back up into the esophagus and cause symptoms or problems such as damage to the esophagus. Treatment will depend on how bad your symptoms are. Follow a diet as told by your doctor. Take all medicines only as told by your doctor. This information is not intended to replace advice given to you by your health care provider. Make sure you discuss any questions you have with your health care provider. Document Revised: 04/01/2020 Document Reviewed: 04/01/2020 Elsevier Patient Education  2023 [employer]  

## 2022-02-10 ENCOUNTER — Encounter (HOSPITAL_COMMUNITY)
Admission: RE | Disposition: A | Discharge status: Home / Self Care | Payer: Self-pay | Source: Home / Self Care | Attending: Internal Medicine

## 2022-02-10 ENCOUNTER — Ambulatory Visit (HOSPITAL_BASED_OUTPATIENT_CLINIC_OR_DEPARTMENT_OTHER): Payer: Medicare Other | Admitting: Anesthesiology

## 2022-02-10 ENCOUNTER — Encounter (HOSPITAL_COMMUNITY): Payer: Self-pay

## 2022-02-10 ENCOUNTER — Ambulatory Visit (HOSPITAL_COMMUNITY)
Admission: RE | Admit: 2022-02-10 | Discharge: 2022-02-10 | Disposition: A | Discharge status: Home / Self Care | Payer: Medicare Other | Attending: Internal Medicine | Admitting: Internal Medicine

## 2022-02-10 ENCOUNTER — Other Ambulatory Visit: Payer: Self-pay

## 2022-02-10 ENCOUNTER — Ambulatory Visit (HOSPITAL_COMMUNITY): Payer: Medicare Other | Admitting: Anesthesiology

## 2022-02-10 DIAGNOSIS — Z1211 Encounter for screening for malignant neoplasm of colon: Secondary | ICD-10-CM | POA: Diagnosis not present

## 2022-02-10 DIAGNOSIS — K5730 Diverticulosis of large intestine without perforation or abscess without bleeding: Secondary | ICD-10-CM

## 2022-02-10 DIAGNOSIS — Z87891 Personal history of nicotine dependence: Secondary | ICD-10-CM | POA: Insufficient documentation

## 2022-02-10 DIAGNOSIS — K635 Polyp of colon: Secondary | ICD-10-CM | POA: Diagnosis not present

## 2022-02-10 DIAGNOSIS — K648 Other hemorrhoids: Secondary | ICD-10-CM

## 2022-02-10 DIAGNOSIS — Z139 Encounter for screening, unspecified: Secondary | ICD-10-CM | POA: Diagnosis not present

## 2022-02-10 HISTORY — PX: COLONOSCOPY WITH PROPOFOL: SHX5780

## 2022-02-10 HISTORY — PX: POLYPECTOMY: SHX5525

## 2022-02-10 SURGERY — COLONOSCOPY WITH PROPOFOL
Anesthesia: General

## 2022-02-10 MED ORDER — PROPOFOL 10 MG/ML IV BOLUS
INTRAVENOUS | Status: DC | PRN
  Administered 2022-02-10: 100 mg via INTRAVENOUS
  Administered 2022-02-10: 50 mg via INTRAVENOUS
  Administered 2022-02-10: 30 mg via INTRAVENOUS
  Administered 2022-02-10: 50 mg via INTRAVENOUS

## 2022-02-10 MED ORDER — PHENYLEPHRINE 80 MCG/ML (10ML) SYRINGE FOR IV PUSH (FOR BLOOD PRESSURE SUPPORT)
PREFILLED_SYRINGE | INTRAVENOUS | Status: DC | PRN
  Administered 2022-02-10 (×2): 80 ug via INTRAVENOUS

## 2022-02-10 MED ORDER — LIDOCAINE HCL (CARDIAC) PF 100 MG/5ML IV SOSY
PREFILLED_SYRINGE | INTRAVENOUS | Status: DC | PRN
Start: 2022-02-10 — End: 2022-02-10
  Administered 2022-02-10: 50 mg via INTRAVENOUS

## 2022-02-10 MED ORDER — LACTATED RINGERS IV SOLN: INTRAVENOUS | Status: DC

## 2022-02-10 NOTE — Anesthesia Preprocedure Evaluation (Signed)
Anesthesia Evaluation  ?Patient identified by MRN, date of birth, ID band ?Patient awake ? ? ? ?Reviewed: ?Allergy & Precautions, H&P , NPO status , Patient's Chart, lab work & pertinent test results, reviewed documented beta blocker date and time  ? ?Airway ?Mallampati: II ? ?TM Distance: >3 FB ?Neck ROM: full ? ? ? Dental ?no notable dental hx. ? ?  ?Pulmonary ?neg pulmonary ROS, former smoker,  ?  ?Pulmonary exam normal ?breath sounds clear to auscultation ? ? ? ? ? ? Cardiovascular ?Exercise Tolerance: Good ?negative cardio ROS ? ? ?Rhythm:regular Rate:Normal ? ? ?  ?Neuro/Psych ? Headaches, negative psych ROS  ? GI/Hepatic ?negative GI ROS, Neg liver ROS,   ?Endo/Other  ?negative endocrine ROS ? Renal/GU ?negative Renal ROS  ?negative genitourinary ?  ?Musculoskeletal ? ? Abdominal ?  ?Peds ? Hematology ?negative hematology ROS ?(+)   ?Anesthesia Other Findings ? ? Reproductive/Obstetrics ?negative OB ROS ? ?  ? ? ? ? ? ? ? ? ? ? ? ? ? ?  ?  ? ? ? ? ? ? ? ? ?Anesthesia Physical ?Anesthesia Plan ? ?ASA: 2 ? ?Anesthesia Plan: General  ? ?Post-op Pain Management:   ? ?Induction:  ? ?PONV Risk Score and Plan: Propofol infusion ? ?Airway Management Planned:  ? ?Additional Equipment:  ? ?Intra-op Plan:  ? ?Post-operative Plan:  ? ?Informed Consent: I have reviewed the patients History and Physical, chart, labs and discussed the procedure including the risks, benefits and alternatives for the proposed anesthesia with the patient or authorized representative who has indicated his/her understanding and acceptance.  ? ? ? ?Dental Advisory Given ? ?Plan Discussed with: CRNA ? ?Anesthesia Plan Comments:   ? ? ? ? ? ? ?Anesthesia Quick Evaluation ? ?

## 2022-02-10 NOTE — Transfer of Care (Signed)
Immediate Anesthesia Transfer of Care Note ? ?Patient: Jimmy Hall ? ?Procedure(s) Performed: COLONOSCOPY WITH PROPOFOL ?POLYPECTOMY ? ?Patient Location: Endoscopy Unit ? ?Anesthesia Type:General ? ?Level of Consciousness: awake ? ?Airway & Oxygen Therapy: Patient Spontanous Breathing ? ?Post-op Assessment: Report given to RN and Post -op Vital signs reviewed and stable ? ?Post vital signs: Reviewed and stable ? ?Last Vitals:  ?Vitals Value Taken Time  ?BP    ?Temp    ?Pulse 81   ?Resp    ?SpO2 99%   ? ? ?Last Pain:  ?Vitals:  ? 02/10/22 0851  ?TempSrc: Oral  ?PainSc: 0-No pain  ?   ? ?Patients Stated Pain Goal: 8 (02/10/22 6283) ? ?Complications: No notable events documented. ?

## 2022-02-10 NOTE — H&P (Signed)
Primary Care Physician:  Mechele Claude, MD ?Primary Gastroenterologist:  Dr. Marletta Lor ? ?Pre-Procedure History & Physical: ?HPI:  Jimmy Hall is a 66 y.o. male is here for first ever colonoscopy for colon cancer screening purposes.  Patient denies any family history of colorectal cancer.  No melena or hematochezia.  No abdominal pain or unintentional weight loss.  No change in bowel habits.  Overall feels well from a GI standpoint. ? ?Past Medical History:  ?Diagnosis Date  ? Chronic back pain   ? spondylolisthesis  ? History of bronchitis   ? 14yrs ago and only one time  ? History of gout   ? not on any meds  ? Joint pain   ? knees  ? Numbness   ? left 4th and 5th finger  ? Seasonal allergies   ? takes OTC meds maybe once a week  ? ? ?Past Surgical History:  ?Procedure Laterality Date  ? ANTERIOR CERVICAL DECOMP/DISCECTOMY FUSION N/A 12/29/2013  ? Procedure: ANTERIOR CERVICAL DECOMPRESSION/DISCECTOMY FUSION 3 LEVELS;  Surgeon: Karn Cassis, MD;  Location: [facility];  Service: Neurosurgery;  Laterality: N/A;  Anterior Cervical Three-Four/Four-Five/Five-Six  Decompression/Diskectomy/Fusion  ? BACK SURGERY  02/2014  ? HERNIA REPAIR    ? ? ?Prior to Admission medications   ?Medication Sig Start Date End Date Taking? Authorizing Provider  ?diclofenac (VOLTAREN) 75 MG EC tablet TAKE 1 TABLET BY MOUTH TWICE A DAY ?Patient taking differently: Take 75 mg by mouth 2 (two) times daily as needed for moderate pain. 01/16/22  Yes Mechele Claude, MD  ?fluticasone (FLONASE) 50 MCG/ACT nasal spray Place 2 sprays into both nostrils daily. ?Patient taking differently: Place 2 sprays into both nostrils daily as needed for allergies. 12/02/21  Yes Daryll Drown, NP  ?Garlic 500 MG TABS Take 500 mg by mouth daily.   Yes [provider]  ?Omega-3 Fatty Acids (FISH OIL) 1000 MG CAPS Take 1,000 mg by mouth 2 (two) times a week.   Yes [provider]  ?omeprazole (PRILOSEC OTC) 20 MG tablet Take 1 tablet (20 mg  total) by mouth daily. 02/09/22  Yes Daryll Drown, NP  ?ondansetron (ZOFRAN) 4 MG tablet Take 1 tablet (4 mg total) by mouth every 8 (eight) hours as needed for nausea or vomiting. 02/09/22  Yes Daryll Drown, NP  ?Propylene Glycol (SYSTANE BALANCE) 0.6 % SOLN Place 1 drop into both eyes daily as needed (dry eyes).   Yes [provider]  ?APPLE CIDER VINEGAR PO Take 1 each by mouth daily. Goli Gummies    [provider]  ?Vitamin D, Ergocalciferol, (DRISDOL) 1.25 MG (50000 UNIT) CAPS capsule Take 1 capsule (50,000 Units total) by mouth every 7 (seven) days. 10/29/21   Mechele Claude, MD  ? ? ?Allergies as of 01/08/2022 - Review Complete 01/06/2022  ?Allergen Reaction Noted  ? Cyclobenzaprine Other (See Comments) 04/22/2021  ? Ibuprofen Nausea Only 04/22/2021  ? ? ?Family History  ?Problem Relation Age of Onset  ? Stroke Mother   ? Crohn's disease Son   ? Colon cancer Neg Hx   ? ? ?Social History  ? ?Socioeconomic History  ? Marital status: Widowed  ?  Spouse name: Not on file  ? Number of children: 4  ? Years of education: Not on file  ? Highest education level: 12th grade  ?Occupational History  ? Not on file  ?Tobacco Use  ? Smoking status: Former  ?  Packs/day: 0.50  ?  Years: 6.00  ?  Pack years: 3.00  ?  Types: Cigarettes  ?  Start date: 03/07/1974  ?  Quit date: 09/24/1991  ?  Years since quitting: 30.4  ? Smokeless tobacco: Never  ?Vaping Use  ? Vaping Use: Never used  ?Substance and Sexual Activity  ? Alcohol use: Not Currently  ?  Comment: beer occasionally  ? Drug use: No  ? Sexual activity: Not on file  ?Other Topics Concern  ? Not on file  ?Social History Narrative  ? Recently widowed, 4 children all live locally , 7 grandchildren, 2 grandchildren live with him  ? ?Social Determinants of Health  ? ?Financial Resource Strain: Low Risk   ? Difficulty of Paying Living Expenses: Not hard at all  ?Food Insecurity: Food Insecurity Present  ? Worried About Programme researcher, broadcasting/film/video in the Last Year:  Sometimes true  ? Ran Out of Food in the Last Year: Sometimes true  ?Transportation Needs: Not on file  ?Physical Activity: Not on file  ?Stress: Not on file  ?Social Connections: Moderately Integrated  ? Frequency of Communication with Friends and Family: More than three times a week  ? Frequency of Social Gatherings with Friends and Family: More than three times a week  ? Attends Religious Services: More than 4 times per year  ? Active Member of Clubs or Organizations: Yes  ? Attends Banker Meetings: More than 4 times per year  ? Marital Status: Widowed  ?Intimate Partner Violence: Not on file  ? ? ?Review of Systems: ?See HPI, otherwise negative ROS ? ?Physical Exam: ?Vital signs in last 24 hours: ?Temp:  [97.6 ?F (36.4 ?C)-98.8 ?F (37.1 ?C)] 97.6 ?F (36.4 ?C) (05/09 3419) ?Pulse Rate:  [77-80] 80 (05/09 0851) ?Resp:  [16] 16 (05/09 0851) ?BP: (111-124)/(71-75) 124/75 (05/09 0851) ?SpO2:  [96 %-99 %] 99 % (05/09 0851) ?Weight:  [105.2 kg] 105.2 kg (05/09 0851) ?  ?General:   Alert,  Well-developed, well-nourished, pleasant and cooperative in NAD ?Head:  Normocephalic and atraumatic. ?Eyes:  Sclera clear, no icterus.   Conjunctiva pink. ?Ears:  Normal auditory acuity. ?Nose:  No deformity, discharge,  or lesions. ?Mouth:  No deformity or lesions, dentition normal. ?Neck:  Supple; no masses or thyromegaly. ?Lungs:  Clear throughout to auscultation.   No wheezes, crackles, or rhonchi. No acute distress. ?Heart:  Regular rate and rhythm; no murmurs, clicks, rubs,  or gallops. ?Abdomen:  Soft, nontender and nondistended. No masses, hepatosplenomegaly or hernias noted. Normal bowel sounds, without guarding, and without rebound.   ?Msk:  Symmetrical without gross deformities. Normal posture. ?Extremities:  Without clubbing or edema. ?Neurologic:  Alert and  oriented x4;  grossly normal neurologically. ?Skin:  Intact without significant lesions or rashes. ?Cervical Nodes:  No significant cervical  adenopathy. ?Psych:  Alert and cooperative. Normal mood and affect. ? ?Impression/Plan: ?Jimmy Hall is here for a colonoscopy to be performed for colon cancer screening purposes. ? ?The risks of the procedure including infection, bleed, or perforation as well as benefits, limitations, alternatives and imponderables have been reviewed with the patient. Questions have been answered. All parties agreeable. ? ? ?

## 2022-02-10 NOTE — Discharge Instructions (Addendum)
  Colonoscopy Discharge Instructions  Read the instructions outlined below and refer to this sheet in the next few weeks. These discharge instructions provide you with general information on caring for yourself after you leave the hospital. Your doctor may also give you specific instructions. While your treatment has been planned according to the most current medical practices available, unavoidable complications occasionally occur.   ACTIVITY You may resume your regular activity, but move at a slower pace for the next 24 hours.  Take frequent rest periods for the next 24 hours.  Walking will help get rid of the air and reduce the bloated feeling in your belly (abdomen).  No driving for 24 hours (because of the medicine (anesthesia) used during the test).   Do not sign any important legal documents or operate any machinery for 24 hours (because of the anesthesia used during the test).  NUTRITION Drink plenty of fluids.  You may resume your normal diet as instructed by your doctor.  Begin with a light meal and progress to your normal diet. Heavy or fried foods are harder to digest and may make you feel sick to your stomach (nauseated).  Avoid alcoholic beverages for 24 hours or as instructed.  MEDICATIONS You may resume your normal medications unless your doctor tells you otherwise.  WHAT YOU CAN EXPECT TODAY Some feelings of bloating in the abdomen.  Passage of more gas than usual.  Spotting of blood in your stool or on the toilet paper.  IF YOU HAD POLYPS REMOVED DURING THE COLONOSCOPY: No aspirin products for 7 days or as instructed.  No alcohol for 7 days or as instructed.  Eat a soft diet for the next 24 hours.  FINDING OUT THE RESULTS OF YOUR TEST Not all test results are available during your visit. If your test results are not back during the visit, make an appointment with your caregiver to find out the results. Do not assume everything is normal if you have not heard from your  caregiver or the medical facility. It is important for you to follow up on all of your test results.  SEEK IMMEDIATE MEDICAL ATTENTION IF: You have more than a spotting of blood in your stool.  Your belly is swollen (abdominal distention).  You are nauseated or vomiting.  You have a temperature over 101.  You have abdominal pain or discomfort that is severe or gets worse throughout the day.   Your colonoscopy revealed 1 polyp(s) which I removed successfully. Await pathology results, my office will contact you. I recommend repeating colonoscopy in 5-10 years for surveillance purposes depending on pathology results.   You also have diverticulosis and internal hemorrhoids. I would recommend increasing fiber in your diet or adding OTC Benefiber/Metamucil. Be sure to drink at least 4 to 6 glasses of water daily. Follow-up with GI as needed.   I hope you have a great rest of your week!  Charles K. Carver, D.O. Gastroenterology and Hepatology [facility]  

## 2022-02-10 NOTE — Op Note (Signed)
[facility] ?Patient Name: Jimmy Hall ?Procedure Date: 02/10/2022 9:28 AM ?MRN: [id number] ?Date of Birth: 09-May-1956 ?Attending MD: Elon Alas. Abbey Chatters , DO ?CSN: [id number] ?Age: 66 ?Admit Type: Outpatient ?Procedure:                Colonoscopy ?Indications:              Screening for colorectal malignant neoplasm ?Providers:                Elon Alas. Abbey Chatters, DO, Janeece Riggers, RN, Kristine L.  ?                          Risa Grill, Technician ?Referring MD:              ?Medicines:                See the Anesthesia note for documentation of the  ?                          administered medications ?Complications:            No immediate complications. ?Estimated Blood Loss:     Estimated blood loss was minimal. ?Procedure:                Pre-Anesthesia Assessment: ?                          - The anesthesia plan was to use monitored  ?                          anesthesia care (MAC). ?                          After obtaining informed consent, the colonoscope  ?                          was passed under direct vision. Throughout the  ?                          procedure, the patient's blood pressure, pulse, and  ?                          oxygen saturations were monitored continuously. The  ?                          PCF-HQ190L ([id number]) scope was introduced through  ?                          the anus and advanced to the the cecum, identified  ?                          by appendiceal orifice and ileocecal valve. The  ?                          colonoscopy was performed without difficulty. The  ?                          patient tolerated the procedure  well. The quality  ?                          of the bowel preparation was evaluated using the  ?                          BBPS [facility] Bowel Preparation Scale) with scores  ?                          of: Right Colon = 3, Transverse Colon = 3 and Left  ?                          Colon = 3 (entire mucosa seen well with no residual  ?                          staining,  small fragments of stool or opaque  ?                          liquid). The total BBPS score equals 9. ?Scope In: 9:38:30 AM ?Scope Out: 9:52:57 AM ?Scope Withdrawal Time: 0 hours 10 minutes 14 seconds  ?Total Procedure Duration: 0 hours 14 minutes 27 seconds  ?Findings: ?     The perianal and digital rectal examinations were normal. ?     Non-bleeding internal hemorrhoids were found during endoscopy. ?     Multiple small and large-mouthed diverticula were found in the entire  ?     colon. ?     A 3 mm polyp was found in the transverse colon. The polyp was sessile.  ?     The polyp was removed with a cold snare. Resection and retrieval were  ?     complete. ?     The exam was otherwise without abnormality. ?Impression:               - Non-bleeding internal hemorrhoids. ?                          - Diverticulosis in the entire examined colon. ?                          - One 3 mm polyp in the transverse colon, removed  ?                          with a cold snare. Resected and retrieved. ?                          - The examination was otherwise normal. ?Moderate Sedation: ?     Per Anesthesia Care ?Recommendation:           - Patient has a contact number available for  ?                          emergencies. The signs and symptoms of potential  ?                          delayed complications were discussed with the  ?  patient. Return to normal activities tomorrow.  ?                          Written discharge instructions were provided to the  ?                          patient. ?                          - Resume previous diet. ?                          - Continue present medications. ?                          - Await pathology results. ?                          - Repeat colonoscopy in 5-10 years for surveillance. ?                          - Return to GI clinic PRN. ?Procedure Code(s):        --- Professional --- ?                          [phone], Colonoscopy, flexible; with removal of   ?                          tumor(s), polyp(s), or other lesion(s) by snare  ?                          technique ?Diagnosis Code(s):        --- Professional --- ?                          Z12.11, Encounter for screening for malignant  ?                          neoplasm of colon ?                          K64.8, Other hemorrhoids ?                          K63.5, Polyp of colon ?                          K57.30, Diverticulosis of large intestine without  ?                          perforation or abscess without bleeding ?CPT copyright 2019 American Medical Association. All rights reserved. ?The codes documented in this report are preliminary and upon coder review may  ?be revised to meet current compliance requirements. ?Elon Alas. Abbey Chatters, DO ?Elon Alas. [place], DO ?02/10/2022 9:56:01 AM ?This report has been signed electronically. ?Number of Addenda: 0 ?

## 2022-02-10 NOTE — Anesthesia Postprocedure Evaluation (Signed)
Anesthesia Post Note ? ?Patient: Jimmy Hall ? ?Procedure(s) Performed: COLONOSCOPY WITH PROPOFOL ?POLYPECTOMY ? ?Patient location during evaluation: Phase II ?Anesthesia Type: General ?Level of consciousness: awake ?Pain management: pain level controlled ?Vital Signs Assessment: post-procedure vital signs reviewed and stable ?Respiratory status: spontaneous breathing and respiratory function stable ?Cardiovascular status: blood pressure returned to baseline and stable ?Postop Assessment: no headache and no apparent nausea or vomiting ?Anesthetic complications: no ?Comments: Late entry ? ? ?No notable events documented. ? ? ?Last Vitals:  ?Vitals:  ? 02/10/22 0955 02/10/22 0957  ?BP: (!) 87/50 103/68  ?Pulse:    ?Resp: 18   ?Temp: 36.5 ?C   ?SpO2: 99%   ?  ?Last Pain:  ?Vitals:  ? 02/10/22 0955  ?TempSrc: Oral  ?PainSc: 0-No pain  ? ? ?  ?  ?  ?  ?  ?  ? ?Louann Sjogren ? ? ? ? ?

## 2022-02-12 LAB — SURGICAL PATHOLOGY

## 2022-02-17 ENCOUNTER — Encounter (HOSPITAL_COMMUNITY): Payer: Self-pay | Admitting: Internal Medicine

## 2022-03-25 DIAGNOSIS — H40023 Open angle with borderline findings, high risk, bilateral: Secondary | ICD-10-CM | POA: Diagnosis not present

## 2022-03-25 DIAGNOSIS — H25813 Combined forms of age-related cataract, bilateral: Secondary | ICD-10-CM | POA: Diagnosis not present

## 2022-04-27 ENCOUNTER — Ambulatory Visit (INDEPENDENT_AMBULATORY_CARE_PROVIDER_SITE_OTHER): Payer: Medicare Other | Admitting: Family Medicine

## 2022-04-27 ENCOUNTER — Encounter: Payer: Self-pay | Admitting: Family Medicine

## 2022-04-27 ENCOUNTER — Telehealth: Payer: Self-pay | Admitting: *Deleted

## 2022-04-27 VITALS — BP 123/72 | HR 62 | Temp 97.7°F | Ht 70.0 in | Wt 238.2 lb

## 2022-04-27 DIAGNOSIS — M25532 Pain in left wrist: Secondary | ICD-10-CM | POA: Diagnosis not present

## 2022-04-27 DIAGNOSIS — Z23 Encounter for immunization: Secondary | ICD-10-CM | POA: Diagnosis not present

## 2022-04-27 DIAGNOSIS — N529 Male erectile dysfunction, unspecified: Secondary | ICD-10-CM | POA: Diagnosis not present

## 2022-04-27 DIAGNOSIS — K219 Gastro-esophageal reflux disease without esophagitis: Secondary | ICD-10-CM

## 2022-04-27 MED ORDER — SILDENAFIL CITRATE 20 MG PO TABS: ORAL_TABLET | ORAL | 5 refills | Status: DC

## 2022-04-27 MED ORDER — FEXOFENADINE HCL 180 MG PO TABS: 180.0000 mg | ORAL_TABLET | Freq: Every day | ORAL | 11 refills | Status: DC

## 2022-04-27 NOTE — Progress Notes (Signed)
Subjective:  Patient ID: Jimmy Hall, male    DOB: 1956/08/11  Age: 66 y.o. MRN: [id number]  CC: Medical Management of Chronic Issues   HPI Jimmy Hall presents for concern for gout. Throbbing yesterday. Increased diclofenac to BID & feeling better today.   Patient in for follow-up of GERD. Currently asymptomatic taking  PPI daily. There is no chest pain or heartburn. No hematemesis and no melena. No dysphagia or choking. Onset is remote. Progression is stable. Complicating factors, none.     04/27/2022    8:24 AM 04/27/2022    8:15 AM 02/09/2022   12:05 PM  Depression screen PHQ 2/9  Decreased Interest 0 0 0  Down, Depressed, Hopeless 0 0 0  PHQ - 2 Score 0 0 0  Altered sleeping 1    Tired, decreased energy 0    Change in appetite 0    Feeling bad or failure about yourself  2    Trouble concentrating 0    Moving slowly or fidgety/restless 0    Suicidal thoughts 0    PHQ-9 Score 3    Difficult doing work/chores Not difficult at all      History Jimmy Hall has a past medical history of Chronic back pain, History of bronchitis, History of gout, Joint pain, Numbness, and Seasonal allergies.   He has a past surgical history that includes Hernia repair; Anterior cervical decomp/discectomy fusion (N/A, 12/29/2013); Back surgery (02/2014); Colonoscopy with propofol (N/A, 02/10/2022); and polypectomy (02/10/2022).   His family history includes Crohn's disease in his son; Stroke in his mother.He reports that he quit smoking about 30 years ago. His smoking use included cigarettes. He started smoking about 48 years ago. He has a 3.00 pack-year smoking history. He has never used smokeless tobacco. He reports that he does not currently use alcohol. He reports that he does not use drugs.    ROS Review of Systems  Constitutional:  Negative for fever.  Respiratory:  Negative for shortness of breath.   Cardiovascular:  Negative for chest pain.  Endocrine: Negative for polydipsia, polyphagia and  polyuria.  Musculoskeletal:  Positive for arthralgias.  Skin:  Negative for rash.       Itching at right ankle     Objective:  BP 123/72   Pulse 62   Temp 97.7 F (36.5 C)   Ht '5\' 10"'  (1.778 m)   Wt 238 lb 3.2 oz (108 kg)   SpO2 99%   BMI 34.18 kg/m   BP Readings from Last 3 Encounters:  04/27/22 123/72  02/10/22 103/68  02/09/22 111/71    Wt Readings from Last 3 Encounters:  04/27/22 238 lb 3.2 oz (108 kg)  02/10/22 232 lb (105.2 kg)  02/09/22 232 lb (105.2 kg)     Physical Exam    Assessment & Plan:   Jimmy Hall was seen today for medical management of chronic issues.  Diagnoses and all orders for this visit:  GERD without esophagitis  Need for Tdap vaccination -     Tdap vaccine greater than or equal to 7yo IM  Left wrist pain -     Uric acid -     CMP14+EGFR  Vasculogenic erectile dysfunction, unspecified vasculogenic erectile dysfunction type  Other orders -     fexofenadine (ALLEGRA) 180 MG tablet; Take 1 tablet (180 mg total) by mouth daily. For allergy symptoms -     sildenafil (REVATIO) 20 MG tablet; Take 2-5 pills at once, orally, with each sexual encounter  I have discontinued Jimmy Hall's ondansetron. I am also having him start on fexofenadine and sildenafil. Additionally, I am having him maintain his Vitamin D (Ergocalciferol), fluticasone, Fish Oil, Garlic, diclofenac, APPLE CIDER VINEGAR PO, Systane Balance, and omeprazole.  Allergies as of 04/27/2022       Reactions   Flexeril [cyclobenzaprine] Other (See Comments)   Drowsiness   Ibuprofen Nausea Only   Prescription tablet 600 mg        Medication List        Accurate as of April 27, 2022  2:48 PM. If you have any questions, ask your nurse or doctor.          STOP taking these medications    ondansetron 4 MG tablet Commonly known as: Zofran Stopped by: Claretta Fraise, MD       TAKE these medications    APPLE CIDER VINEGAR PO Take 1 each by mouth daily. Goli  Gummies   diclofenac 75 MG EC tablet Commonly known as: VOLTAREN TAKE 1 TABLET BY MOUTH TWICE A DAY What changed:  when to take this reasons to take this   fexofenadine 180 MG tablet Commonly known as: ALLEGRA Take 1 tablet (180 mg total) by mouth daily. For allergy symptoms Started by: Claretta Fraise, MD   Fish Oil 1000 MG Caps Take 1,000 mg by mouth 2 (two) times a week.   fluticasone 50 MCG/ACT nasal spray Commonly known as: FLONASE Place 2 sprays into both nostrils daily. What changed:  when to take this reasons to take this   Garlic 053 MG Tabs Take 500 mg by mouth daily.   omeprazole 20 MG tablet Commonly known as: PriLOSEC OTC Take 1 tablet (20 mg total) by mouth daily.   sildenafil 20 MG tablet Commonly known as: REVATIO Take 2-5 pills at once, orally, with each sexual encounter Started by: Claretta Fraise, MD   Systane Balance 0.6 % Soln Generic drug: Propylene Glycol Place 1 drop into both eyes daily as needed (dry eyes).   Vitamin D (Ergocalciferol) 1.25 MG (50000 UNIT) Caps capsule Commonly known as: DRISDOL Take 1 capsule (50,000 Units total) by mouth every 7 (seven) days.         Follow-up: Return in about 6 months (around 10/28/2022) for Compete physical.  Claretta Fraise, M.D.

## 2022-04-27 NOTE — Telephone Encounter (Signed)
Key: [id number] - PA Case ID: [id number] - Rx #: [id number]  Drug Sildenafil Citrate 20MG  tablets Form [employer] Electronic PA Form (2017 NCPDP) Original Claim Info R6,75 SPECIALTY DRUG: [facility] CALL [phone]/SERVICE NOT APPROPRIATE FOR THISLOCATIONDRUG REQUIRES PRIOR AUTHORIZATION  Sent to plan

## 2022-04-28 ENCOUNTER — Other Ambulatory Visit: Payer: Self-pay | Admitting: Family Medicine

## 2022-04-28 LAB — CMP14+EGFR
ALT: 55 IU/L — ABNORMAL HIGH (ref 0–44)
AST: 30 IU/L (ref 0–40)
Albumin/Globulin Ratio: 1.5 (ref 1.2–2.2)
Albumin: 4 g/dL (ref 3.9–4.9)
Alkaline Phosphatase: 69 IU/L (ref 44–121)
BUN/Creatinine Ratio: 14 (ref 10–24)
BUN: 19 mg/dL (ref 8–27)
Bilirubin Total: 0.4 mg/dL (ref 0.0–1.2)
CO2: 23 mmol/L (ref 20–29)
Calcium: 9.3 mg/dL (ref 8.6–10.2)
Chloride: 102 mmol/L (ref 96–106)
Creatinine, Ser: 1.35 mg/dL — ABNORMAL HIGH (ref 0.76–1.27)
Globulin, Total: 2.7 g/dL (ref 1.5–4.5)
Glucose: 98 mg/dL (ref 70–99)
Potassium: 4.5 mmol/L (ref 3.5–5.2)
Sodium: 138 mmol/L (ref 134–144)
Total Protein: 6.7 g/dL (ref 6.0–8.5)
eGFR: 58 mL/min/{1.73_m2} — ABNORMAL LOW (ref >59)

## 2022-04-28 LAB — URIC ACID: Uric Acid: 7.5 mg/dL (ref 3.8–8.4)

## 2022-04-28 MED ORDER — ALLOPURINOL 100 MG PO TABS: 100.0000 mg | ORAL_TABLET | Freq: Every day | ORAL | 3 refills | Status: DC

## 2022-04-28 NOTE — Telephone Encounter (Signed)
Jacaden Collington Key: [id number] - PA Case ID: [id number] - Rx #: [id number] Need help? Call us at [phone] Outcome Deniedon July 24 Your PA request has been denied. Additional information will be provided in the denial communication. (Message [phone]) Drug Sildenafil Citrate 20MG  tablets Form [employer] Electronic PA Form (2017 NCPDP) Original Claim Info R6,75 SPECIALTY DRUG: [facility]BR CALL [phone]/SERVICE NOT APPROPRIATE FOR THISLOCATIONDRUG REQUIRES PRIOR AUTHORIZATION

## 2022-05-02 ENCOUNTER — Other Ambulatory Visit: Payer: Self-pay | Admitting: Nurse Practitioner

## 2022-05-02 DIAGNOSIS — K219 Gastro-esophageal reflux disease without esophagitis: Secondary | ICD-10-CM

## 2022-07-22 ENCOUNTER — Other Ambulatory Visit: Payer: Self-pay | Admitting: Family Medicine

## 2022-07-22 DIAGNOSIS — M25461 Effusion, right knee: Secondary | ICD-10-CM

## 2022-07-22 DIAGNOSIS — M25561 Pain in right knee: Secondary | ICD-10-CM

## 2022-09-25 DIAGNOSIS — H40023 Open angle with borderline findings, high risk, bilateral: Secondary | ICD-10-CM | POA: Diagnosis not present

## 2022-09-25 DIAGNOSIS — H25813 Combined forms of age-related cataract, bilateral: Secondary | ICD-10-CM | POA: Diagnosis not present

## 2022-10-08 ENCOUNTER — Encounter: Payer: Self-pay | Admitting: Family Medicine

## 2022-10-08 ENCOUNTER — Ambulatory Visit (INDEPENDENT_AMBULATORY_CARE_PROVIDER_SITE_OTHER): Payer: Medicare HMO | Admitting: Family Medicine

## 2022-10-08 VITALS — BP 124/82 | HR 86 | Temp 97.6°F | Ht 70.0 in | Wt 249.8 lb

## 2022-10-08 DIAGNOSIS — J40 Bronchitis, not specified as acute or chronic: Secondary | ICD-10-CM | POA: Diagnosis not present

## 2022-10-08 MED ORDER — BENZONATATE 100 MG PO CAPS: 200.0000 mg | ORAL_CAPSULE | Freq: Three times a day (TID) | ORAL | 0 refills | Status: DC | PRN

## 2022-10-08 MED ORDER — PREDNISONE 20 MG PO TABS: 40.0000 mg | ORAL_TABLET | Freq: Every day | ORAL | 0 refills | Status: AC

## 2022-10-08 MED ORDER — ALBUTEROL SULFATE HFA 108 (90 BASE) MCG/ACT IN AERS: 2.0000 | INHALATION_SPRAY | Freq: Four times a day (QID) | RESPIRATORY_TRACT | 2 refills | Status: DC | PRN

## 2022-10-08 NOTE — Progress Notes (Signed)
Acute Office Visit  Subjective:     Patient ID: Jimmy Hall, male    DOB: [birth year], 67 y.o.   MRN: [id number]  Chief Complaint  Patient presents with   Cough    X 2 weeks also with chest congestion.  OTC mucinex not helping     Cough This is a recurrent problem. Episode onset: 2 weeks ago. The problem has been waxing and waning. The cough is Productive of sputum. Associated symptoms include wheezing. Pertinent negatives include no chest pain, chills, ear congestion, ear pain, fever, headaches, heartburn, hemoptysis, myalgias, nasal congestion, postnasal drip, rash, rhinorrhea, sore throat, shortness of breath, sweats or weight loss. Nothing aggravates the symptoms. He has tried OTC cough suppressant for the symptoms. The treatment provided no relief.   Patient is in today for ongoing cough for the last 2 weeks. No other associated symptoms. Does have some sputum production at times. No fever, chills, chest pain, shortness of breath, weakness, or confusion. Has been taking Mucinex without relief of symptoms.   Review of Systems  Constitutional:  Negative for chills, diaphoresis, fever, malaise/fatigue and weight loss.  HENT:  Negative for congestion, ear discharge, ear pain, hearing loss, nosebleeds, postnasal drip, rhinorrhea, sinus pain, sore throat and tinnitus.   Respiratory:  Positive for cough and wheezing. Negative for hemoptysis, sputum production, shortness of breath and stridor.   Cardiovascular:  Negative for chest pain, palpitations, orthopnea, claudication, leg swelling and PND.  Gastrointestinal:  Negative for abdominal pain, heartburn, nausea and vomiting.  Musculoskeletal:  Negative for myalgias.  Skin:  Negative for rash.  Neurological:  Negative for dizziness and headaches.  All other systems reviewed and are negative.  Medical, social, and family history reviewed. Medications and allergies reviewed.      Objective:    BP 124/82   Pulse 86   Temp 97.6 F (36.4  C) (Temporal)   Ht 5\' 10"  (1.778 m)   Wt 249 lb 12.8 oz (113.3 kg)   SpO2 93%   BMI 35.84 kg/m    Physical Exam Vitals and nursing note reviewed.  Constitutional:      General: He is not in acute distress.    Appearance: Normal appearance. He is well-developed and well-groomed. He is obese. He is not ill-appearing, toxic-appearing or diaphoretic.  HENT:     Head: Normocephalic and atraumatic.     Jaw: There is normal jaw occlusion.     Right Ear: Hearing, tympanic membrane, ear canal and external ear normal.     Left Ear: Hearing, tympanic membrane, ear canal and external ear normal.     Nose: Nose normal.     Mouth/Throat:     Lips: Pink.     Mouth: Mucous membranes are moist.     Pharynx: Oropharynx is clear. Uvula midline. No oropharyngeal exudate or posterior oropharyngeal erythema.  Eyes:     General: Lids are normal.     Extraocular Movements: Extraocular movements intact.     Conjunctiva/sclera: Conjunctivae normal.     Pupils: Pupils are equal, round, and reactive to light.  Neck:     Thyroid: No thyroid mass, thyromegaly or thyroid tenderness.     Vascular: No carotid bruit or JVD.     Trachea: Trachea and phonation normal.  Cardiovascular:     Rate and Rhythm: Normal rate and regular rhythm.     Chest Wall: PMI is not displaced.     Pulses: Normal pulses.     Heart sounds: Normal heart sounds.  No murmur heard.    No friction rub. No gallop.  Pulmonary:     Effort: Pulmonary effort is normal. No respiratory distress.     Breath sounds: Wheezing (minimal, scattered) present.     Comments: Dry cough Abdominal:     General: Bowel sounds are normal. There is no distension or abdominal bruit.     Palpations: Abdomen is soft. There is no hepatomegaly or splenomegaly.     Tenderness: There is no abdominal tenderness. There is no right CVA tenderness or left CVA tenderness.     Hernia: No hernia is present.  Musculoskeletal:        General: Normal range of motion.      Cervical back: Normal range of motion and neck supple.     Right lower leg: No edema.     Left lower leg: No edema.  Lymphadenopathy:     Cervical: No cervical adenopathy.  Skin:    General: Skin is warm and dry.     Capillary Refill: Capillary refill takes less than 2 seconds.     Coloration: Skin is not cyanotic, jaundiced or pale.     Findings: No rash.  Neurological:     General: No focal deficit present.     Mental Status: He is alert and oriented to person, place, and time.     Sensory: Sensation is intact.     Motor: Motor function is intact.     Coordination: Coordination is intact.     Gait: Gait is intact.     Deep Tendon Reflexes: Reflexes are normal and symmetric.  Psychiatric:        Attention and Perception: Attention and perception normal.        Mood and Affect: Mood and affect normal.        Speech: Speech normal.        Behavior: Behavior normal. Behavior is cooperative.        Thought Content: Thought content normal.        Cognition and Memory: Cognition and memory normal.        Judgment: Judgment normal.       Assessment & Plan:   Problem List Items Addressed This Visit   None Visit Diagnoses     Bronchitis    -  Primary   Relevant Medications   albuterol (VENTOLIN HFA) 108 (90 Base) MCG/ACT inhaler   predniSONE (DELTASONE) 20 MG tablet   benzonatate (TESSALON PERLES) 100 MG capsule  No indications of acute bacterial illness. Will treat with above. Symptomatic care discussed in detail. Aware to report new, worsening, or persistent symptoms.      Meds ordered this encounter  Medications   albuterol (VENTOLIN HFA) 108 (90 Base) MCG/ACT inhaler    Sig: Inhale 2 puffs into the lungs every 6 (six) hours as needed for wheezing or shortness of breath.    Dispense:  8 g    Refill:  2    Order Specific Question:   Supervising Provider    Answer:   Jeneen Rinks   predniSONE (DELTASONE) 20 MG tablet    Sig: Take 2 tablets (40 mg total) by  mouth daily with breakfast for 5 days. 2 po daily for 5 days    Dispense:  10 tablet    Refill:  0    Order Specific Question:   Supervising Provider    Answer:   Claretta Fraise [[id number]]   benzonatate (TESSALON PERLES) 100 MG capsule    Sig: Take  2 capsules (200 mg total) by mouth 3 (three) times daily as needed for cough.    Dispense:  20 capsule    Refill:  0    Order Specific Question:   Supervising Provider    Answer:   Claretta Fraise [id number]    Return if symptoms worsen or fail to improve.  The above assessment and management plan was discussed with the patient. The patient verbalized understanding of and has agreed to the management plan. Patient is aware to call the clinic if they develop any new symptoms or if symptoms fail to improve or worsen. Patient is aware when to return to the clinic for a follow-up visit. Patient educated on when it is appropriate to go to the emergency department.   Monia Pouch, FNP-C [facility] [address] [place], [place] [address] [phone]

## 2022-11-02 ENCOUNTER — Encounter: Payer: Self-pay | Admitting: Family Medicine

## 2022-11-02 ENCOUNTER — Ambulatory Visit (INDEPENDENT_AMBULATORY_CARE_PROVIDER_SITE_OTHER): Payer: Medicare HMO | Admitting: Family Medicine

## 2022-11-02 ENCOUNTER — Ambulatory Visit (INDEPENDENT_AMBULATORY_CARE_PROVIDER_SITE_OTHER): Payer: Medicare HMO

## 2022-11-02 VITALS — BP 128/84 | HR 82 | Temp 97.6°F | Ht 70.0 in | Wt 241.2 lb

## 2022-11-02 DIAGNOSIS — M25461 Effusion, right knee: Secondary | ICD-10-CM | POA: Diagnosis not present

## 2022-11-02 DIAGNOSIS — R051 Acute cough: Secondary | ICD-10-CM | POA: Diagnosis not present

## 2022-11-02 DIAGNOSIS — M25561 Pain in right knee: Secondary | ICD-10-CM | POA: Diagnosis not present

## 2022-11-02 DIAGNOSIS — Z136 Encounter for screening for cardiovascular disorders: Secondary | ICD-10-CM | POA: Diagnosis not present

## 2022-11-02 DIAGNOSIS — N401 Enlarged prostate with lower urinary tract symptoms: Secondary | ICD-10-CM

## 2022-11-02 DIAGNOSIS — Z125 Encounter for screening for malignant neoplasm of prostate: Secondary | ICD-10-CM | POA: Diagnosis not present

## 2022-11-02 DIAGNOSIS — K219 Gastro-esophageal reflux disease without esophagitis: Secondary | ICD-10-CM | POA: Diagnosis not present

## 2022-11-02 DIAGNOSIS — M4316 Spondylolisthesis, lumbar region: Secondary | ICD-10-CM

## 2022-11-02 DIAGNOSIS — Z0001 Encounter for general adult medical examination with abnormal findings: Secondary | ICD-10-CM | POA: Diagnosis not present

## 2022-11-02 DIAGNOSIS — R350 Frequency of micturition: Secondary | ICD-10-CM

## 2022-11-02 DIAGNOSIS — Z23 Encounter for immunization: Secondary | ICD-10-CM

## 2022-11-02 DIAGNOSIS — R0600 Dyspnea, unspecified: Secondary | ICD-10-CM | POA: Diagnosis not present

## 2022-11-02 DIAGNOSIS — J449 Chronic obstructive pulmonary disease, unspecified: Secondary | ICD-10-CM | POA: Diagnosis not present

## 2022-11-02 DIAGNOSIS — M4802 Spinal stenosis, cervical region: Secondary | ICD-10-CM

## 2022-11-02 DIAGNOSIS — Z0000 Encounter for general adult medical examination without abnormal findings: Secondary | ICD-10-CM

## 2022-11-02 DIAGNOSIS — R059 Cough, unspecified: Secondary | ICD-10-CM | POA: Diagnosis not present

## 2022-11-02 MED ORDER — MOXIFLOXACIN HCL 400 MG PO TABS: 400.0000 mg | ORAL_TABLET | Freq: Every day | ORAL | 0 refills | Status: DC

## 2022-11-02 MED ORDER — TAMSULOSIN HCL 0.4 MG PO CAPS: 0.8000 mg | ORAL_CAPSULE | Freq: Every day | ORAL | 3 refills | Status: DC

## 2022-11-02 MED ORDER — PREDNISONE 10 MG PO TABS: ORAL_TABLET | ORAL | 0 refills | Status: DC

## 2022-11-02 MED ORDER — OMEPRAZOLE 20 MG PO CPDR: 20.0000 mg | DELAYED_RELEASE_CAPSULE | Freq: Every day | ORAL | 1 refills | Status: DC

## 2022-11-02 MED ORDER — DICLOFENAC SODIUM 75 MG PO TBEC: 75.0000 mg | DELAYED_RELEASE_TABLET | Freq: Two times a day (BID) | ORAL | 3 refills | Status: DC

## 2022-11-02 MED ORDER — VITAMIN D (ERGOCALCIFEROL) 1.25 MG (50000 UNIT) PO CAPS: 50000.0000 [IU] | ORAL_CAPSULE | ORAL | 3 refills | Status: DC

## 2022-11-02 NOTE — Progress Notes (Signed)
Your chest x-ray looked normal. Thanks, WS.

## 2022-11-02 NOTE — Progress Notes (Signed)
Subjective:  Patient ID: Jimmy Hall, male    DOB: Sep 17, 1956  Age: 66 y.o. MRN: [id number]  CC: Annual Exam   HPI Jimmy Hall presents for CPE.  Patient in for follow-up of GERD. Currently asymptomatic taking  PPI daily. There is no chest pain or heartburn. No hematemesis and no melena. No dysphagia or choking. Onset is remote. Progression is stable. Complicating factors, none.  Also takes antiinflammatory for cervical spondylosis and for lumbar spondylolisthesis  Low Vitamin D level before taking scrip supplement. Due for blood level.       11/02/2022    8:13 AM 10/08/2022    8:43 AM 04/27/2022    8:24 AM  Depression screen PHQ 2/9  Decreased Interest 0 0 0  Down, Depressed, Hopeless 0 0 0  PHQ - 2 Score 0 0 0  Altered sleeping  1 1  Tired, decreased energy  0 0  Change in appetite  0 0  Feeling bad or failure about yourself   0 2  Trouble concentrating  0 0  Moving slowly or fidgety/restless  0 0  Suicidal thoughts  0 0  PHQ-9 Score  1 3  Difficult doing work/chores  Not difficult at all Not difficult at all    History Jimmy Hall has a past medical history of Chronic back pain, History of bronchitis, History of gout, Joint pain, Numbness, and Seasonal allergies.   He has a past surgical history that includes Hernia repair; Anterior cervical decomp/discectomy fusion (N/A, 12/29/2013); Back surgery (02/2014); Colonoscopy with propofol (N/A, 02/10/2022); and polypectomy (02/10/2022).   His family history includes Crohn's disease in his son; Stroke in his mother.He reports that he quit smoking about 31 years ago. His smoking use included cigarettes. He started smoking about 48 years ago. He has a 3.00 pack-year smoking history. He has never used smokeless tobacco. He reports that he does not currently use alcohol. He reports that he does not use drugs.    ROS Review of Systems  Constitutional:  Negative for activity change, fatigue and unexpected weight change.  HENT:  Negative for  congestion, ear pain, hearing loss, postnasal drip and trouble swallowing.   Eyes:  Negative for pain and visual disturbance.  Respiratory:  Positive for cough (Ongoing. Had tx with prednisone, benzonatate & mucinex. Bringing up yellow sputum.) and shortness of breath (Acute - with the cough. Relief with inhaler). Negative for chest tightness.   Cardiovascular:  Negative for chest pain, palpitations and leg swelling.  Gastrointestinal:  Negative for abdominal distention, abdominal pain, blood in stool, constipation, diarrhea, nausea and vomiting.  Endocrine: Negative for cold intolerance, heat intolerance and polydipsia.  Genitourinary:  Positive for frequency. Negative for difficulty urinating, dysuria, flank pain and urgency.  Musculoskeletal:  Positive for arthralgias (stiff at times). Negative for joint swelling.  Skin:  Negative for color change, rash and wound.  Neurological:  Negative for dizziness, syncope, speech difficulty, weakness, light-headedness, numbness and headaches.  Hematological:  Does not bruise/bleed easily.  Psychiatric/Behavioral:  Negative for confusion, decreased concentration, dysphoric mood and sleep disturbance. The patient is not nervous/anxious.     Objective:  BP 128/84   Pulse 82   Temp 97.6 F (36.4 C)   Ht 5\' 10"  (1.778 m)   Wt 241 lb 3.2 oz (109.4 kg)   SpO2 97%   BMI 34.61 kg/m   BP Readings from Last 3 Encounters:  11/02/22 128/84  10/08/22 124/82  04/27/22 123/72    Wt Readings from Last 3  Encounters:  11/02/22 241 lb 3.2 oz (109.4 kg)  10/08/22 249 lb 12.8 oz (113.3 kg)  04/27/22 238 lb 3.2 oz (108 kg)     Physical Exam Constitutional:      Appearance: He is well-developed.  HENT:     Head: Normocephalic and atraumatic.  Eyes:     Pupils: Pupils are equal, round, and reactive to light.  Neck:     Thyroid: No thyromegaly.     Trachea: No tracheal deviation.  Cardiovascular:     Rate and Rhythm: Normal rate and regular rhythm.      Heart sounds: Normal heart sounds. No murmur heard.    No friction rub. No gallop.  Pulmonary:     Breath sounds: Normal breath sounds. No wheezing or rales.  Abdominal:     General: Bowel sounds are normal. There is no distension.     Palpations: Abdomen is soft. There is no mass.     Tenderness: There is no abdominal tenderness.     Hernia: There is no hernia in the left inguinal area.  Genitourinary:    Penis: Normal.      Testes: Normal.  Musculoskeletal:        General: Normal range of motion.     Cervical back: Normal range of motion.  Lymphadenopathy:     Cervical: No cervical adenopathy.  Skin:    General: Skin is warm and dry.  Neurological:     Mental Status: He is alert and oriented to person, place, and time.       Assessment & Plan:   Jimmy Hall was seen today for annual exam.  Diagnoses and all orders for this visit:  Well adult exam -     CBC with Differential/Platelet -     CMP14+EGFR -     Lipid panel -     VITAMIN D 25 Hydroxy (Vit-D Deficiency, Fractures) -     Urinalysis -     PSA, total and free  GERD without esophagitis -     omeprazole (PRILOSEC) 20 MG capsule; Take 1 capsule (20 mg total) by mouth daily.  Spondylolisthesis of lumbar region  Cervical stenosis of spinal canal  Need for shingles vaccine  Acute cough -     DG Chest 2 View; Future  Effusion of right knee -     diclofenac (VOLTAREN) 75 MG EC tablet; Take 1 tablet (75 mg total) by mouth 2 (two) times daily.  Right knee pain, unspecified chronicity -     diclofenac (VOLTAREN) 75 MG EC tablet; Take 1 tablet (75 mg total) by mouth 2 (two) times daily.  Benign prostatic hyperplasia with urinary frequency  Other orders -     Vitamin D, Ergocalciferol, (DRISDOL) 1.25 MG (50000 UNIT) CAPS capsule; Take 1 capsule (50,000 Units total) by mouth every 7 (seven) days. -     Zoster Recombinant (Shingrix ) -     tamsulosin (FLOMAX) 0.4 MG CAPS capsule; Take 2 capsules (0.8 mg total) by  mouth at bedtime. For urine flow and prostate -     moxifloxacin (AVELOX) 400 MG tablet; Take 1 tablet (400 mg total) by mouth daily. -     predniSONE (DELTASONE) 10 MG tablet; Take 5 daily for 2 days followed by 4,3,2 and 1 for 2 days each.       I have discontinued Oral L. Busker's benzonatate. I have also changed his omeprazole and diclofenac. Additionally, I am having him start on tamsulosin, moxifloxacin, and predniSONE. Lastly, I am  having him maintain his fluticasone, Fish Oil, Garlic, APPLE CIDER VINEGAR PO, Systane Balance, fexofenadine, sildenafil, allopurinol, albuterol, and Vitamin D (Ergocalciferol).  Allergies as of 11/02/2022       Reactions   Flexeril [cyclobenzaprine] Other (See Comments)   Drowsiness   Ibuprofen Nausea Only   Prescription tablet 600 mg        Medication List        Accurate as of November 02, 2022  8:51 AM. If you have any questions, ask your nurse or doctor.          STOP taking these medications    benzonatate 100 MG capsule Commonly known as: Best boy Stopped by: Claretta Fraise, MD       TAKE these medications    albuterol 108 (90 Base) MCG/ACT inhaler Commonly known as: VENTOLIN HFA Inhale 2 puffs into the lungs every 6 (six) hours as needed for wheezing or shortness of breath.   allopurinol 100 MG tablet Commonly known as: ZYLOPRIM Take 1 tablet (100 mg total) by mouth daily.   APPLE CIDER VINEGAR PO Take 1 each by mouth daily. Goli Gummies   diclofenac 75 MG EC tablet Commonly known as: VOLTAREN Take 1 tablet (75 mg total) by mouth 2 (two) times daily.   fexofenadine 180 MG tablet Commonly known as: ALLEGRA Take 1 tablet (180 mg total) by mouth daily. For allergy symptoms   Fish Oil 1000 MG Caps Take 1,000 mg by mouth 2 (two) times a week.   fluticasone 50 MCG/ACT nasal spray Commonly known as: FLONASE Place 2 sprays into both nostrils daily. What changed:  when to take this reasons to take this    Garlic 371 MG Tabs Take 500 mg by mouth daily.   moxifloxacin 400 MG tablet Commonly known as: AVELOX Take 1 tablet (400 mg total) by mouth daily. Started by: Claretta Fraise, MD   omeprazole 20 MG capsule Commonly known as: PRILOSEC Take 1 capsule (20 mg total) by mouth daily. What changed: how much to take Changed by: Claretta Fraise, MD   predniSONE 10 MG tablet Commonly known as: DELTASONE Take 5 daily for 2 days followed by 4,3,2 and 1 for 2 days each. Started by: Claretta Fraise, MD   sildenafil 20 MG tablet Commonly known as: REVATIO Take 2-5 pills at once, orally, with each sexual encounter   Systane Balance 0.6 % Soln Generic drug: Propylene Glycol Place 1 drop into both eyes daily as needed (dry eyes).   tamsulosin 0.4 MG Caps capsule Commonly known as: FLOMAX Take 2 capsules (0.8 mg total) by mouth at bedtime. For urine flow and prostate Started by: Claretta Fraise, MD   Vitamin D (Ergocalciferol) 1.25 MG (50000 UNIT) Caps capsule Commonly known as: DRISDOL Take 1 capsule (50,000 Units total) by mouth every 7 (seven) days.         Follow-up: Return in about 6 months (around 05/03/2023).  Claretta Fraise, M.D.

## 2022-11-03 LAB — PSA, TOTAL AND FREE
PSA, Free Pct: 17.7 %
PSA, Free: 0.23 ng/mL
Prostate Specific Ag, Serum: 1.3 ng/mL (ref 0.0–4.0)

## 2022-11-03 LAB — URINALYSIS
Bilirubin, UA: NEGATIVE
Glucose, UA: NEGATIVE
Ketones, UA: NEGATIVE
Leukocytes,UA: NEGATIVE
Nitrite, UA: NEGATIVE
Protein,UA: NEGATIVE
Specific Gravity, UA: >1.03 — ABNORMAL HIGH (ref 1.005–1.030)
Urobilinogen, Ur: 1 mg/dL (ref 0.2–1.0)
pH, UA: 6 (ref 5.0–7.5)

## 2022-11-03 LAB — CBC WITH DIFFERENTIAL/PLATELET
Basophils Absolute: 0 10*3/uL (ref 0.0–0.2)
Basos: 1 %
EOS (ABSOLUTE): 0.3 10*3/uL (ref 0.0–0.4)
Eos: 4 %
Hematocrit: 46.5 % (ref 37.5–51.0)
Hemoglobin: 15 g/dL (ref 13.0–17.7)
Immature Grans (Abs): 0 10*3/uL (ref 0.0–0.1)
Immature Granulocytes: 0 %
Lymphocytes Absolute: 1.7 10*3/uL (ref 0.7–3.1)
Lymphs: 27 %
MCH: 27.6 pg (ref 26.6–33.0)
MCHC: 32.3 g/dL (ref 31.5–35.7)
MCV: 86 fL (ref 79–97)
Monocytes Absolute: 0.7 10*3/uL (ref 0.1–0.9)
Monocytes: 11 %
Neutrophils Absolute: 3.6 10*3/uL (ref 1.4–7.0)
Neutrophils: 57 %
Platelets: 269 10*3/uL (ref 150–450)
RBC: 5.43 x10E6/uL (ref 4.14–5.80)
RDW: 12.2 % (ref 11.6–15.4)
WBC: 6.3 10*3/uL (ref 3.4–10.8)

## 2022-11-03 LAB — CMP14+EGFR
ALT: 27 IU/L (ref 0–44)
AST: 23 IU/L (ref 0–40)
Albumin/Globulin Ratio: 1.2 (ref 1.2–2.2)
Albumin: 3.9 g/dL (ref 3.9–4.9)
Alkaline Phosphatase: 66 IU/L (ref 44–121)
BUN/Creatinine Ratio: 12 (ref 10–24)
BUN: 19 mg/dL (ref 8–27)
Bilirubin Total: 0.3 mg/dL (ref 0.0–1.2)
CO2: 20 mmol/L (ref 20–29)
Calcium: 9.5 mg/dL (ref 8.6–10.2)
Chloride: 104 mmol/L (ref 96–106)
Creatinine, Ser: 1.56 mg/dL — ABNORMAL HIGH (ref 0.76–1.27)
Globulin, Total: 3.2 g/dL (ref 1.5–4.5)
Glucose: 101 mg/dL — ABNORMAL HIGH (ref 70–99)
Potassium: 4.7 mmol/L (ref 3.5–5.2)
Sodium: 140 mmol/L (ref 134–144)
Total Protein: 7.1 g/dL (ref 6.0–8.5)
eGFR: 49 mL/min/{1.73_m2} — ABNORMAL LOW (ref >59)

## 2022-11-03 LAB — LIPID PANEL
Chol/HDL Ratio: 5.1 ratio — ABNORMAL HIGH (ref 0.0–5.0)
Cholesterol, Total: 221 mg/dL — ABNORMAL HIGH (ref 100–199)
HDL: 43 mg/dL (ref >39)
LDL Chol Calc (NIH): 163 mg/dL — ABNORMAL HIGH (ref 0–99)
Triglycerides: 86 mg/dL (ref 0–149)
VLDL Cholesterol Cal: 15 mg/dL (ref 5–40)

## 2022-11-03 LAB — VITAMIN D 25 HYDROXY (VIT D DEFICIENCY, FRACTURES): Vit D, 25-Hydroxy: 23.5 ng/mL — ABNORMAL LOW (ref 30.0–100.0)

## 2022-11-04 ENCOUNTER — Other Ambulatory Visit: Payer: Self-pay | Admitting: *Deleted

## 2022-11-04 MED ORDER — VITAMIN D (ERGOCALCIFEROL) 1.25 MG (50000 UNIT) PO CAPS: 50000.0000 [IU] | ORAL_CAPSULE | ORAL | 3 refills | Status: DC

## 2022-11-04 MED ORDER — ROSUVASTATIN CALCIUM 10 MG PO TABS: 10.0000 mg | ORAL_TABLET | Freq: Every day | ORAL | 3 refills | Status: DC

## 2022-11-25 ENCOUNTER — Ambulatory Visit (INDEPENDENT_AMBULATORY_CARE_PROVIDER_SITE_OTHER): Payer: No Typology Code available for payment source

## 2022-11-25 ENCOUNTER — Ambulatory Visit: Payer: No Typology Code available for payment source | Admitting: Family Medicine

## 2022-11-25 ENCOUNTER — Encounter: Payer: Self-pay | Admitting: Family Medicine

## 2022-11-25 VITALS — BP 125/83 | HR 78 | Temp 98.0°F | Ht 70.0 in | Wt 244.0 lb

## 2022-11-25 DIAGNOSIS — R062 Wheezing: Secondary | ICD-10-CM

## 2022-11-25 DIAGNOSIS — R0989 Other specified symptoms and signs involving the circulatory and respiratory systems: Secondary | ICD-10-CM

## 2022-11-25 MED ORDER — PROMETHAZINE-DM 6.25-15 MG/5ML PO SYRP: 5.0000 mL | ORAL_SOLUTION | Freq: Four times a day (QID) | ORAL | 0 refills | Status: DC | PRN

## 2022-11-25 MED ORDER — AMOXICILLIN-POT CLAVULANATE 875-125 MG PO TABS: 1.0000 | ORAL_TABLET | Freq: Two times a day (BID) | ORAL | 0 refills | Status: DC

## 2022-11-25 MED ORDER — BENZONATATE 200 MG PO CAPS: 200.0000 mg | ORAL_CAPSULE | Freq: Two times a day (BID) | ORAL | 1 refills | Status: DC | PRN

## 2022-11-25 MED ORDER — PREDNISONE 20 MG PO TABS: ORAL_TABLET | ORAL | 0 refills | Status: DC

## 2022-11-25 NOTE — Progress Notes (Signed)
BP 125/83   Pulse 78   Temp 98 F (36.7 C)   Ht 5' 10"$  (1.778 m)   Wt 244 lb (110.7 kg)   SpO2 100%   BMI 35.01 kg/m    Subjective:   Patient ID: Jimmy Hall, male    DOB: [birth year], 67 y.o.   MRN: [id number]  HPI: Jimmy Hall is a 67 y.o. male presenting on 11/25/2022 for Cough   HPI Cough and chest congestion Patient is coming in today with cough and chest congestion.  He says is worse at night but does get it through the day.  He feels like he is getting more congestion getting down in his chest.  He was treated about a month ago with an antibiotic and a steroid which he said he finished about a couple weeks ago.  He says it got better initially but then it worsened from and it just does not seem to be clearing.  He says he had about a week where it is improved but now his cough is becoming more productive again.  He denies any shortness of breath but does admit to wheezing.  He is using albuterol inhaler that has been given since his illness and he says it does help some.  Relevant past medical, surgical, family and social history reviewed and updated as indicated. Interim medical history since our last visit reviewed. Allergies and medications reviewed and updated.  Review of Systems  Constitutional:  Negative for chills and fever.  HENT:  Positive for congestion and postnasal drip. Negative for ear discharge, ear pain, rhinorrhea, sinus pressure, sneezing, sore throat and voice change.   Eyes:  Negative for pain, discharge, redness and visual disturbance.  Respiratory:  Positive for cough, chest tightness and wheezing. Negative for shortness of breath.   Cardiovascular:  Negative for chest pain and leg swelling.  Musculoskeletal:  Negative for gait problem.  Skin:  Negative for rash.  All other systems reviewed and are negative.   Per HPI unless specifically indicated above   Allergies as of 11/25/2022       Reactions   Flexeril [cyclobenzaprine] Other (See Comments)    Drowsiness   Ibuprofen Nausea Only   Prescription tablet 600 mg        Medication List        Accurate as of November 25, 2022 12:02 PM. If you have any questions, ask your nurse or doctor.          albuterol 108 (90 Base) MCG/ACT inhaler Commonly known as: VENTOLIN HFA Inhale 2 puffs into the lungs every 6 (six) hours as needed for wheezing or shortness of breath.   allopurinol 100 MG tablet Commonly known as: ZYLOPRIM Take 1 tablet (100 mg total) by mouth daily.   amoxicillin-clavulanate 875-125 MG tablet Commonly known as: AUGMENTIN Take 1 tablet by mouth 2 (two) times daily. Started by: Fransisca Kaufmann Denajah Farias, MD   APPLE CIDER VINEGAR PO Take 1 each by mouth daily. Goli Gummies   benzonatate 200 MG capsule Commonly known as: TESSALON Take 1 capsule (200 mg total) by mouth 2 (two) times daily as needed for cough. Started by: Worthy Rancher, MD   diclofenac 75 MG EC tablet Commonly known as: VOLTAREN Take 1 tablet (75 mg total) by mouth 2 (two) times daily.   fexofenadine 180 MG tablet Commonly known as: ALLEGRA Take 1 tablet (180 mg total) by mouth daily. For allergy symptoms   Fish Oil 1000 MG Caps Take 1,000  mg by mouth 2 (two) times a week.   fluticasone 50 MCG/ACT nasal spray Commonly known as: FLONASE Place 2 sprays into both nostrils daily. What changed:  when to take this reasons to take this   Garlic [id number] MG Tabs Take 500 mg by mouth daily.   moxifloxacin 400 MG tablet Commonly known as: AVELOX Take 1 tablet (400 mg total) by mouth daily.   omeprazole 20 MG capsule Commonly known as: PRILOSEC Take 1 capsule (20 mg total) by mouth daily.   predniSONE 20 MG tablet Commonly known as: DELTASONE Take 3 tabs daily for 1 week, then 2 tabs daily for week 2, then 1 tab daily for week 3. What changed:  medication strength additional instructions Changed by: Fransisca Kaufmann Shandrell Boda, MD   rosuvastatin 10 MG tablet Commonly known as:  Crestor Take 1 tablet (10 mg total) by mouth daily.   sildenafil 20 MG tablet Commonly known as: REVATIO Take 2-5 pills at once, orally, with each sexual encounter   Systane Balance 0.6 % Soln Generic drug: Propylene Glycol Place 1 drop into both eyes daily as needed (dry eyes).   tamsulosin 0.4 MG Caps capsule Commonly known as: FLOMAX Take 2 capsules (0.8 mg total) by mouth at bedtime. For urine flow and prostate   Vitamin D (Ergocalciferol) 1.25 MG (50000 UNIT) Caps capsule Commonly known as: DRISDOL Take 1 capsule (50,000 Units total) by mouth every 7 (seven) days.         Objective:   BP 125/83   Pulse 78   Temp 98 F (36.7 C)   Ht 5' 10"$  (1.778 m)   Wt 244 lb (110.7 kg)   SpO2 100%   BMI 35.01 kg/m   Wt Readings from Last 3 Encounters:  11/25/22 244 lb (110.7 kg)  11/02/22 241 lb 3.2 oz (109.4 kg)  10/08/22 249 lb 12.8 oz (113.3 kg)    Physical Exam Vitals and nursing note reviewed.  Constitutional:      General: He is not in acute distress.    Appearance: He is well-developed. He is not diaphoretic.  Eyes:     General: No scleral icterus.    Conjunctiva/sclera: Conjunctivae normal.  Neck:     Thyroid: No thyromegaly.  Cardiovascular:     Rate and Rhythm: Normal rate and regular rhythm.     Heart sounds: Normal heart sounds. No murmur heard. Pulmonary:     Effort: Pulmonary effort is normal. No respiratory distress.     Breath sounds: Wheezing (End expiratory wheezes bilateral) present.  Musculoskeletal:        General: No swelling. Normal range of motion.     Cervical back: Neck supple.  Lymphadenopathy:     Cervical: No cervical adenopathy.  Skin:    General: Skin is warm and dry.     Findings: No rash.  Neurological:     Mental Status: He is alert and oriented to person, place, and time.     Coordination: Coordination normal.  Psychiatric:        Behavior: Behavior normal.     Chest x-ray: No acute cardiopulmonary abnormalities, await  final read from radiology.  Assessment & Plan:   Problem List Items Addressed This Visit   None Visit Diagnoses     Wheezing    -  Primary   Relevant Medications   predniSONE (DELTASONE) 20 MG tablet   amoxicillin-clavulanate (AUGMENTIN) 875-125 MG tablet   benzonatate (TESSALON) 200 MG capsule   Other Relevant Orders   DG  Chest 2 View   Chest congestion       Relevant Medications   predniSONE (DELTASONE) 20 MG tablet   amoxicillin-clavulanate (AUGMENTIN) 875-125 MG tablet   benzonatate (TESSALON) 200 MG capsule   Other Relevant Orders   DG Chest 2 View       Will treat like possible bronchitis/possible COPD exacerbation although he does not carry the diagnosis and he has a very remote history of smoking.  Will give Augmentin and a longer course of prednisone and some Tessalon Perles.  If he continues to have issues from here then he will likely need to be referred to pulmonology and will also likely get a CT of his chest. Follow up plan: Return if symptoms worsen or fail to improve.  Counseling provided for all of the vaccine components Orders Placed This Encounter  Procedures   DG Chest Weaver, MD [facility] 11/25/2022, 12:02 PM

## 2022-11-25 NOTE — Addendum Note (Signed)
Addended by: Caryl Pina on: 11/25/2022 12:12 PM   Modules accepted: Orders

## 2022-12-13 ENCOUNTER — Other Ambulatory Visit: Payer: Self-pay | Admitting: Family Medicine

## 2022-12-13 DIAGNOSIS — R062 Wheezing: Secondary | ICD-10-CM

## 2022-12-13 DIAGNOSIS — R0989 Other specified symptoms and signs involving the circulatory and respiratory systems: Secondary | ICD-10-CM

## 2023-01-05 ENCOUNTER — Telehealth: Payer: Self-pay | Admitting: Family Medicine

## 2023-01-05 NOTE — Telephone Encounter (Signed)
Contacted Marcellius L Volpe to schedule their annual wellness visit. Appointment made for 01/06/2023.  Thank you,  Colletta [place],  [facility] Direct Dial [phone]

## 2023-01-06 ENCOUNTER — Ambulatory Visit (INDEPENDENT_AMBULATORY_CARE_PROVIDER_SITE_OTHER): Payer: Medicare HMO

## 2023-01-06 VITALS — Ht 70.0 in | Wt 249.0 lb

## 2023-01-06 DIAGNOSIS — Z0000 Encounter for general adult medical examination without abnormal findings: Secondary | ICD-10-CM | POA: Diagnosis not present

## 2023-01-06 NOTE — Patient Instructions (Signed)
Jimmy Hall , Thank you for taking time to come for your Medicare Wellness Visit. I appreciate your ongoing commitment to your health goals. Please review the following plan we discussed and let me know if I can assist you in the future.   These are the goals we discussed:  Goals      Exercise 3x per week (30 min per time)        This is a list of the screening recommended for you and due dates:  Health Maintenance  Topic Date Due   COVID-19 Vaccine (3 - 2023-24 season) 06/05/2022   Hepatitis C Screening: USPSTF Recommendation to screen - Ages 18-79 yo.  04/28/2023*   Flu Shot  05/06/2023   Medicare Annual Wellness Visit  01/06/2024   Colon Cancer Screening  02/11/2032   DTaP/Tdap/Td vaccine (2 - Td or Tdap) 04/27/2032   Pneumonia Vaccine  Completed   Zoster (Shingles) Vaccine  Completed   HPV Vaccine  Aged Out  *Topic was postponed. The date shown is not the original due date.    Advanced directives: Advance directive discussed with you today. I have provided a copy for you to complete at home and have notarized. Once this is complete please bring a copy in to our office so we can scan it into your chart.   Conditions/risks identified: Aim for 30 minutes of exercise or brisk walking, 6-8 glasses of water, and 5 servings of fruits and vegetables each day.   Next appointment: Follow up in one year for your annual wellness visit.   Preventive Care 32 Years and Older, Male  Preventive care refers to lifestyle choices and visits with your health care provider that can promote health and wellness. What does preventive care include? A yearly physical exam. This is also called an annual well check. Dental exams once or twice a year. Routine eye exams. Ask your health care provider how often you should have your eyes checked. Personal lifestyle choices, including: Daily care of your teeth and gums. Regular physical activity. Eating a healthy diet. Avoiding tobacco and drug  use. Limiting alcohol use. Practicing safe sex. Taking low doses of aspirin every day. Taking vitamin and mineral supplements as recommended by your health care provider. What happens during an annual well check? The services and screenings done by your health care provider during your annual well check will depend on your age, overall health, lifestyle risk factors, and family history of disease. Counseling  Your health care provider may ask you questions about your: Alcohol use. Tobacco use. Drug use. Emotional well-being. Home and relationship well-being. Sexual activity. Eating habits. History of falls. Memory and ability to understand (cognition). Work and work Statistician. Screening  You may have the following tests or measurements: Height, weight, and BMI. Blood pressure. Lipid and cholesterol levels. These may be checked every 5 years, or more frequently if you are over 87 years old. Skin check. Lung cancer screening. You may have this screening every year starting at age 67 if you have a 30-pack-year history of smoking and currently smoke or have quit within the past 15 years. Fecal occult blood test (FOBT) of the stool. You may have this test every year starting at age 67. Flexible sigmoidoscopy or colonoscopy. You may have a sigmoidoscopy every 5 years or a colonoscopy every 10 years starting at age 67. Prostate cancer screening. Recommendations will vary depending on your family history and other risks. Hepatitis C blood test. Hepatitis B blood test. Sexually transmitted disease (  STD) testing. Diabetes screening. This is done by checking your blood sugar (glucose) after you have not eaten for a while (fasting). You may have this done every 1-3 years. Abdominal aortic aneurysm (AAA) screening. You may need this if you are a current or former smoker. Osteoporosis. You may be screened starting at age 50 if you are at high risk. Talk with your health care provider about  your test results, treatment options, and if necessary, the need for more tests. Vaccines  Your health care provider may recommend certain vaccines, such as: Influenza vaccine. This is recommended every year. Tetanus, diphtheria, and acellular pertussis (Tdap, Td) vaccine. You may need a Td booster every 10 years. Zoster vaccine. You may need this after age 67. Pneumococcal 13-valent conjugate (PCV13) vaccine. One dose is recommended after age 67. Pneumococcal polysaccharide (PPSV23) vaccine. One dose is recommended after age 67. Talk to your health care provider about which screenings and vaccines you need and how often you need them. This information is not intended to replace advice given to you by your health care provider. Make sure you discuss any questions you have with your health care provider. Document Released: 10/18/2015 Document Revised: 06/10/2016 Document Reviewed: 07/23/2015 Elsevier Interactive Patient Education  2017 [place] Prevention in the Home Falls can cause injuries. They can happen to people of all ages. There are many things you can do to make your home safe and to help prevent falls. What can I do on the outside of my home? Regularly fix the edges of walkways and driveways and fix any cracks. Remove anything that might make you trip as you walk through a door, such as a raised step or threshold. Trim any bushes or trees on the path to your home. Use bright outdoor lighting. Clear any walking paths of anything that might make someone trip, such as rocks or tools. Regularly check to see if handrails are loose or broken. Make sure that both sides of any steps have handrails. Any raised decks and porches should have guardrails on the edges. Have any leaves, snow, or ice cleared regularly. Use sand or salt on walking paths during winter. Clean up any spills in your garage right away. This includes oil or grease spills. What can I do in the bathroom? Use  night lights. Install grab bars by the toilet and in the tub and shower. Do not use towel bars as grab bars. Use non-skid mats or decals in the tub or shower. If you need to sit down in the shower, use a plastic, non-slip stool. Keep the floor dry. Clean up any water that spills on the floor as soon as it happens. Remove soap buildup in the tub or shower regularly. Attach bath mats securely with double-sided non-slip rug tape. Do not have throw rugs and other things on the floor that can make you trip. What can I do in the bedroom? Use night lights. Make sure that you have a light by your bed that is easy to reach. Do not use any sheets or blankets that are too big for your bed. They should not hang down onto the floor. Have a firm chair that has side arms. You can use this for support while you get dressed. Do not have throw rugs and other things on the floor that can make you trip. What can I do in the kitchen? Clean up any spills right away. Avoid walking on wet floors. Keep items that you use a lot  in easy-to-reach places. If you need to reach something above you, use a strong step stool that has a grab bar. Keep electrical cords out of the way. Do not use floor polish or wax that makes floors slippery. If you must use wax, use non-skid floor wax. Do not have throw rugs and other things on the floor that can make you trip. What can I do with my stairs? Do not leave any items on the stairs. Make sure that there are handrails on both sides of the stairs and use them. Fix handrails that are broken or loose. Make sure that handrails are as long as the stairways. Check any carpeting to make sure that it is firmly attached to the stairs. Fix any carpet that is loose or worn. Avoid having throw rugs at the top or bottom of the stairs. If you do have throw rugs, attach them to the floor with carpet tape. Make sure that you have a light switch at the top of the stairs and the bottom of the  stairs. If you do not have them, ask someone to add them for you. What else can I do to help prevent falls? Wear shoes that: Do not have high heels. Have rubber bottoms. Are comfortable and fit you well. Are closed at the toe. Do not wear sandals. If you use a stepladder: Make sure that it is fully opened. Do not climb a closed stepladder. Make sure that both sides of the stepladder are locked into place. Ask someone to hold it for you, if possible. Clearly mark and make sure that you can see: Any grab bars or handrails. First and last steps. Where the edge of each step is. Use tools that help you move around (mobility aids) if they are needed. These include: Canes. Walkers. Scooters. Crutches. Turn on the lights when you go into a dark area. Replace any light bulbs as soon as they burn out. Set up your furniture so you have a clear path. Avoid moving your furniture around. If any of your floors are uneven, fix them. If there are any pets around you, be aware of where they are. Review your medicines with your doctor. Some medicines can make you feel dizzy. This can increase your chance of falling. Ask your doctor what other things that you can do to help prevent falls. This information is not intended to replace advice given to you by your health care provider. Make sure you discuss any questions you have with your health care provider. Document Released: 07/18/2009 Document Revised: 02/27/2016 Document Reviewed: 10/26/2014 Elsevier Interactive Patient Education  2017 [employer].

## 2023-01-06 NOTE — Progress Notes (Signed)
Subjective:   Jimmy Hall is a 67 y.o. male who presents for an Initial Medicare Annual Wellness Visit. I connected with  Jimmy Hall on 01/06/23 by a audio enabled telemedicine application and verified that I am speaking with the correct person using two identifiers.  Patient Location: Home  Provider Location: Home Office  I discussed the limitations of evaluation and management by telemedicine. The patient expressed understanding and agreed to proceed.  Review of Systems     Cardiac Risk Factors include: advanced age (>47men, >7 women);male gender;dyslipidemia;hypertension     Objective:    Today's Vitals   01/06/23 0819  Weight: 249 lb (112.9 kg)  Height: 5\' 10"  (1.778 m)   Body mass index is 35.73 kg/m.     01/06/2023    8:24 AM 02/10/2022    8:49 AM 12/30/2021   12:54 PM 10/28/2021    9:14 AM 04/20/2020    4:54 PM 03/15/2019    2:32 PM 02/21/2017    6:23 PM  Advanced Directives  Does Patient Have a Medical Advance Directive? No No No No No No No  Would patient like information on creating a medical advance directive? No - Patient declined No - Patient declined  No - Patient declined       Current Medications (verified) Outpatient Encounter Medications as of 01/06/2023  Medication Sig   albuterol (VENTOLIN HFA) 108 (90 Base) MCG/ACT inhaler Inhale 2 puffs into the lungs every 6 (six) hours as needed for wheezing or shortness of breath.   allopurinol (ZYLOPRIM) 100 MG tablet Take 1 tablet (100 mg total) by mouth daily.   amoxicillin-clavulanate (AUGMENTIN) 875-125 MG tablet Take 1 tablet by mouth 2 (two) times daily.   APPLE CIDER VINEGAR PO Take 1 each by mouth daily. [place] Gummies   benzonatate (TESSALON) 200 MG capsule Take 1 capsule (200 mg total) by mouth 2 (two) times daily as needed for cough.   diclofenac (VOLTAREN) 75 MG EC tablet Take 1 tablet (75 mg total) by mouth 2 (two) times daily.   fexofenadine (ALLEGRA) 180 MG tablet Take 1 tablet (180 mg total) by mouth  daily. For allergy symptoms   fluticasone (FLONASE) 50 MCG/ACT nasal spray Place 2 sprays into both nostrils daily. (Patient taking differently: Place 2 sprays into both nostrils daily as needed for allergies.)   Garlic [id number] MG TABS Take 500 mg by mouth daily.   moxifloxacin (AVELOX) 400 MG tablet Take 1 tablet (400 mg total) by mouth daily.   Omega-3 Fatty Acids (FISH OIL) 1000 MG CAPS Take 1,000 mg by mouth 2 (two) times a week.   omeprazole (PRILOSEC) 20 MG capsule Take 1 capsule (20 mg total) by mouth daily.   predniSONE (DELTASONE) 20 MG tablet Take 3 tabs daily for 1 week, then 2 tabs daily for week 2, then 1 tab daily for week 3.   promethazine-dextromethorphan (PROMETHAZINE-DM) 6.25-15 MG/5ML syrup Take 5 mLs by mouth 4 (four) times daily as needed for cough.   Propylene Glycol (SYSTANE BALANCE) 0.6 % SOLN Place 1 drop into both eyes daily as needed (dry eyes).   rosuvastatin (CRESTOR) 10 MG tablet Take 1 tablet (10 mg total) by mouth daily.   sildenafil (REVATIO) 20 MG tablet Take 2-5 pills at once, orally, with each sexual encounter   tamsulosin (FLOMAX) 0.4 MG CAPS capsule Take 2 capsules (0.8 mg total) by mouth at bedtime. For urine flow and prostate   Vitamin D, Ergocalciferol, (DRISDOL) 1.25 MG (50000 UNIT) CAPS capsule  Take 1 capsule (50,000 Units total) by mouth every 7 (seven) days.   No facility-administered encounter medications on file as of 01/06/2023.    Allergies (verified) Flexeril [cyclobenzaprine] and Ibuprofen   History: Past Medical History:  Diagnosis Date   Chronic back pain    spondylolisthesis   History of bronchitis    58yrs ago and only one time   History of gout    not on any meds   Joint pain    knees   Numbness    left 4th and 5th finger   Seasonal allergies    takes OTC meds maybe once a week   Past Surgical History:  Procedure Laterality Date   ANTERIOR CERVICAL DECOMP/DISCECTOMY FUSION N/A 12/29/2013   Procedure: ANTERIOR CERVICAL  DECOMPRESSION/DISCECTOMY FUSION 3 LEVELS;  Surgeon: Floyce Stakes, MD;  Location: [facility];  Service: Neurosurgery;  Laterality: N/A;  Anterior Cervical Three-Four/Four-Five/Five-Six  Decompression/Diskectomy/Fusion   BACK SURGERY  02/2014   COLONOSCOPY WITH PROPOFOL N/A 02/10/2022   Procedure: COLONOSCOPY WITH PROPOFOL;  Surgeon: Eloise Harman, DO;  Location: [facility];  Service: Endoscopy;  Laterality: N/A;  10:00 / ASA 2   HERNIA REPAIR     POLYPECTOMY  02/10/2022   Procedure: POLYPECTOMY;  Surgeon: Eloise Harman, DO;  Location: [facility];  Service: Endoscopy;;   Family History  Problem Relation Age of Onset   Stroke Mother    Crohn's disease Son    Colon cancer Neg Hx    Social History   Socioeconomic History   Marital status: Widowed    Spouse name: Not on file   Number of children: 4   Years of education: Not on file   Highest education level: 12th grade  Occupational History   Not on file  Tobacco Use   Smoking status: Former    Packs/day: 0.50    Years: 6.00    Additional pack years: 0.00    Total pack years: 3.00    Types: Cigarettes    Start date: 03/07/1974    Quit date: 09/24/1991    Years since quitting: 31.3   Smokeless tobacco: Never  Vaping Use   Vaping Use: Never used  Substance and Sexual Activity   Alcohol use: Not Currently    Comment: beer occasionally   Drug use: No   Sexual activity: Not on file  Other Topics Concern   Not on file  Social History Narrative   Recently widowed, 4 children all live locally , 7 grandchildren, 2 grandchildren live with him   Social Determinants of Health   Financial Resource Strain: Low Risk  (01/06/2023)   Overall Financial Resource Strain (CARDIA)    Difficulty of Paying Living Expenses: Not hard at all  Food Insecurity: No Food Insecurity (01/06/2023)   Hunger Vital Sign    Worried About Running Out of Food in the Last Year: Never true    [place] in the Last Year: Never true   Transportation Needs: No Transportation Needs (01/06/2023)   PRAPARE - Hydrologist (Medical): No    Lack of Transportation (Non-Medical): No  Physical Activity: Not on file  Stress: No Stress Concern Present (01/06/2023)   [place]    Feeling of Stress : Not at all  Social Connections: Moderately Isolated (01/06/2023)   Social Connection and Isolation Panel [NHANES]    Frequency of Communication with Friends and Family: More than three  times a week    Frequency of Social Gatherings with Friends and Family: More than three times a week    Attends Religious Services: More than 4 times per year    Active Member of [employer] or Organizations: No    Attends Archivist Meetings: Never    Marital Status: Widowed    Tobacco Counseling Counseling given: Not Answered   Clinical Intake:  Pre-visit preparation completed: Yes  Pain : No/denies pain     Nutritional Risks: None Diabetes: No  How often do you need to have someone help you when you read instructions, pamphlets, or other written materials from your doctor or pharmacy?: 1 - Never  Diabetic?no   Interpreter Needed?: No  Information entered by :: Jadene Pierini, LPN   Activities of Daily Living    01/06/2023    8:24 AM  In your present state of health, do you have any difficulty performing the following activities:  Hearing? 0  Vision? 0  Difficulty concentrating or making decisions? 0  Walking or climbing stairs? 0  Dressing or bathing? 0  Doing errands, shopping? 0  Preparing Food and eating ? N  Using the Toilet? N  In the past six months, have you accidently leaked urine? N  Do you have problems with loss of bowel control? N  Managing your Medications? N  Managing your Finances? N  Housekeeping or managing your Housekeeping? N    Patient Care Team: Claretta Fraise, MD as PCP - General (Family  Medicine)  Indicate any recent Medical Services you may have received from other than [facility] providers in the past year (date may be approximate).     Assessment:   This is a routine wellness examination for Harlow.  Hearing/Vision screen Vision Screening - Comments:: Wears rx glasses - up to date with routine eye exams with  Dr.Johnson   Dietary issues and exercise activities discussed: Current Exercise Habits: Home exercise routine, Type of exercise: walking, Time (Minutes): 30, Frequency (Times/Week): 3, Weekly Exercise (Minutes/Week): 90, Intensity: Mild, Exercise limited by: None identified   Goals Addressed             This Visit's Progress    Exercise 3x per week (30 min per time)         Depression Screen    01/06/2023    8:23 AM 11/25/2022   11:14 AM 11/02/2022    8:13 AM 10/08/2022    8:43 AM 04/27/2022    8:24 AM 04/27/2022    8:15 AM 02/09/2022   12:05 PM  PHQ 2/9 Scores  PHQ - 2 Score 0 0 0 0 0 0 0  PHQ- 9 Score 0 1  1 3       Fall Risk    01/06/2023    8:20 AM 11/25/2022   11:14 AM 11/02/2022    8:13 AM 10/08/2022    8:43 AM 04/27/2022    8:23 AM  Fall Risk   Falls in the past year? 0 0 0 0 0  Number falls in past yr: 0    0  Injury with Fall? 0    0  Risk for fall due to : No Fall Risks    History of fall(s)  Follow up Falls prevention discussed    Falls evaluation completed    FALL RISK PREVENTION PERTAINING TO THE HOME:  Any stairs in or around the home? Yes  If so, are there any without handrails? No  Home free of loose throw rugs in  walkways, pet beds, electrical cords, etc? Yes  Adequate lighting in your home to reduce risk of falls? Yes   ASSISTIVE DEVICES UTILIZED TO PREVENT FALLS:  Life alert? No  Use of a cane, walker or w/c? No  Grab bars in the bathroom? No  Shower chair or bench in shower? No  Elevated toilet seat or a handicapped toilet? No          01/06/2023    8:24 AM 10/28/2021    9:17 AM  6CIT Screen  What Year? 0 points 0 points   What month? 0 points 0 points  What time? 0 points 0 points  Count back from 20 0 points 0 points  Months in reverse 0 points 4 points  Repeat phrase 0 points 0 points  Total Score 0 points 4 points    Immunizations Immunization History  Administered Date(s) Administered   Fluad Quad(high Dose 65+) 08/08/2021   Influenza,inj,Quad PF,6+ Mos 08/25/2014, 06/15/2018, 08/16/2019   Influenza-Unspecified 06/15/2018   Moderna Sars-Covid-2 Vaccination 01/04/2020, 02/01/2020   PNEUMOCOCCAL CONJUGATE-20 10/28/2021   Tdap 04/27/2022   Zoster Recombinat (Shingrix) 10/28/2021, 11/02/2022    TDAP status: Up to date  Flu Vaccine status: Up to date  Pneumococcal vaccine status: Up to date  Covid-19 vaccine status: Completed vaccines  Qualifies for Shingles Vaccine? Yes   Zostavax completed No   Shingrix Completed?: No.    Education has been provided regarding the importance of this vaccine. Patient has been advised to call insurance company to determine out of pocket expense if they have not yet received this vaccine. Advised may also receive vaccine at local pharmacy or Health Dept. Verbalized acceptance and understanding.  Screening Tests Health Maintenance  Topic Date Due   COVID-19 Vaccine (3 - 2023-24 season) 06/05/2022   Hepatitis C Screening  04/28/2023 (Originally 03/07/1974)   INFLUENZA VACCINE  05/06/2023   Medicare Annual Wellness (AWV)  01/06/2024   COLONOSCOPY (Pts 45-22yrs Insurance coverage will need to be confirmed)  02/11/2032   DTaP/Tdap/Td (2 - Td or Tdap) 04/27/2032   Pneumonia Vaccine 64+ Years old  Completed   Zoster Vaccines- Shingrix  Completed   HPV VACCINES  Aged Out    Health Maintenance  Health Maintenance Due  Topic Date Due   COVID-19 Vaccine (3 - 2023-24 season) 06/05/2022    Colorectal cancer screening: Type of screening: Colonoscopy. Completed 02/10/2022. Repeat every 10 years  Lung Cancer Screening: (Low Dose CT Chest recommended if Age 15-80  years, 30 pack-year currently smoking OR have quit w/in 15years.) does not qualify.   Lung Cancer Screening Referral: n/a  Additional Screening:  Hepatitis C Screening: does not qualify;   Vision Screening: Recommended annual ophthalmology exams for early detection of glaucoma and other disorders of the eye. Is the patient up to date with their annual eye exam?  Yes  Who is the provider or what is the name of the office in which the patient attends annual eye exams? Dr.Johnson  If pt is not established with a provider, would they like to be referred to a provider to establish care? No .   Dental Screening: Recommended annual dental exams for proper oral hygiene  Community Resource Referral / Chronic Care Management: CRR required this visit?  No   CCM required this visit?  No      Plan:     I have personally reviewed and noted the following in the patient's chart:   Medical and social history Use of alcohol, tobacco or illicit  drugs  Current medications and supplements including opioid prescriptions. Patient is not currently taking opioid prescriptions. Functional ability and status Nutritional status Physical activity Advanced directives List of other physicians Hospitalizations, surgeries, and ER visits in previous 12 months Vitals Screenings to include cognitive, depression, and falls Referrals and appointments  In addition, I have reviewed and discussed with patient certain preventive protocols, quality metrics, and best practice recommendations. A written personalized care plan for preventive services as well as general preventive health recommendations were provided to patient.     Daphane Shepherd, LPN   [id number]   Nurse Notes: none

## 2023-05-03 ENCOUNTER — Ambulatory Visit (INDEPENDENT_AMBULATORY_CARE_PROVIDER_SITE_OTHER): Payer: Medicare HMO | Admitting: Family Medicine

## 2023-05-03 ENCOUNTER — Encounter: Payer: Self-pay | Admitting: Family Medicine

## 2023-05-03 VITALS — BP 109/66 | HR 68 | Temp 97.6°F | Ht 70.0 in | Wt 243.6 lb

## 2023-05-03 DIAGNOSIS — K219 Gastro-esophageal reflux disease without esophagitis: Secondary | ICD-10-CM | POA: Diagnosis not present

## 2023-05-03 DIAGNOSIS — M4316 Spondylolisthesis, lumbar region: Secondary | ICD-10-CM

## 2023-05-03 DIAGNOSIS — M4802 Spinal stenosis, cervical region: Secondary | ICD-10-CM | POA: Diagnosis not present

## 2023-05-03 DIAGNOSIS — M25461 Effusion, right knee: Secondary | ICD-10-CM

## 2023-05-03 DIAGNOSIS — E782 Mixed hyperlipidemia: Secondary | ICD-10-CM

## 2023-05-03 DIAGNOSIS — M25561 Pain in right knee: Secondary | ICD-10-CM

## 2023-05-03 DIAGNOSIS — Z1159 Encounter for screening for other viral diseases: Secondary | ICD-10-CM | POA: Diagnosis not present

## 2023-05-03 DIAGNOSIS — J40 Bronchitis, not specified as acute or chronic: Secondary | ICD-10-CM | POA: Diagnosis not present

## 2023-05-03 LAB — CBC WITH DIFFERENTIAL/PLATELET
Basophils Absolute: 0.1 10*3/uL (ref 0.0–0.2)
Basos: 1 %
EOS (ABSOLUTE): 0.4 10*3/uL (ref 0.0–0.4)
Eos: 5 %
Hematocrit: 46.1 % (ref 37.5–51.0)
Hemoglobin: 14.2 g/dL (ref 13.0–17.7)
Immature Grans (Abs): 0 10*3/uL (ref 0.0–0.1)
Immature Granulocytes: 1 %
Lymphocytes Absolute: 1.6 10*3/uL (ref 0.7–3.1)
Lymphs: 23 %
MCH: 26.9 pg (ref 26.6–33.0)
MCHC: 30.8 g/dL — ABNORMAL LOW (ref 31.5–35.7)
MCV: 87 fL (ref 79–97)
Monocytes Absolute: 0.7 10*3/uL (ref 0.1–0.9)
Monocytes: 10 %
Neutrophils Absolute: 4.2 10*3/uL (ref 1.4–7.0)
Neutrophils: 60 %
Platelets: 246 10*3/uL (ref 150–450)
RBC: 5.28 x10E6/uL (ref 4.14–5.80)
RDW: 12.6 % (ref 11.6–15.4)
WBC: 7 10*3/uL (ref 3.4–10.8)

## 2023-05-03 LAB — CMP14+EGFR
ALT: 22 IU/L (ref 0–44)
AST: 21 IU/L (ref 0–40)
Albumin: 4 g/dL (ref 3.9–4.9)
Alkaline Phosphatase: 67 IU/L (ref 44–121)
BUN/Creatinine Ratio: 14 (ref 10–24)
BUN: 18 mg/dL (ref 8–27)
Bilirubin Total: 0.5 mg/dL (ref 0.0–1.2)
CO2: 22 mmol/L (ref 20–29)
Calcium: 9.7 mg/dL (ref 8.6–10.2)
Chloride: 105 mmol/L (ref 96–106)
Creatinine, Ser: 1.26 mg/dL (ref 0.76–1.27)
Globulin, Total: 3 g/dL (ref 1.5–4.5)
Glucose: 93 mg/dL (ref 70–99)
Potassium: 4.7 mmol/L (ref 3.5–5.2)
Sodium: 140 mmol/L (ref 134–144)
Total Protein: 7 g/dL (ref 6.0–8.5)
eGFR: 63 mL/min/{1.73_m2} (ref >59)

## 2023-05-03 LAB — LIPID PANEL
Chol/HDL Ratio: 4.8 ratio (ref 0.0–5.0)
Cholesterol, Total: 238 mg/dL — ABNORMAL HIGH (ref 100–199)
HDL: 50 mg/dL (ref >39)
LDL Chol Calc (NIH): 173 mg/dL — ABNORMAL HIGH (ref 0–99)
Triglycerides: 88 mg/dL (ref 0–149)
VLDL Cholesterol Cal: 15 mg/dL (ref 5–40)

## 2023-05-03 LAB — HEPATITIS C ANTIBODY

## 2023-05-03 MED ORDER — FEXOFENADINE HCL 180 MG PO TABS: 180.0000 mg | ORAL_TABLET | Freq: Every day | ORAL | 3 refills | Status: DC

## 2023-05-03 MED ORDER — ROSUVASTATIN CALCIUM 10 MG PO TABS: 10.0000 mg | ORAL_TABLET | Freq: Every day | ORAL | 3 refills | Status: DC

## 2023-05-03 MED ORDER — AZITHROMYCIN 250 MG PO TABS: ORAL_TABLET | ORAL | 0 refills | Status: DC

## 2023-05-03 MED ORDER — ALBUTEROL SULFATE HFA 108 (90 BASE) MCG/ACT IN AERS
2.0000 | INHALATION_SPRAY | Freq: Four times a day (QID) | RESPIRATORY_TRACT | 2 refills | Status: DC | PRN
Start: 2023-05-03 — End: 2023-12-17

## 2023-05-03 MED ORDER — ALLOPURINOL 100 MG PO TABS: 100.0000 mg | ORAL_TABLET | Freq: Every day | ORAL | 3 refills | Status: AC

## 2023-05-03 MED ORDER — OMEPRAZOLE 20 MG PO CPDR
20.0000 mg | DELAYED_RELEASE_CAPSULE | Freq: Every day | ORAL | 3 refills | Status: DC
Start: 2023-05-03 — End: 2023-12-17

## 2023-05-03 MED ORDER — TRIAMCINOLONE ACETONIDE 0.1 % EX CREA: 1.0000 | TOPICAL_CREAM | Freq: Three times a day (TID) | CUTANEOUS | 0 refills | Status: DC

## 2023-05-03 MED ORDER — DICLOFENAC SODIUM 75 MG PO TBEC
75.0000 mg | DELAYED_RELEASE_TABLET | Freq: Two times a day (BID) | ORAL | 3 refills | Status: DC
Start: 2023-05-03 — End: 2023-12-17

## 2023-05-03 NOTE — Progress Notes (Signed)
Subjective:  Patient ID: Jimmy Hall, male    DOB: Mar 30, 1956  Age: 67 y.o. MRN: [id number]  CC: Medical Management of Chronic Issues, Wheezing, and Chest Congestion   HPI Refoel L Buss presents for recheck of spondylosis. Neck hurts a lot. No side effects of diclofenac. Thought hw was supposed to take it once a day.    Patient in for follow-up of GERD. Currently asymptomatic taking  PPI daily. There is no chest pain or heartburn. No hematemesis and no melena. No dysphagia or choking. Onset is remote. Progression is stable. Complicating factors, none.   in for follow-up of elevated cholesterol. Doing well without complaints but DCed medication. Denies side effects of statin including myalgia and arthralgia and nausea. Currently no chest pain, shortness of breath or other cardiovascular related symptoms noted.  Treated for E.D. with sildenafil.   BPH tx with tamsulosin. Pt. Dced. No sx currently without it.  Neck gets stiff at night. Tight. Awakens with HA 3 times in last 3 weeks.        05/03/2023    9:25 AM 05/03/2023    9:13 AM 01/06/2023    8:23 AM  Depression screen PHQ 2/9  Decreased Interest 0 0 0  Down, Depressed, Hopeless 0 0 0  PHQ - 2 Score 0 0 0  Altered sleeping 0  0  Tired, decreased energy 0  0  Change in appetite 0  0  Feeling bad or failure about yourself  0  0  Trouble concentrating 1  0  Moving slowly or fidgety/restless 0  0  Suicidal thoughts 0  0  PHQ-9 Score 1  0  Difficult doing work/chores Not difficult at all  Not difficult at all    History Deron has a past medical history of Chronic back pain, History of bronchitis, History of gout, Joint pain, Numbness, and Seasonal allergies.   He has a past surgical history that includes Hernia repair; Anterior cervical decomp/discectomy fusion (N/A, 12/29/2013); Back surgery (02/2014); Colonoscopy with propofol (N/A, 02/10/2022); and polypectomy (02/10/2022).   His family history includes Crohn's disease in his son;  Stroke in his mother.He reports that he quit smoking about 31 years ago. His smoking use included cigarettes. He started smoking about 49 years ago. He has a 8.8 pack-year smoking history. He has never used smokeless tobacco. He reports that he does not currently use alcohol. He reports that he does not use drugs.    ROS Review of Systems  Constitutional:  Negative for fever.  HENT:  Positive for congestion.   Respiratory:  Positive for cough (yellow phlegm for 2 days.) and wheezing. Negative for shortness of breath.   Cardiovascular:  Negative for chest pain.  Musculoskeletal:  Positive for arthralgias and neck pain.  Skin:  Positive for wound (itching rash at right anterior ankle is macerated due to scratching.). Negative for rash.  Neurological:  Positive for headaches.    Objective:  BP 109/66   Pulse 68   Temp 97.6 F (36.4 C)   Ht 5\' 10"  (1.778 m)   Wt 243 lb 9.6 oz (110.5 kg)   SpO2 96%   BMI 34.95 kg/m   BP Readings from Last 3 Encounters:  05/03/23 109/66  11/25/22 125/83  11/02/22 128/84    Wt Readings from Last 3 Encounters:  05/03/23 243 lb 9.6 oz (110.5 kg)  01/06/23 249 lb (112.9 kg)  11/25/22 244 lb (110.7 kg)     Physical Exam Vitals reviewed.  Constitutional:  Appearance: He is well-developed.  HENT:     Head: Normocephalic and atraumatic.     Right Ear: External ear normal.     Left Ear: External ear normal.     Mouth/Throat:     Pharynx: No oropharyngeal exudate or posterior oropharyngeal erythema.  Eyes:     Pupils: Pupils are equal, round, and reactive to light.  Cardiovascular:     Rate and Rhythm: Normal rate and regular rhythm.     Heart sounds: No murmur heard. Pulmonary:     Effort: No respiratory distress.     Breath sounds: Normal breath sounds.  Musculoskeletal:     Cervical back: Normal range of motion and neck supple.  Neurological:     Mental Status: He is alert and oriented to person, place, and time.        Assessment & Plan:   Darick was seen today for medical management of chronic issues, wheezing and chest congestion.  Diagnoses and all orders for this visit:  Cervical stenosis of spinal canal -     CBC with Differential/Platelet -     CMP14+EGFR  GERD without esophagitis -     CBC with Differential/Platelet -     CMP14+EGFR -     omeprazole (PRILOSEC) 20 MG capsule; Take 1 capsule (20 mg total) by mouth daily.  Bronchitis -     CBC with Differential/Platelet -     CMP14+EGFR -     albuterol (VENTOLIN HFA) 108 (90 Base) MCG/ACT inhaler; Inhale 2 puffs into the lungs every 6 (six) hours as needed for wheezing or shortness of breath.  Spondylolisthesis of lumbar region -     CBC with Differential/Platelet -     CMP14+EGFR  Effusion of right knee -     CBC with Differential/Platelet -     CMP14+EGFR -     diclofenac (VOLTAREN) 75 MG EC tablet; Take 1 tablet (75 mg total) by mouth 2 (two) times daily.  Right knee pain, unspecified chronicity -     CBC with Differential/Platelet -     CMP14+EGFR -     diclofenac (VOLTAREN) 75 MG EC tablet; Take 1 tablet (75 mg total) by mouth 2 (two) times daily.  Need for hepatitis C screening test -     Hepatitis C Antibody  Mixed hyperlipidemia -     Lipid panel  Other orders -     allopurinol (ZYLOPRIM) 100 MG tablet; Take 1 tablet (100 mg total) by mouth daily. -     fexofenadine (ALLEGRA) 180 MG tablet; Take 1 tablet (180 mg total) by mouth daily. For allergy symptoms -     rosuvastatin (CRESTOR) 10 MG tablet; Take 1 tablet (10 mg total) by mouth daily. -     azithromycin (ZITHROMAX Z-PAK) 250 MG tablet; Take two right away Then one a day for the next 4 days. -     triamcinolone cream (KENALOG) 0.1 %; Apply 1 Application topically 3 (three) times daily. Avoid face and genitalia       I have discontinued Tawfiq L. Labella's tamsulosin, moxifloxacin, Vitamin D (Ergocalciferol), predniSONE, amoxicillin-clavulanate, benzonatate,  and promethazine-dextromethorphan. I am also having him start on azithromycin and triamcinolone cream. Additionally, I am having him maintain his fluticasone, Fish Oil, Garlic, APPLE CIDER VINEGAR PO, Systane Balance, sildenafil, allopurinol, fexofenadine, omeprazole, albuterol, rosuvastatin, and diclofenac.  Allergies as of 05/03/2023       Reactions   Flexeril [cyclobenzaprine] Other (See Comments)   Drowsiness   Ibuprofen Nausea Only  Prescription tablet 600 mg        Medication List        Accurate as of May 03, 2023  9:42 AM. If you have any questions, ask your nurse or doctor.          STOP taking these medications    amoxicillin-clavulanate 875-125 MG tablet Commonly known as: AUGMENTIN Stopped by: Raini Tiley   benzonatate 200 MG capsule Commonly known as: TESSALON Stopped by: Janese Radabaugh   moxifloxacin 400 MG tablet Commonly known as: AVELOX Stopped by: Amarii Amy   predniSONE 20 MG tablet Commonly known as: DELTASONE Stopped by: Broadus John Nautika Cressey   promethazine-dextromethorphan 6.25-15 MG/5ML syrup Commonly known as: PROMETHAZINE-DM Stopped by: Jaymir Struble   tamsulosin 0.4 MG Caps capsule Commonly known as: FLOMAX Stopped by: Tequisha Maahs   Vitamin D (Ergocalciferol) 1.25 MG (50000 UNIT) Caps capsule Commonly known as: DRISDOL Stopped by: Callan Norden       TAKE these medications    albuterol 108 (90 Base) MCG/ACT inhaler Commonly known as: VENTOLIN HFA Inhale 2 puffs into the lungs every 6 (six) hours as needed for wheezing or shortness of breath.   allopurinol 100 MG tablet Commonly known as: ZYLOPRIM Take 1 tablet (100 mg total) by mouth daily.   APPLE CIDER VINEGAR PO Take 1 each by mouth daily. [place] Gummies   azithromycin 250 MG tablet Commonly known as: Zithromax Z-Pak Take two right away Then one a day for the next 4 days. Started by: Dana Dorner   diclofenac 75 MG EC tablet Commonly known as: VOLTAREN Take 1  tablet (75 mg total) by mouth 2 (two) times daily.   fexofenadine 180 MG tablet Commonly known as: ALLEGRA Take 1 tablet (180 mg total) by mouth daily. For allergy symptoms   Fish Oil 1000 MG Caps Take 1,000 mg by mouth 2 (two) times a week.   fluticasone 50 MCG/ACT nasal spray Commonly known as: FLONASE Place 2 sprays into both nostrils daily. What changed:  when to take this reasons to take this   Garlic 500 MG Tabs Take 500 mg by mouth daily.   omeprazole 20 MG capsule Commonly known as: PRILOSEC Take 1 capsule (20 mg total) by mouth daily.   rosuvastatin 10 MG tablet Commonly known as: Crestor Take 1 tablet (10 mg total) by mouth daily.   sildenafil 20 MG tablet Commonly known as: REVATIO Take 2-5 pills at once, orally, with each sexual encounter   Systane Balance 0.6 % Soln Generic drug: Propylene Glycol Place 1 drop into both eyes daily as needed (dry eyes).   triamcinolone cream 0.1 % Commonly known as: KENALOG Apply 1 Application topically 3 (three) times daily. Avoid face and genitalia Started by: Broadus John Shemika Robbs         Follow-up: Return in about 6 months (around 11/03/2023).  Mechele Claude, M.D.

## 2023-08-03 ENCOUNTER — Telehealth: Payer: Self-pay | Admitting: Internal Medicine

## 2023-08-03 NOTE — Telephone Encounter (Signed)
Needs new patient approval

## 2023-08-04 NOTE — Telephone Encounter (Signed)
Called patient left voicemail to contact our office. 

## 2023-11-04 ENCOUNTER — Telehealth: Payer: Self-pay | Admitting: Family Medicine

## 2023-11-04 ENCOUNTER — Ambulatory Visit: Payer: Medicare HMO | Admitting: Family Medicine

## 2023-11-04 NOTE — Telephone Encounter (Signed)
Copied from CRM [id number]. Topic: Appointments - Transfer of Care >> Nov 04, 2023  2:18 PM Monisha R wrote: Pt is requesting to transfer FROM: [facility] (Dr. Darlyn Read)  Pt is requesting to transfer TO: [facility] (Dr. Durwin Nora)  Reason for requested transfer: Patient didn't state why, said he would the day of appointment It is the responsibility of the team the patient would like to transfer to (Dr. Durwin Nora) to reach out to the patient if for any reason this transfer is not acceptable.

## 2023-11-15 ENCOUNTER — Telehealth: Payer: Self-pay | Admitting: Family Medicine

## 2023-11-15 NOTE — Telephone Encounter (Signed)
Copied from CRM [id number]. Topic: Clinical - Medication Question >> Nov 15, 2023  3:56 PM Geroge Baseman wrote: Reason for CRM: Patient wants to know if he can have something sent over to the pharmacy for nasal congestion and chest congestion. Tired, weak, sleeping a lot. Please call to advise if this is possible.

## 2023-11-16 NOTE — Telephone Encounter (Signed)
LMTCB to schedule in person appt or video visit

## 2023-11-17 ENCOUNTER — Encounter: Payer: Self-pay | Admitting: Family Medicine

## 2023-11-17 ENCOUNTER — Telehealth (INDEPENDENT_AMBULATORY_CARE_PROVIDER_SITE_OTHER): Payer: No Typology Code available for payment source | Admitting: Family Medicine

## 2023-11-17 DIAGNOSIS — J0100 Acute maxillary sinusitis, unspecified: Secondary | ICD-10-CM | POA: Diagnosis not present

## 2023-11-17 MED ORDER — AMOXICILLIN-POT CLAVULANATE 875-125 MG PO TABS: 1.0000 | ORAL_TABLET | Freq: Two times a day (BID) | ORAL | 0 refills | Status: DC

## 2023-11-17 NOTE — Progress Notes (Signed)
Subjective:    Patient ID: Jimmy Hall, male    DOB: [birth year], 68 y.o.   MRN: [id number]   HPI: Jimmy Hall is a 68 y.o. male presenting for head congestion. Onset 5 days ago. Flu like symptoms. Getting a little better. Coughing up yellow sputum. PND draining down throat.       05/03/2023    9:25 AM 05/03/2023    9:13 AM 01/06/2023    8:23 AM 11/25/2022   11:14 AM 11/02/2022    8:13 AM  Depression screen PHQ 2/9  Decreased Interest 0 0 0 0 0  Down, Depressed, Hopeless 0 0 0 0 0  PHQ - 2 Score 0 0 0 0 0  Altered sleeping 0  0 1   Tired, decreased energy 0  0 0   Change in appetite 0  0 0   Feeling bad or failure about yourself  0  0 0   Trouble concentrating 1  0 0   Moving slowly or fidgety/restless 0  0 0   Suicidal thoughts 0  0 0   PHQ-9 Score 1  0 1   Difficult doing work/chores Not difficult at all  Not difficult at all Not difficult at all      Relevant past medical, surgical, family and social history reviewed and updated as indicated.  Interim medical history since our last visit reviewed. Allergies and medications reviewed and updated.  ROS:  Review of Systems  Constitutional:  Negative for activity change, appetite change, chills and fever.  HENT:  Positive for congestion, postnasal drip and sinus pressure. Negative for ear discharge, ear pain, hearing loss, nosebleeds, rhinorrhea, sneezing and trouble swallowing.   Respiratory:  Positive for cough. Negative for chest tightness and shortness of breath.   Cardiovascular:  Negative for chest pain and palpitations.  Skin:  Negative for rash.     Social History   Tobacco Use  Smoking Status Former   Current packs/day: 0.00   Average packs/day: 0.5 packs/day for 17.5 years (8.8 ttl pk-yrs)   Types: Cigarettes   Start date: 03/07/1974   Quit date: 09/24/1991   Years since quitting: 32.1  Smokeless Tobacco Never       Objective:     Wt Readings from Last 3 Encounters:  05/03/23 243 lb 9.6 oz (110.5 kg)   01/06/23 249 lb (112.9 kg)  11/25/22 244 lb (110.7 kg)     Exam deferred. Video visit performed.   Assessment & Plan:   1. Acute maxillary sinusitis, recurrence not specified     Meds ordered this encounter  Medications   amoxicillin-clavulanate (AUGMENTIN) 875-125 MG tablet    Sig: Take 1 tablet by mouth 2 (two) times daily. Take all of this medication    Dispense:  20 tablet    Refill:  0    No orders of the defined types were placed in this encounter.     Diagnoses and all orders for this visit:  Acute maxillary sinusitis, recurrence not specified  Other orders -     amoxicillin-clavulanate (AUGMENTIN) 875-125 MG tablet; Take 1 tablet by mouth 2 (two) times daily. Take all of this medication    Virtual Visit  Note  I discussed the limitations, risks, security and privacy concerns of performing an evaluation and management service by video and the availability of in person appointments. The patient was identified with two identifiers. Pt.expressed understanding and agreed to proceed. Pt. Is at home. Dr. Darlyn Read is in his office.  Follow Up Instructions:   I discussed the assessment and treatment plan with the patient. The patient was provided an opportunity to ask questions and all were answered. The patient agreed with the plan and demonstrated an understanding of the instructions.   The patient was advised to call back or seek an in-person evaluation if the symptoms worsen or if the condition fails to improve as anticipated.   Total minutes contact time: 12   Follow up plan: Return if symptoms worsen or fail to improve.  Mechele Claude, MD [place] [facility]

## 2023-11-18 ENCOUNTER — Ambulatory Visit: Payer: No Typology Code available for payment source | Admitting: Family Medicine

## 2023-11-25 DIAGNOSIS — H5203 Hypermetropia, bilateral: Secondary | ICD-10-CM | POA: Diagnosis not present

## 2023-12-17 ENCOUNTER — Encounter: Payer: Self-pay | Admitting: Internal Medicine

## 2023-12-17 ENCOUNTER — Ambulatory Visit (INDEPENDENT_AMBULATORY_CARE_PROVIDER_SITE_OTHER): Payer: Self-pay | Admitting: Internal Medicine

## 2023-12-17 VITALS — BP 120/74 | HR 76 | Ht 71.0 in | Wt 245.0 lb

## 2023-12-17 DIAGNOSIS — J3089 Other allergic rhinitis: Secondary | ICD-10-CM | POA: Diagnosis not present

## 2023-12-17 DIAGNOSIS — J4520 Mild intermittent asthma, uncomplicated: Secondary | ICD-10-CM | POA: Diagnosis not present

## 2023-12-17 DIAGNOSIS — M109 Gout, unspecified: Secondary | ICD-10-CM | POA: Insufficient documentation

## 2023-12-17 DIAGNOSIS — I872 Venous insufficiency (chronic) (peripheral): Secondary | ICD-10-CM

## 2023-12-17 DIAGNOSIS — J45909 Unspecified asthma, uncomplicated: Secondary | ICD-10-CM | POA: Insufficient documentation

## 2023-12-17 DIAGNOSIS — E785 Hyperlipidemia, unspecified: Secondary | ICD-10-CM | POA: Diagnosis not present

## 2023-12-17 DIAGNOSIS — J309 Allergic rhinitis, unspecified: Secondary | ICD-10-CM | POA: Insufficient documentation

## 2023-12-17 DIAGNOSIS — Z125 Encounter for screening for malignant neoplasm of prostate: Secondary | ICD-10-CM | POA: Diagnosis not present

## 2023-12-17 DIAGNOSIS — E559 Vitamin D deficiency, unspecified: Secondary | ICD-10-CM | POA: Diagnosis not present

## 2023-12-17 DIAGNOSIS — J40 Bronchitis, not specified as acute or chronic: Secondary | ICD-10-CM | POA: Diagnosis not present

## 2023-12-17 DIAGNOSIS — E66811 Obesity, class 1: Secondary | ICD-10-CM

## 2023-12-17 DIAGNOSIS — K219 Gastro-esophageal reflux disease without esophagitis: Secondary | ICD-10-CM | POA: Diagnosis not present

## 2023-12-17 MED ORDER — ALBUTEROL SULFATE HFA 108 (90 BASE) MCG/ACT IN AERS: 2.0000 | INHALATION_SPRAY | Freq: Four times a day (QID) | RESPIRATORY_TRACT | 2 refills | Status: AC | PRN

## 2023-12-17 MED ORDER — TRIAMCINOLONE ACETONIDE 0.1 % EX CREA
1.0000 | TOPICAL_CREAM | Freq: Three times a day (TID) | CUTANEOUS | 0 refills | Status: DC
Start: 2023-12-17 — End: 2024-06-06

## 2023-12-17 MED ORDER — FLUTICASONE PROPIONATE 50 MCG/ACT NA SUSP
2.0000 | Freq: Every day | NASAL | 6 refills | Status: DC
Start: 2023-12-17 — End: 2024-06-09

## 2023-12-17 NOTE — Assessment & Plan Note (Addendum)
 He is currently prescribed an albuterol inhaler for as needed use.  Refill requested today.  Pulmonary exam unremarkable.  He is asymptomatic currently.

## 2023-12-17 NOTE — Patient Instructions (Signed)
 It was a pleasure to see you today.  Thank you for giving [place] the opportunity to be involved in your care.  Below is a brief recap of your visit and next steps.  We will plan to see you again in 6 months.  Summary You have established care today No medication changes were made. Refills provided Follow up in 6 months for routine care

## 2023-12-17 NOTE — Progress Notes (Signed)
 New Patient Office Visit  Subjective    Patient ID: Jimmy Hall, male    DOB: August 29, 1956  Age: 68 y.o. MRN: [id number]  CC:  Chief Complaint  Patient presents with   Establish Care    HPI Jimmy Hall presents to establish care.  He is a 68 year old male with a previously documented past medical history significant for gout, HLD, asthmatic bronchitis, GERD, and allergic rhinitis.  Previously followed by Dr. Darlyn Read.  Jimmy Hall reports feeling well today.  He endorses persistent nasal congestion but is otherwise asymptomatic and has no acute concerns to discuss aside from desiring to establish care.  He currently works for a [employer].  Denies tobacco, alcohol, and illicit drug use.  Family medical history significant for CVA.  Chronic medical conditions and outstanding preventative care items discussed today are individually addressed in A/P below.   Outpatient Encounter Medications as of 12/17/2023  Medication Sig   allopurinol (ZYLOPRIM) 100 MG tablet Take 1 tablet (100 mg total) by mouth daily.   fexofenadine (ALLEGRA) 180 MG tablet Take 1 tablet (180 mg total) by mouth daily. For allergy symptoms   [DISCONTINUED] albuterol (VENTOLIN HFA) 108 (90 Base) MCG/ACT inhaler Inhale 2 puffs into the lungs every 6 (six) hours as needed for wheezing or shortness of breath.   [DISCONTINUED] diclofenac (VOLTAREN) 75 MG EC tablet Take 1 tablet (75 mg total) by mouth 2 (two) times daily.   [DISCONTINUED] fluticasone (FLONASE) 50 MCG/ACT nasal spray Place 2 sprays into both nostrils daily. (Patient taking differently: Place 2 sprays into both nostrils daily as needed for allergies.)   albuterol (VENTOLIN HFA) 108 (90 Base) MCG/ACT inhaler Inhale 2 puffs into the lungs every 6 (six) hours as needed for wheezing or shortness of breath.   fluticasone (FLONASE) 50 MCG/ACT nasal spray Place 2 sprays into both nostrils daily.   triamcinolone cream (KENALOG) 0.1 % Apply 1 Application topically 3  (three) times daily. Avoid face and genitalia   [DISCONTINUED] amoxicillin-clavulanate (AUGMENTIN) 875-125 MG tablet Take 1 tablet by mouth 2 (two) times daily. Take all of this medication (Patient not taking: Reported on 12/17/2023)   [DISCONTINUED] APPLE CIDER VINEGAR PO Take 1 each by mouth daily. [place]oli Gummies (Patient not taking: Reported on 12/17/2023)   [DISCONTINUED] azithromycin (ZITHROMAX Z-PAK) 250 MG tablet Take two right away Then one a day for the next 4 days. (Patient not taking: Reported on 12/17/2023)   [DISCONTINUED] Garlic 500 MG TABS Take 500 mg by mouth daily. (Patient not taking: Reported on 12/17/2023)   [DISCONTINUED] Omega-3 Fatty Acids (FISH OIL) 1000 MG CAPS Take 1,000 mg by mouth 2 (two) times a week. (Patient not taking: Reported on 12/17/2023)   [DISCONTINUED] omeprazole (PRILOSEC) 20 MG capsule Take 1 capsule (20 mg total) by mouth daily. (Patient not taking: Reported on 12/17/2023)   [DISCONTINUED] Propylene Glycol (SYSTANE BALANCE) 0.6 % SOLN Place 1 drop into both eyes daily as needed (dry eyes). (Patient not taking: Reported on 12/17/2023)   [DISCONTINUED] rosuvastatin (CRESTOR) 10 MG tablet Take 1 tablet (10 mg total) by mouth daily. (Patient not taking: Reported on 12/17/2023)   [DISCONTINUED] sildenafil (REVATIO) 20 MG tablet Take 2-5 pills at once, orally, with each sexual encounter (Patient not taking: Reported on 12/17/2023)   [DISCONTINUED] triamcinolone cream (KENALOG) 0.1 % Apply 1 Application topically 3 (three) times daily. Avoid face and genitalia (Patient not taking: Reported on 12/17/2023)   No facility-administered encounter medications on file as of 12/17/2023.    Past  Medical History:  Diagnosis Date   Chronic back pain    spondylolisthesis   History of bronchitis    11yrs ago and only one time   History of gout    not on any meds   Hyperlipidemia    Joint pain    knees   Numbness    left 4th and 5th finger   Seasonal allergies    takes OTC meds  maybe once a week    Past Surgical History:  Procedure Laterality Date   ANTERIOR CERVICAL DECOMP/DISCECTOMY FUSION N/A 12/29/2013   Procedure: ANTERIOR CERVICAL DECOMPRESSION/DISCECTOMY FUSION 3 LEVELS;  Surgeon: Karn Cassis, MD;  Location: [facility];  Service: Neurosurgery;  Laterality: N/A;  Anterior Cervical Three-Four/Four-Five/Five-Six  Decompression/Diskectomy/Fusion   BACK SURGERY  02/2014   COLONOSCOPY WITH PROPOFOL N/A 02/10/2022   Procedure: COLONOSCOPY WITH PROPOFOL;  Surgeon: Lanelle Bal, DO;  Location: [facility];  Service: Endoscopy;  Laterality: N/A;  10:00 / ASA 2   HERNIA REPAIR     NECK SURGERY     POLYPECTOMY  02/10/2022   Procedure: POLYPECTOMY;  Surgeon: Lanelle Bal, DO;  Location: [facility];  Service: Endoscopy;;    Family History  Problem Relation Age of Onset   Stroke Mother    Crohn's disease Son    Colon cancer Neg Hx     Social History   Socioeconomic History   Marital status: Widowed    Spouse name: Not on file   Number of children: 4   Years of education: Not on file   Highest education level: 12th grade  Occupational History   Not on file  Tobacco Use   Smoking status: Former    Current packs/day: 0.00    Average packs/day: 0.5 packs/day for 17.5 years (8.8 ttl pk-yrs)    Types: Cigarettes    Start date: 03/07/1974    Quit date: 09/24/1991    Years since quitting: 32.2   Smokeless tobacco: Never  Vaping Use   Vaping status: Never Used  Substance and Sexual Activity   Alcohol use: Not Currently    Comment: beer occasionally   Drug use: No   Sexual activity: Not on file  Other Topics Concern   Not on file  Social History Narrative   Recently widowed, 4 children all live locally , 7 grandchildren, 2 grandchildren live with him   Social Drivers of Corporate investment banker Strain: Low Risk  (01/06/2023)   Overall Financial Resource Strain (CARDIA)    Difficulty of Paying Living Expenses: Not hard at all   Food Insecurity: No Food Insecurity (01/06/2023)   Hunger Vital Sign    Worried About Running Out of Food in the Last Year: Never true    Ran Out of Food in the Last Year: Never true  Transportation Needs: No Transportation Needs (01/06/2023)   PRAPARE - Administrator, Civil Service (Medical): No    Lack of Transportation (Non-Medical): No  Physical Activity: Not on file  Stress: No Stress Concern Present (01/06/2023)   [employer] of Occupational Health - Occupational Stress Questionnaire    Feeling of Stress : Not at all  Social Connections: Moderately Isolated (01/06/2023)   Social Connection and Isolation Panel [NHANES]    Frequency of Communication with Friends and Family: More than three times a week    Frequency of Social Gatherings with Friends and Family: More than three times a week    Attends Religious Services: More than  4 times per year    Active Member of Clubs or Organizations: No    Attends Banker Meetings: Never    Marital Status: Widowed  Intimate Partner Violence: Not At Risk (01/06/2023)   Humiliation, Afraid, Rape, and Kick questionnaire    Fear of Current or Ex-Partner: No    Emotionally Abused: No    Physically Abused: No    Sexually Abused: No    Review of Systems  Constitutional:  Negative for chills and fever.  HENT:  Positive for congestion. Negative for sore throat.   Respiratory:  Negative for cough and shortness of breath.   Cardiovascular:  Negative for chest pain, palpitations and leg swelling.  Gastrointestinal:  Negative for abdominal pain, blood in stool, constipation, diarrhea, nausea and vomiting.  Genitourinary:  Negative for dysuria and hematuria.  Musculoskeletal:  Negative for myalgias.  Skin:  Negative for itching and rash.  Neurological:  Negative for dizziness and headaches.  Psychiatric/Behavioral:  Negative for depression and suicidal ideas.    Objective    BP 120/74 (BP Location: Left Arm, Patient  Position: Sitting, Cuff Size: Large)   Pulse 76   Ht 5\' 11"  (1.803 m)   Wt 245 lb (111.1 kg)   SpO2 94%   BMI 34.17 kg/m   Physical Exam Vitals reviewed.  Constitutional:      General: He is not in acute distress.    Appearance: Normal appearance. He is not ill-appearing.  HENT:     Head: Normocephalic and atraumatic.     Right Ear: External ear normal.     Left Ear: External ear normal.     Nose: Nose normal. No congestion or rhinorrhea.     Mouth/Throat:     Mouth: Mucous membranes are moist.     Pharynx: Oropharynx is clear.  Eyes:     General: No scleral icterus.    Extraocular Movements: Extraocular movements intact.     Conjunctiva/sclera: Conjunctivae normal.     Pupils: Pupils are equal, round, and reactive to light.  Cardiovascular:     Rate and Rhythm: Normal rate and regular rhythm.     Pulses: Normal pulses.     Heart sounds: Normal heart sounds. No murmur heard. Pulmonary:     Effort: Pulmonary effort is normal.     Breath sounds: Normal breath sounds. No wheezing, rhonchi or rales.  Abdominal:     General: Abdomen is flat. Bowel sounds are normal. There is no distension.     Palpations: Abdomen is soft.     Tenderness: There is no abdominal tenderness.  Musculoskeletal:        General: No swelling or deformity. Normal range of motion.     Cervical back: Normal range of motion.  Skin:    General: Skin is warm and dry.     Capillary Refill: Capillary refill takes less than 2 seconds.     Findings: Rash (Chronic venous stasis dermatitis on medial aspect of right lower leg) present.  Neurological:     General: No focal deficit present.     Mental Status: He is alert and oriented to person, place, and time.     Motor: No weakness.  Psychiatric:        Mood and Affect: Mood normal.        Behavior: Behavior normal.        Thought Content: Thought content normal.   Last CBC Lab Results  Component Value Date   WBC 7.0 05/03/2023   HGB  14.2 05/03/2023    HCT 46.1 05/03/2023   MCV 87 05/03/2023   MCH 26.9 05/03/2023   RDW 12.6 05/03/2023   PLT 246 05/03/2023   Last metabolic panel Lab Results  Component Value Date   GLUCOSE 93 05/03/2023   NA 140 05/03/2023   K 4.7 05/03/2023   CL 105 05/03/2023   CO2 22 05/03/2023   BUN 18 05/03/2023   CREATININE 1.26 05/03/2023   EGFR 63 05/03/2023   CALCIUM 9.7 05/03/2023   PROT 7.0 05/03/2023   ALBUMIN 4.0 05/03/2023   LABGLOB 3.0 05/03/2023   AGRATIO 1.2 11/02/2022   BILITOT 0.5 05/03/2023   ALKPHOS 67 05/03/2023   AST 21 05/03/2023   ALT 22 05/03/2023   ANIONGAP 10 04/20/2020   Last lipids Lab Results  Component Value Date   CHOL 238 (H) 05/03/2023   HDL 50 05/03/2023   LDLCALC 173 (H) 05/03/2023   TRIG 88 05/03/2023   CHOLHDL 4.8 05/03/2023   Last hemoglobin A1c Lab Results  Component Value Date   HGBA1C 5.5 10/28/2021   Last thyroid functions Lab Results  Component Value Date   TSH 1.150 11/14/2013   Last vitamin D Lab Results  Component Value Date   VD25OH 23.5 (L) 11/02/2022   Assessment & Plan:   Problem List Items Addressed This Visit       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  Triamcinolone cream refilled today.  Preventative measures reviewed.      Asthmatic bronchitis   He is currently prescribed an albuterol inhaler for as needed use.  Refill requested today.  Pulmonary exam unremarkable.  He is asymptomatic currently.      Allergic rhinitis - Primary   He endorses persistent sinus congestion and rhinorrhea.  Currently prescribed fluticasone nasal spray but endorses intermittent use.  He is also prescribed Allegra but has not been taking it recently.  Fluticasone has been refilled.  Recommend taking Allegra daily as previously prescribed.      GERD (gastroesophageal reflux disease)   Symptoms are adequately controlled with as  needed use of omeprazole      Gout   He is currently prescribed allopurinol 100 mg daily.  Denies recent gout flares.  Check uric acid level today.      Hyperlipidemia   Lipid panel last updated in July 2024.  Total cholesterol 238 and LDL 173.  His previous PCP has prescribed rosuvastatin 10 mg daily but the patient is not taking it currently.  He has attempted to focus on dietary changes in an effort to reduce his cholesterol. -Repeat lipid panel ordered today.  I reviewed with Mr. Harewood that if his LDL remains significantly elevated I will also recommend starting statin therapy.  He is in agreement with this plan.      Return in about 6 months (around 06/18/2024).   Billie Lade, MD

## 2023-12-17 NOTE — Assessment & Plan Note (Signed)
 He is currently prescribed allopurinol 100 mg daily.  Denies recent gout flares.  Check uric acid level today.

## 2023-12-17 NOTE — Assessment & Plan Note (Signed)
 Lipid panel last updated in July 2024.  Total cholesterol 238 and LDL 173.  His previous PCP has prescribed rosuvastatin 10 mg daily but the patient is not taking it currently.  He has attempted to focus on dietary changes in an effort to reduce his cholesterol. -Repeat lipid panel ordered today.  I reviewed with Jimmy Hall that if his LDL remains significantly elevated I will also recommend starting statin therapy.  He is in agreement with this plan.

## 2023-12-17 NOTE — Assessment & Plan Note (Signed)
 He endorses persistent sinus congestion and rhinorrhea.  Currently prescribed fluticasone nasal spray but endorses intermittent use.  He is also prescribed Allegra but has not been taking it recently.  Fluticasone has been refilled.  Recommend taking Allegra daily as previously prescribed.

## 2023-12-17 NOTE — Assessment & Plan Note (Signed)
 Symptoms are adequately controlled with as needed use of omeprazole

## 2023-12-17 NOTE — Assessment & Plan Note (Signed)
 There is evidence of chronic venous stasis dermatitis on the medial aspect of the right lower leg.  Triamcinolone cream refilled today.  Preventative measures reviewed.

## 2023-12-18 LAB — CBC WITH DIFFERENTIAL/PLATELET
Basophils Absolute: 0 10*3/uL (ref 0.0–0.2)
Basos: 1 %
EOS (ABSOLUTE): 0.2 10*3/uL (ref 0.0–0.4)
Eos: 3 %
Hematocrit: 45.5 % (ref 37.5–51.0)
Hemoglobin: 14.1 g/dL (ref 13.0–17.7)
Immature Grans (Abs): 0 10*3/uL (ref 0.0–0.1)
Immature Granulocytes: 0 %
Lymphocytes Absolute: 1.6 10*3/uL (ref 0.7–3.1)
Lymphs: 23 %
MCH: 27.6 pg (ref 26.6–33.0)
MCHC: 31 g/dL — ABNORMAL LOW (ref 31.5–35.7)
MCV: 89 fL (ref 79–97)
Monocytes Absolute: 0.8 10*3/uL (ref 0.1–0.9)
Monocytes: 11 %
Neutrophils Absolute: 4.2 10*3/uL (ref 1.4–7.0)
Neutrophils: 62 %
Platelets: 219 10*3/uL (ref 150–450)
RBC: 5.11 x10E6/uL (ref 4.14–5.80)
RDW: 12.2 % (ref 11.6–15.4)
WBC: 6.9 10*3/uL (ref 3.4–10.8)

## 2023-12-18 LAB — CMP14+EGFR
ALT: 18 IU/L (ref 0–44)
AST: 21 IU/L (ref 0–40)
Albumin: 4.1 g/dL (ref 3.9–4.9)
Alkaline Phosphatase: 69 IU/L (ref 44–121)
BUN/Creatinine Ratio: 12 (ref 10–24)
BUN: 16 mg/dL (ref 8–27)
Bilirubin Total: 0.5 mg/dL (ref 0.0–1.2)
CO2: 23 mmol/L (ref 20–29)
Calcium: 9.4 mg/dL (ref 8.6–10.2)
Chloride: 104 mmol/L (ref 96–106)
Creatinine, Ser: 1.33 mg/dL — ABNORMAL HIGH (ref 0.76–1.27)
Globulin, Total: 2.6 g/dL (ref 1.5–4.5)
Glucose: 88 mg/dL (ref 70–99)
Potassium: 4.9 mmol/L (ref 3.5–5.2)
Sodium: 142 mmol/L (ref 134–144)
Total Protein: 6.7 g/dL (ref 6.0–8.5)
eGFR: 59 mL/min/{1.73_m2} — ABNORMAL LOW (ref >59)

## 2023-12-18 LAB — B12 AND FOLATE PANEL
Folate: 8.7 ng/mL (ref >3.0)
Vitamin B-12: 730 pg/mL (ref 232–1245)

## 2023-12-18 LAB — LIPID PANEL
Chol/HDL Ratio: 4.7 ratio (ref 0.0–5.0)
Cholesterol, Total: 217 mg/dL — ABNORMAL HIGH (ref 100–199)
HDL: 46 mg/dL (ref >39)
LDL Chol Calc (NIH): 153 mg/dL — ABNORMAL HIGH (ref 0–99)
Triglycerides: 98 mg/dL (ref 0–149)
VLDL Cholesterol Cal: 18 mg/dL (ref 5–40)

## 2023-12-18 LAB — HEMOGLOBIN A1C
Est. average glucose Bld gHb Est-mCnc: 117 mg/dL
Hgb A1c MFr Bld: 5.7 % — ABNORMAL HIGH (ref 4.8–5.6)

## 2023-12-18 LAB — VITAMIN D 25 HYDROXY (VIT D DEFICIENCY, FRACTURES): Vit D, 25-Hydroxy: 23 ng/mL — ABNORMAL LOW (ref 30.0–100.0)

## 2023-12-18 LAB — URIC ACID: Uric Acid: 8 mg/dL (ref 3.8–8.4)

## 2023-12-18 LAB — TSH+FREE T4
Free T4: 1.14 ng/dL (ref 0.82–1.77)
TSH: 3.18 u[IU]/mL (ref 0.450–4.500)

## 2023-12-18 LAB — PSA: Prostate Specific Ag, Serum: 1.4 ng/mL (ref 0.0–4.0)

## 2023-12-19 ENCOUNTER — Encounter: Payer: Self-pay | Admitting: Internal Medicine

## 2024-01-07 NOTE — Progress Notes (Signed)
 This encounter was created in error - please disregard.

## 2024-05-03 ENCOUNTER — Ambulatory Visit
Admission: EM | Admit: 2024-05-03 | Discharge: 2024-05-03 | Disposition: A | Discharge status: Home / Self Care | Attending: Family Medicine | Admitting: Family Medicine

## 2024-05-03 ENCOUNTER — Ambulatory Visit: Payer: Self-pay | Admitting: *Deleted

## 2024-05-03 DIAGNOSIS — J3089 Other allergic rhinitis: Secondary | ICD-10-CM

## 2024-05-03 DIAGNOSIS — J0100 Acute maxillary sinusitis, unspecified: Secondary | ICD-10-CM

## 2024-05-03 DIAGNOSIS — H579 Unspecified disorder of eye and adnexa: Secondary | ICD-10-CM | POA: Diagnosis not present

## 2024-05-03 DIAGNOSIS — L97911 Non-pressure chronic ulcer of unspecified part of right lower leg limited to breakdown of skin: Secondary | ICD-10-CM

## 2024-05-03 MED ORDER — BACITRACIN 500 UNIT/GM EX OINT
1.0000 | TOPICAL_OINTMENT | Freq: Two times a day (BID) | CUTANEOUS | Status: DC
  Administered 2024-05-03: 1 via TOPICAL

## 2024-05-03 MED ORDER — CHLORHEXIDINE GLUCONATE 4 % EX SOLN
Freq: Every day | CUTANEOUS | 0 refills | Status: DC | PRN
Start: 2024-05-03 — End: 2024-06-06

## 2024-05-03 MED ORDER — AZELASTINE HCL 0.1 % NA SOLN: 1.0000 | Freq: Two times a day (BID) | NASAL | 0 refills | Status: DC

## 2024-05-03 MED ORDER — AMOXICILLIN-POT CLAVULANATE 875-125 MG PO TABS: 1.0000 | ORAL_TABLET | Freq: Two times a day (BID) | ORAL | 0 refills | Status: DC

## 2024-05-03 MED ORDER — MUPIROCIN 2 % EX OINT: 1.0000 | TOPICAL_OINTMENT | Freq: Two times a day (BID) | CUTANEOUS | 0 refills | Status: AC

## 2024-05-03 NOTE — Telephone Encounter (Signed)
 FYI Only or Action Required?: FYI only for provider.  Patient was last seen in primary care on 12/17/2023 by Melvenia Manus BRAVO, MD.  Called Nurse Triage reporting Sinusitis and Dizziness.  Symptoms began several months ago.  Interventions attempted: Rest, hydration, or home remedies.  Symptoms are: gradually worsening.  Triage Disposition: See Physician Within 24 Hours  Patient/caregiver understands and will follow disposition?: No open appointment- UC advised     Reason for Disposition  [1] MODERATE dizziness (e.g., interferes with normal activities) AND [2] has NOT been evaluated by doctor (or NP/PA) for this  (Exception: Dizziness caused by heat exposure, sudden standing, or poor fluid intake.)  Answer Assessment - Initial Assessment Questions 1. DESCRIPTION: Describe your dizziness.     Head hurts- sinus irritation 2. LIGHTHEADED: Do you feel lightheaded? (e.g., somewhat faint, woozy, weak upon standing)     no 3. VERTIGO: Do you feel like either you or the room is spinning or tilting? (i.e., vertigo)     no 4. SEVERITY: How bad is it?  Do you feel like you are going to faint? Can you stand and walk?     Normal function 5. ONSET:  When did the dizziness begin?     1-2 month  8. CAUSE: What do you think is causing the dizziness? (e.g., decreased fluids or food, diarrhea, emotional distress, heat exposure, new medicine, sudden standing, vomiting; unknown)     infection  10. OTHER SYMPTOMS: Do you have any other symptoms? (e.g., fever, chest pain, vomiting, diarrhea, bleeding)       Bad breath, nasal passage congestion- no color, whites of eye- dark  Protocols used: Dizziness - Lightheadedness-A-AH  Copied from CRM [id number]. Topic: Clinical - Red Word Triage >> May 03, 2024  8:13 AM Marissa P wrote: Red Word that prompted transfer to Nurse Triage: Eyes seem to be discolored, seem to be having some infection, sinus congestion and color of eyes brownish.  Breath smells bad. Slight dizziness and would like to get checked out to see what's going on please

## 2024-05-03 NOTE — Telephone Encounter (Signed)
Patient advised to go to Urgent care

## 2024-05-03 NOTE — Discharge Instructions (Signed)
 I have prescribed an antibiotic for your worsening sinus symptoms and a new nasal spray to be taken consistently in addition to your antihistamine.  You may also use saline sinus rinses, plain Mucinex  as needed.  For your chronic leg ulcer, clean the area daily with Hibiclens  and apply the mupirocin  ointment or Vaseline/Aquaphor type barrier cream and a nonstick dressing secured with Coban wrap and follow-up with your primary care provider for a recheck and further evaluation if you still feel that your eyes are discolored in addition to rechecking the other concerns

## 2024-05-03 NOTE — ED Provider Notes (Signed)
 [facility]    CSN: [id number] Arrival date & time: 05/03/24  1214      History   Chief Complaint No chief complaint on file.   HPI Jimmy Hall is a 68 y.o. male.   Patient presenting today with about 2 months of bilateral sinus pain, pressure, nasal congestion, cough.  Denies fever, chills, chest pain, shortness of breath, abdominal pain, vomiting, diarrhea.  Takes a half of an antihistamine daily and occasionally Flonase , otherwise not trying anything over-the-counter for his symptoms.  Also complaining of dark discoloration to the whites of his eyes first noticed today.  Denies visual change, eye pain, known history of this.  Also notes a chronic scabbed ulceration to the right lower leg that he states has been present for about 3 years and waxes and wanes.  Not currently try anything topically.  Has seen multiple doctors for this in the past per patient.    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 12/17/2023   Allergic rhinitis 12/17/2023   Gout 12/17/2023   Hyperlipidemia 12/17/2023   GERD (gastroesophageal reflux disease) 12/17/2023   Cephalalgia 04/02/2021   Neck pain 04/02/2021   Spondylolisthesis of lumbar region 03/02/2014   Cervical stenosis of spinal canal 12/29/2013    Past Surgical History:  Procedure Laterality Date   ANTERIOR CERVICAL DECOMP/DISCECTOMY FUSION N/A 12/29/2013   Procedure: ANTERIOR CERVICAL DECOMPRESSION/DISCECTOMY FUSION 3 LEVELS;  Surgeon: Catalina CHRISTELLA Stains, MD;  Location: [facility];  Service: Neurosurgery;   Laterality: N/A;  Anterior Cervical Three-Four/Four-Five/Five-Six  Decompression/Diskectomy/Fusion   BACK SURGERY  02/2014   COLONOSCOPY WITH PROPOFOL  N/A 02/10/2022   Procedure: COLONOSCOPY WITH PROPOFOL ;  Surgeon: Cindie Carlin POUR, DO;  Location: [facility];  Service: Endoscopy;  Laterality: N/A;  10:00 / ASA 2   HERNIA REPAIR     NECK SURGERY     POLYPECTOMY  02/10/2022   Procedure: POLYPECTOMY;  Surgeon: Cindie Carlin POUR, DO;  Location: [facility];  Service: Endoscopy;;       Home Medications    Prior to Admission medications   Medication Sig Start Date End Date Taking? Authorizing Provider  amoxicillin -clavulanate (AUGMENTIN ) 875-125 MG tablet Take 1 tablet by mouth every 12 (twelve) hours. 05/03/24  Yes Stuart Vernell Norris, PA-C  azelastine  (ASTELIN ) 0.1 % nasal spray Place 1 spray into both nostrils 2 (two) times daily. Use in each nostril as directed 05/03/24  Yes Stuart Vernell Norris, PA-C  chlorhexidine  (HIBICLENS ) 4 % external liquid Apply topically daily as needed. 05/03/24  Yes Stuart Vernell Norris, PA-C  mupirocin  ointment (BACTROBAN ) 2 % Apply 1 Application topically 2 (two) times daily. 05/03/24  Yes Stuart Vernell Norris, PA-C  albuterol  (VENTOLIN  HFA) [phone] Base) MCG/ACT inhaler Inhale 2 puffs into the lungs every 6 (six) hours as needed for wheezing or shortness of breath. 12/17/23   Melvenia Manus BRAVO, MD  allopurinol  (ZYLOPRIM ) 100 MG tablet Take 1 tablet (  100 mg total) by mouth daily. 05/03/23   Zollie Lowers, MD  fexofenadine  (ALLEGRA ) 180 MG tablet Take 1 tablet (180 mg total) by mouth daily. For allergy symptoms 05/03/23   Zollie Lowers, MD  fluticasone  (FLONASE ) 50 MCG/ACT nasal spray Place 2 sprays into both nostrils daily. 12/17/23   Melvenia Manus BRAVO, MD  triamcinolone  cream (KENALOG ) 0.1 % Apply 1 Application topically 3 (three) times daily. Avoid face and genitalia 12/17/23   Melvenia Manus BRAVO, MD    Family History Family History  Problem Relation  Age of Onset   Stroke Mother    Crohn's disease Son    Colon cancer Neg Hx     Social History Social History   Tobacco Use   Smoking status: Former    Current packs/day: 0.00    Average packs/day: 0.5 packs/day for 17.5 years (8.8 ttl pk-yrs)    Types: Cigarettes    Start date: 03/07/1974    Quit date: 09/24/1991    Years since quitting: 32.6   Smokeless tobacco: Never  Vaping Use   Vaping status: Never Used  Substance Use Topics   Alcohol use: Not Currently    Comment: beer occasionally   Drug use: No     Allergies   Flexeril  [cyclobenzaprine ] and Ibuprofen    Review of Systems Review of Systems Per HPI  Physical Exam Triage Vital Signs ED Triage Vitals  Encounter Vitals Group     BP 05/03/24 1220 127/80     Girls Systolic BP Percentile --      Girls Diastolic BP Percentile --      Boys Systolic BP Percentile --      Boys Diastolic BP Percentile --      Pulse Rate 05/03/24 1220 81     Resp 05/03/24 1220 16     Temp 05/03/24 1220 98.1 F (36.7 C)     Temp Source 05/03/24 1220 Oral     SpO2 05/03/24 1220 93 %     Weight --      Height --      Head Circumference --      Peak Flow --      Pain Score 05/03/24 1221 8     Pain Loc --      Pain Education --      Exclude from Growth Chart --    No data found.  Updated Vital Signs BP 127/80 (BP Location: Right Arm)   Pulse 81   Temp 98.1 F (36.7 C) (Oral)   Resp 16   SpO2 93%   Visual Acuity Right Eye Distance:   Left Eye Distance:   Bilateral Distance:    Right Eye Near:   Left Eye Near:    Bilateral Near:     Physical Exam Vitals and nursing note reviewed.  Constitutional:      Appearance: He is well-developed.  HENT:     Head: Atraumatic.     Right Ear: External ear normal.     Left Ear: External ear normal.     Nose: Congestion present.     Mouth/Throat:     Pharynx: Posterior oropharyngeal erythema present. No oropharyngeal exudate.  Eyes:     Conjunctiva/sclera: Conjunctivae normal.      Pupils: Pupils are equal, round, and reactive to light.     Comments: No obvious discoloration to the sclera bilaterally  Cardiovascular:     Rate and Rhythm: Normal rate and regular rhythm.  Pulmonary:     Effort: Pulmonary effort is normal.  No respiratory distress.     Breath sounds: No wheezing or rales.  Musculoskeletal:        General: Swelling present. Normal range of motion.     Cervical back: Normal range of motion and neck supple.     Comments: 1+ edema bilateral lower legs  Lymphadenopathy:     Cervical: No cervical adenopathy.  Skin:    General: Skin is warm and dry.     Comments: Large area of scabbed and in places hypopigmented chronic appearing lesion to the right lower leg  Neurological:     Mental Status: He is alert and oriented to person, place, and time.     Comments: Bilateral lower extremities neurovascular intact  Psychiatric:        Behavior: Behavior normal.      UC Treatments / Results  Labs (all labs ordered are listed, but only abnormal results are displayed) Labs Reviewed - No data to display  EKG   Radiology No results found.  Procedures Procedures (including critical care time)  Medications Ordered in UC Medications - No data to display  Initial Impression / Assessment and Plan / UC Course  I have reviewed the triage vital signs and the nursing notes.  Pertinent labs & imaging results that were available during my care of the patient were reviewed by me and considered in my medical decision making (see chart for details).     Given duration worsening course, will treat for sinusitis with Augmentin , add Astelin  nasal spray and discussed Coricidin HBP and other over-the-counter remedies as well.  Continue antihistamine for seasonal allergies.  Regarding the suspected discoloration of the eye, offered labs but he wishes to do these with primary care.  Continue to monitor.  Possibly venous stasis ulcer to the right lower leg, discussed  topical wound care with Hibiclens , mupirocin , nonstick dressings to help prevent infection.  Return for worsening symptoms.  Final Clinical Impressions(s) / UC Diagnoses   Final diagnoses:  Acute maxillary sinusitis, recurrence not specified  Seasonal allergic rhinitis due to other allergic trigger  Chronic ulcer of right leg, limited to breakdown of skin (HCC)  Discoloration of eye     Discharge Instructions      I have prescribed an antibiotic for your worsening sinus symptoms and a new nasal spray to be taken consistently in addition to your antihistamine.  You may also use saline sinus rinses, plain Mucinex  as needed.  For your chronic leg ulcer, clean the area daily with Hibiclens  and apply the mupirocin  ointment or Vaseline/Aquaphor type barrier cream and a nonstick dressing secured with Coban wrap and follow-up with your primary care provider for a recheck and further evaluation if you still feel that your eyes are discolored in addition to rechecking the other concerns    ED Prescriptions     Medication Sig Dispense Auth. Provider   amoxicillin -clavulanate (AUGMENTIN ) 875-125 MG tablet Take 1 tablet by mouth every 12 (twelve) hours. 14 tablet Stuart Vernell Norris, PA-C   azelastine  (ASTELIN ) 0.1 % nasal spray Place 1 spray into both nostrils 2 (two) times daily. Use in each nostril as directed 30 mL Stuart Vernell Norris, PA-C   chlorhexidine  (HIBICLENS ) 4 % external liquid Apply topically daily as needed. 236 mL Stuart Vernell Norris, PA-C   mupirocin  ointment (BACTROBAN ) 2 % Apply 1 Application topically 2 (two) times daily. 60 g Stuart Vernell Norris, [place]      PDMP not reviewed this encounter.   Stuart Vernell Norris, [place] 05/03/24 1944

## 2024-05-03 NOTE — ED Triage Notes (Signed)
 Pt reports he has right  side sinus pressure, cough, and dizziness x  2 months  Partner states the white part of pt eyes are dark.  Took headache meds, Flonase  but no relief

## 2024-06-06 ENCOUNTER — Ambulatory Visit (INDEPENDENT_AMBULATORY_CARE_PROVIDER_SITE_OTHER)

## 2024-06-06 VITALS — BP 118/82 | HR 71 | Ht 70.0 in | Wt 233.0 lb

## 2024-06-06 DIAGNOSIS — E559 Vitamin D deficiency, unspecified: Secondary | ICD-10-CM | POA: Diagnosis not present

## 2024-06-06 DIAGNOSIS — R7309 Other abnormal glucose: Secondary | ICD-10-CM | POA: Diagnosis not present

## 2024-06-06 DIAGNOSIS — E785 Hyperlipidemia, unspecified: Secondary | ICD-10-CM

## 2024-06-06 DIAGNOSIS — J302 Other seasonal allergic rhinitis: Secondary | ICD-10-CM

## 2024-06-06 DIAGNOSIS — I872 Venous insufficiency (chronic) (peripheral): Secondary | ICD-10-CM

## 2024-06-06 MED ORDER — TRIAMCINOLONE ACETONIDE 0.1 % EX CREA: 1.0000 | TOPICAL_CREAM | Freq: Three times a day (TID) | CUTANEOUS | 2 refills | Status: AC

## 2024-06-06 MED ORDER — FEXOFENADINE HCL 180 MG PO TABS: 180.0000 mg | ORAL_TABLET | Freq: Every day | ORAL | 11 refills | Status: AC

## 2024-06-06 MED ORDER — ASTEPRO 205.5 MCG/SPRAY NA SOLN
1.0000 | Freq: Two times a day (BID) | NASAL | 11 refills | Status: AC
Start: 2024-06-06 — End: ?

## 2024-06-06 NOTE — Assessment & Plan Note (Signed)
 Recheck levels today

## 2024-06-06 NOTE — Assessment & Plan Note (Signed)
 There is evidence of chronic venous stasis dermatitis on the medial aspect of the right lower leg.  Triamcinolone  cream refilled today.Referral placed to dermatology for further evaluation of this ongoing rash.

## 2024-06-06 NOTE — Progress Notes (Signed)
 Established Patient Office Visit  Subjective   Patient ID: Jimmy Hall, male    DOB: January 03, 1956  Age: 68 y.o. MRN: [id number]  Chief Complaint  Patient presents with   Medical Management of Chronic Issues    6 month follow up     HPI  Discussed the use of AI scribe software for clinical note transcription with the patient, who gave verbal consent to proceed.  History of Present Illness   Jimmy Hall is a 68 year old male who presents with sinus drainage and chest rattling. He is accompanied by Roseline.  Upper respiratory symptoms - Sinus drainage present for the past few months - Chest rattling sound, particularly noticeable when lying down - Flonase  nasal spray in use - Occasional use of Allegra  for allergies, not daily - Ragweed and certain grasses currently high, possibly contributing to symptoms  Chronic right ankle lesion - Large scar on right ankle present for almost three years - Lesion intermittently becomes raw, bleeds, oozes, and itches - Topical cream used for symptom management; requires prescription refill - Area is wrapped to manage symptoms - Uncertain etiology, possibly related to poor circulation - No significant leg swelling, but mild swelling occurs with prolonged standing  Blood pressure management - Previously on antihypertensive medication, discontinued due to normal blood pressure readings - Not currently taking any blood pressure medication      Patient Active Problem List   Diagnosis Date Noted   Elevated hemoglobin A1c 06/06/2024   Vitamin D  deficiency 06/06/2024   Asthmatic bronchitis 12/17/2023   Chronic stasis dermatitis of right lower extremity 12/17/2023   Allergic rhinitis 12/17/2023   Gout 12/17/2023   Hyperlipidemia 12/17/2023   GERD (gastroesophageal reflux disease) 12/17/2023   Cephalalgia 04/02/2021   Neck pain 04/02/2021   Spondylolisthesis of lumbar region 03/02/2014   Cervical stenosis of spinal canal 12/29/2013       ROS    Objective:     BP 118/82 (BP Location: Left Arm, Patient Position: Sitting, Cuff Size: Normal)   Pulse 71   Ht 5' 10 (1.778 m)   Wt 233 lb (105.7 kg)   SpO2 95%   BMI 33.43 kg/m  BP Readings from Last 3 Encounters:  06/06/24 118/82  05/03/24 127/80  12/17/23 120/74   Wt Readings from Last 3 Encounters:  06/06/24 233 lb (105.7 kg)  12/17/23 245 lb (111.1 kg)  05/03/23 243 lb 9.6 oz (110.5 kg)      Physical Exam Vitals and nursing note reviewed.  Constitutional:      Appearance: Normal appearance.  HENT:     Head: Normocephalic.  Eyes:     Extraocular Movements: Extraocular movements intact.     Pupils: Pupils are equal, round, and reactive to light.  Cardiovascular:     Rate and Rhythm: Normal rate and regular rhythm.  Pulmonary:     Effort: Pulmonary effort is normal.     Breath sounds: Normal breath sounds.  Musculoskeletal:     Cervical back: Normal range of motion and neck supple.  Neurological:     Mental Status: He is alert and oriented to person, place, and time.  Psychiatric:        Mood and Affect: Mood normal.        Thought Content: Thought content normal.      No results found for any visits on 06/06/24.  Last CBC Lab Results  Component Value Date   WBC 6.9 12/17/2023   HGB 14.1 12/17/2023  HCT 45.5 12/17/2023   MCV 89 12/17/2023   MCH 27.6 12/17/2023   RDW 12.2 12/17/2023   PLT 219 12/17/2023   Last metabolic panel Lab Results  Component Value Date   GLUCOSE 88 12/17/2023   NA 142 12/17/2023   K 4.9 12/17/2023   CL 104 12/17/2023   CO2 23 12/17/2023   BUN 16 12/17/2023   CREATININE 1.33 (H) 12/17/2023   EGFR 59 (L) 12/17/2023   CALCIUM  9.4 12/17/2023   PROT 6.7 12/17/2023   ALBUMIN 4.1 12/17/2023   LABGLOB 2.6 12/17/2023   AGRATIO 1.2 11/02/2022   BILITOT 0.5 12/17/2023   ALKPHOS 69 12/17/2023   AST 21 12/17/2023   ALT 18 12/17/2023   ANIONGAP 10 04/20/2020   Last lipids Lab Results  Component Value Date    CHOL 217 (H) 12/17/2023   HDL 46 12/17/2023   LDLCALC 153 (H) 12/17/2023   TRIG 98 12/17/2023   CHOLHDL 4.7 12/17/2023   Last hemoglobin A1c Lab Results  Component Value Date   HGBA1C 5.7 (H) 12/17/2023   Last thyroid  functions Lab Results  Component Value Date   TSH 3.180 12/17/2023   Last vitamin D  Lab Results  Component Value Date   VD25OH 23.0 (L) 12/17/2023   Last vitamin B12 and Folate Lab Results  Component Value Date   VITAMINB12 730 12/17/2023   FOLATE 8.7 12/17/2023     The 10-year ASCVD risk score (Arnett DK, et al., 2019) is: 10%    Assessment & Plan:   Problem List Items Addressed This Visit       Cardiovascular and Mediastinum   Chronic stasis dermatitis of right lower extremity   There is evidence of chronic venous stasis dermatitis on the medial aspect of the right lower leg.  Triamcinolone  cream refilled today.Referral placed to dermatology for further evaluation of this ongoing rash.       Relevant Medications   triamcinolone  cream (KENALOG ) 0.1 %   Other Relevant Orders   Ambulatory referral to Dermatology     Respiratory   Allergic rhinitis   He endorses persistent sinus congestion and rhinorrhea.  Currently prescribed fluticasone  nasal spray but endorses intermittent use.  He is also prescribed Allegra  but has not been taking it consistently.  I discussed need for taking this consistently.  Allegra  refilled and Astepro  added.        Relevant Medications   fexofenadine  (ALLEGRA ) 180 MG tablet   Azelastine  HCl (ASTEPRO ) 0.15 % SOLN     Other   Hyperlipidemia - Primary   Recheck fasting labs.        Relevant Orders   CMP14+EGFR   Lipid Profile   Elevated hemoglobin A1c   Recheck A1C level today.       Relevant Orders   HgB A1c   Vitamin D  deficiency   Recheck levels today.       Relevant Orders   Vitamin D  (25 hydroxy)         No follow-ups on file.    Leita Longs, FNP

## 2024-06-06 NOTE — Assessment & Plan Note (Signed)
 Recheck A1C level today.

## 2024-06-06 NOTE — Assessment & Plan Note (Signed)
Recheck fasting labs.  

## 2024-06-06 NOTE — Assessment & Plan Note (Signed)
 He endorses persistent sinus congestion and rhinorrhea.  Currently prescribed fluticasone  nasal spray but endorses intermittent use.  He is also prescribed Allegra  but has not been taking it consistently.  I discussed need for taking this consistently.  Allegra  refilled and Astepro  added.

## 2024-06-07 ENCOUNTER — Telehealth: Payer: Self-pay

## 2024-06-07 ENCOUNTER — Ambulatory Visit (INDEPENDENT_AMBULATORY_CARE_PROVIDER_SITE_OTHER)

## 2024-06-07 VITALS — BP 118/82 | Ht 70.0 in | Wt 233.0 lb

## 2024-06-07 DIAGNOSIS — Z0001 Encounter for general adult medical examination with abnormal findings: Secondary | ICD-10-CM | POA: Diagnosis not present

## 2024-06-07 DIAGNOSIS — Z0000 Encounter for general adult medical examination without abnormal findings: Secondary | ICD-10-CM

## 2024-06-07 DIAGNOSIS — R7309 Other abnormal glucose: Secondary | ICD-10-CM | POA: Diagnosis not present

## 2024-06-07 DIAGNOSIS — E785 Hyperlipidemia, unspecified: Secondary | ICD-10-CM

## 2024-06-07 DIAGNOSIS — I872 Venous insufficiency (chronic) (peripheral): Secondary | ICD-10-CM

## 2024-06-07 LAB — CMP14+EGFR
ALT: 18 IU/L (ref 0–44)
AST: 16 IU/L (ref 0–40)
Albumin: 4.1 g/dL (ref 3.9–4.9)
Alkaline Phosphatase: 65 IU/L (ref 44–121)
BUN/Creatinine Ratio: 10 (ref 10–24)
BUN: 13 mg/dL (ref 8–27)
Bilirubin Total: 0.4 mg/dL (ref 0.0–1.2)
CO2: 22 mmol/L (ref 20–29)
Calcium: 9.8 mg/dL (ref 8.6–10.2)
Chloride: 101 mmol/L (ref 96–106)
Creatinine, Ser: 1.3 mg/dL — ABNORMAL HIGH (ref 0.76–1.27)
Globulin, Total: 3 g/dL (ref 1.5–4.5)
Glucose: 91 mg/dL (ref 70–99)
Potassium: 4.9 mmol/L (ref 3.5–5.2)
Sodium: 139 mmol/L (ref 134–144)
Total Protein: 7.1 g/dL (ref 6.0–8.5)
eGFR: 60 mL/min/1.73 (ref >59)

## 2024-06-07 LAB — LIPID PANEL
Chol/HDL Ratio: 5.4 ratio — ABNORMAL HIGH (ref 0.0–5.0)
Cholesterol, Total: 267 mg/dL — ABNORMAL HIGH (ref 100–199)
HDL: 49 mg/dL (ref >39)
LDL Chol Calc (NIH): 202 mg/dL — ABNORMAL HIGH (ref 0–99)
Triglycerides: 92 mg/dL (ref 0–149)
VLDL Cholesterol Cal: 16 mg/dL (ref 5–40)

## 2024-06-07 LAB — HEMOGLOBIN A1C
Est. average glucose Bld gHb Est-mCnc: 123 mg/dL
Hgb A1c MFr Bld: 5.9 % — ABNORMAL HIGH (ref 4.8–5.6)

## 2024-06-07 LAB — VITAMIN D 25 HYDROXY (VIT D DEFICIENCY, FRACTURES): Vit D, 25-Hydroxy: 25.2 ng/mL — ABNORMAL LOW (ref 30.0–100.0)

## 2024-06-07 NOTE — Patient Instructions (Signed)
 Mr. Jimmy Hall , Thank you for taking time out of your busy schedule to complete your Annual Wellness Visit with me. I enjoyed our conversation and look forward to speaking with you again next year. I, as well as your care team,  appreciate your ongoing commitment to your health goals. Please review the following plan we discussed and let me know if I can assist you in the future.  Your Game plan/ To Do List  Referrals/Orders: If you haven't heard from the office you've been referred to, please reach out to them at the phone provided.   A referral has been placed for you to check and see what additional resources are available to you.   If you haven't heard from anyone within the next 7 business days, please call them and let them know a referral has been placed for you Phone: [phone]   Follow up Visits: We will see or speak with you next year for your Next Medicare AWV with our clinical staff  Clinician Recommendations:  Aim for 30 minutes of exercise or brisk walking, 6-8 glasses of water, and 5 servings of fruits and vegetables each day.    Wishing you many blessings and good health during the next year until our next visit.  -Joyia Riehle   This is a list of the screenings recommended for you:  Health Maintenance  Topic Date Due   COVID-19 Vaccine (3 - 2025-26 season) 06/05/2024   Flu Shot  01/02/2025*   Medicare Annual Wellness Visit  06/07/2025   Colon Cancer Screening  02/11/2027   DTaP/Tdap/Td vaccine (2 - Td or Tdap) 04/27/2032   Pneumococcal Vaccine for age over 32  Completed   Hepatitis C Screening  Completed   Zoster (Shingles) Vaccine  Completed   HPV Vaccine  Aged Out   Meningitis B Vaccine  Aged Out  *Topic was postponed. The date shown is not the original due date.    Advanced directives: (Declined) Advance directive discussed with you today. Even though you declined this today, please call our office should you change your mind, and we can give you the proper paperwork  for you to fill out. Advance Care Planning is important because it:  [x]  Makes sure you receive the medical care that is consistent with your values, goals, and preferences  [x]  It provides guidance to your family and loved ones and reduces their decisional burden about whether or not they are making the right decisions based on your wishes.  Follow the link provided in your after visit summary or read over the paperwork we have mailed to you to help you started getting your Advance Directives in place. If you need assistance in completing these, please reach out to us  so that we can help you!  Information on Advanced Care Planning can be found at [place]  Secretary of [facility] Advance Health Care Directives Advance Health Care Directives ([email or web address]   See attachments for Preventive Care and Fall Prevention Tips.

## 2024-06-07 NOTE — Telephone Encounter (Signed)
 Copied from CRM #[id number]. Topic: General - Other >> Jun 07, 2024 12:51 PM Shardie S wrote: Reason for CRM: Att: Abby- Patient returning missed call for AWV. Requesting a callback.

## 2024-06-07 NOTE — Progress Notes (Signed)
 Subjective:   Jimmy Hall is a 68 y.o. who presents for a Medicare Wellness preventive visit.  As a reminder, Annual Wellness Visits don't include a physical exam, and some assessments may be limited, especially if this visit is performed virtually. We may recommend an in-person follow-up visit with your provider if needed.  Visit Complete: Virtual I connected with  Miller L Werden on 06/07/24 by a audio enabled telemedicine application and verified that I am speaking with the correct person using two identifiers.  Patient Location: Home  Provider Location: Home Office  I discussed the limitations of evaluation and management by telemedicine. The patient expressed understanding and agreed to proceed.  Vital Signs: Because this visit was a virtual/telehealth visit, some criteria may be missing or patient reported. Any vitals not documented were not able to be obtained and vitals that have been documented are patient reported.  VideoDeclined- This patient declined Librarian, academic. Therefore the visit was completed with audio only.  Persons Participating in Visit: Patient.  AWV Questionnaire: No: Patient Medicare AWV questionnaire was not completed prior to this visit.  Cardiac Risk Factors include: advanced age (>31men, >48 women);dyslipidemia;male gender;obesity (BMI >30kg/m2)     Objective:    Today's Vitals   06/07/24 1524  BP: 118/82  Weight: 233 lb (105.7 kg)  Height: 5' 10 (1.778 m)   Body mass index is 33.43 kg/m.     06/07/2024    3:25 PM 01/06/2023    8:24 AM 02/10/2022    8:49 AM 12/30/2021   12:54 PM 10/28/2021    9:14 AM 04/20/2020    4:54 PM 03/15/2019    2:32 PM  Advanced Directives  Does Patient Have a Medical Advance Directive? No No No No No No No  Would patient like information on creating a medical advance directive? No - Patient declined No - Patient declined No - Patient declined  No - Patient declined      Current  Medications (verified) Outpatient Encounter Medications as of 06/07/2024  Medication Sig   albuterol  (VENTOLIN  HFA) 108 (90 Base) MCG/ACT inhaler Inhale 2 puffs into the lungs every 6 (six) hours as needed for wheezing or shortness of breath.   allopurinol  (ZYLOPRIM ) 100 MG tablet Take 1 tablet (100 mg total) by mouth daily.   Azelastine  HCl (ASTEPRO ) 0.15 % SOLN Place 1 spray into the nose in the morning and at bedtime.   fexofenadine  (ALLEGRA ) 180 MG tablet Take 1 tablet (180 mg total) by mouth daily. For allergy symptoms   fluticasone  (FLONASE ) 50 MCG/ACT nasal spray Place 2 sprays into both nostrils daily.   mupirocin  ointment (BACTROBAN ) 2 % Apply 1 Application topically 2 (two) times daily.   triamcinolone  cream (KENALOG ) 0.1 % Apply 1 Application topically 3 (three) times daily. Avoid face and genitalia   No facility-administered encounter medications on file as of 06/07/2024.    Allergies (verified) Flexeril  [cyclobenzaprine ] and Ibuprofen    History: Past Medical History:  Diagnosis Date   Chronic back pain    spondylolisthesis   History of bronchitis    46yrs ago and only one time   History of gout    not on any meds   Hyperlipidemia    Joint pain    knees   Numbness    left 4th and 5th finger   Seasonal allergies    takes OTC meds maybe once a week   Past Surgical History:  Procedure Laterality Date   ANTERIOR CERVICAL DECOMP/DISCECTOMY FUSION N/A  12/29/2013   Procedure: ANTERIOR CERVICAL DECOMPRESSION/DISCECTOMY FUSION 3 LEVELS;  Surgeon: Catalina CHRISTELLA Stains, MD;  Location: [facility];  Service: Neurosurgery;  Laterality: N/A;  Anterior Cervical Three-Four/Four-Five/Five-Six  Decompression/Diskectomy/Fusion   BACK SURGERY  02/2014   COLONOSCOPY WITH PROPOFOL  N/A 02/10/2022   Procedure: COLONOSCOPY WITH PROPOFOL ;  Surgeon: Cindie Carlin POUR, DO;  Location: [facility];  Service: Endoscopy;  Laterality: N/A;  10:00 / ASA 2   HERNIA REPAIR     NECK SURGERY      POLYPECTOMY  02/10/2022   Procedure: POLYPECTOMY;  Surgeon: Cindie Carlin POUR, DO;  Location: [facility];  Service: Endoscopy;;   Family History  Problem Relation Age of Onset   Stroke Mother    Crohn's disease Son    Colon cancer Neg Hx    Social History   Socioeconomic History   Marital status: Widowed    Spouse name: Not on file   Number of children: 4   Years of education: Not on file   Highest education level: 12th grade  Occupational History   Not on file  Tobacco Use   Smoking status: Former    Current packs/day: 0.00    Average packs/day: 0.5 packs/day for 17.5 years (8.8 ttl pk-yrs)    Types: Cigarettes    Start date: 03/07/1974    Quit date: 09/24/1991    Years since quitting: 32.7   Smokeless tobacco: Never  Vaping Use   Vaping status: Never Used  Substance and Sexual Activity   Alcohol use: Not Currently    Comment: beer occasionally   Drug use: No   Sexual activity: Not on file  Other Topics Concern   Not on file  Social History Narrative   Recently widowed, 4 children all live locally , 7 grandchildren, 2 grandchildren live with him   Social Drivers of Corporate investment banker Strain: High Risk (06/07/2024)   Overall Financial Resource Strain (CARDIA)    Difficulty of Paying Living Expenses: Very hard  Food Insecurity: Food Insecurity Present (06/07/2024)   Hunger Vital Sign    Worried About Running Out of Food in the Last Year: Often true    Ran Out of Food in the Last Year: Never true  Transportation Needs: No Transportation Needs (06/07/2024)   PRAPARE - Administrator, Civil Service (Medical): No    Lack of Transportation (Non-Medical): No  Physical Activity: Sufficiently Active (06/07/2024)   Exercise Vital Sign    Days of Exercise per Week: 7 days    Minutes of Exercise per Session: 30 min  Stress: No Stress Concern Present (06/07/2024)   [employer] of Occupational Health - Occupational Stress Questionnaire    Feeling of  Stress: Not at all  Social Connections: Moderately Isolated (06/07/2024)   Social Connection and Isolation Panel    Frequency of Communication with Friends and Family: More than three times a week    Frequency of Social Gatherings with Friends and Family: More than three times a week    Attends Religious Services: More than 4 times per year    Active Member of [employer] or Organizations: No    Attends Banker Meetings: Never    Marital Status: Widowed    Tobacco Counseling Counseling given: Yes    Clinical Intake:  Pre-visit preparation completed: Yes  Pain : No/denies pain     BMI - recorded: 33.43 Nutritional Status: BMI > 30  Obese Nutritional Risks: None Diabetes: No  Lab Results  Component Value Date   HGBA1C 5.9 (H) 06/06/2024   HGBA1C 5.7 (H) 12/17/2023   HGBA1C 5.5 10/28/2021     How often do you need to have someone help you when you read instructions, pamphlets, or other written materials from your doctor or pharmacy?: 1 - Never  Interpreter Needed?: No  Information entered by :: Eldon Zietlow W CMA (AAMA)   Activities of Daily Living     06/07/2024    3:26 PM  In your present state of health, do you have any difficulty performing the following activities:  Hearing? 0  Vision? 0  Difficulty concentrating or making decisions? 0  Walking or climbing stairs? 0  Dressing or bathing? 0  Doing errands, shopping? 0  Preparing Food and eating ? N  Using the Toilet? N  In the past six months, have you accidently leaked urine? N  Do you have problems with loss of bowel control? N  Managing your Medications? N  Managing your Finances? N  Housekeeping or managing your Housekeeping? N    Patient Care Team: Bevely Doffing, FNP as PCP - General (Family Medicine)  I have updated your Care Teams any recent Medical Services you may have received from other providers in the past year.     Assessment:   This is a routine wellness examination for  Jimmy Hall.  Hearing/Vision screen Hearing Screening - Comments:: Patient denies any hearing difficulties.   Vision Screening - Comments:: Wears rx glasses - up to date with routine eye exams with  Thresa Louder, [facility]   Goals Addressed             This Visit's Progress    Exercise 3x per week (30 min per time)   On track      Depression Screen     06/07/2024    3:28 PM 06/06/2024    9:41 AM 12/17/2023    8:59 AM 05/03/2023    9:25 AM 05/03/2023    9:13 AM 01/06/2023    8:23 AM 11/25/2022   11:14 AM  PHQ 2/9 Scores  PHQ - 2 Score 0 0 0 0 0 0 0  PHQ- 9 Score 0 1 1 1   0 1    Fall Risk     06/07/2024    3:27 PM 12/17/2023    8:59 AM 05/03/2023    9:13 AM 01/06/2023    8:20 AM 11/25/2022   11:14 AM  Fall Risk   Falls in the past year? 0 0 0 0 0  Number falls in past yr: 0 0  0   Injury with Fall? 0 0  0   Risk for fall due to : No Fall Risks No Fall Risks  No Fall Risks   Follow up Falls evaluation completed;Education provided;Falls prevention discussed Falls evaluation completed  Falls prevention discussed     MEDICARE RISK AT HOME:  Medicare Risk at Home Any stairs in or around the home?: Yes If so, are there any without handrails?: No Home free of loose throw rugs in walkways, pet beds, electrical cords, etc?: Yes Adequate lighting in your home to reduce risk of falls?: Yes Life alert?: No Use of a cane, walker or w/c?: No Grab bars in the bathroom?: No Shower chair or bench in shower?: No Elevated toilet seat or a handicapped toilet?: No  TIMED UP AND GO:  Was the test performed?  No  Cognitive Function: 6CIT completed        06/07/2024  3:27 PM 01/06/2023    8:24 AM 10/28/2021    9:17 AM  6CIT Screen  What Year? 0 points 0 points 0 points  What month? 0 points 0 points 0 points  What time? 0 points 0 points 0 points  Count back from 20 0 points 0 points 0 points  Months in reverse 0 points 0 points 4 points  Repeat phrase 0 points 0 points 0 points   Total Score 0 points 0 points 4 points    Immunizations Immunization History  Administered Date(s) Administered   Fluad Quad(high Dose 65+) 08/08/2021   Influenza,inj,Quad PF,6+ Mos 08/25/2014, 06/15/2018, 08/16/2019   Influenza-Unspecified 06/15/2018   Moderna Sars-Covid-2 Vaccination 01/04/2020, 02/01/2020   PNEUMOCOCCAL CONJUGATE-20 10/28/2021   Tdap 04/27/2022   Zoster Recombinant(Shingrix ) 10/28/2021, 11/02/2022    Screening Tests Health Maintenance  Topic Date Due   Medicare Annual Wellness (AWV)  01/06/2024   COVID-19 Vaccine (3 - 2025-26 season) 06/05/2024   INFLUENZA VACCINE  01/02/2025 (Originally 05/05/2024)   Colonoscopy  02/11/2027   DTaP/Tdap/Td (2 - Td or Tdap) 04/27/2032   Pneumococcal Vaccine: 50+ Years  Completed   Hepatitis C Screening  Completed   Zoster Vaccines- Shingrix   Completed   HPV VACCINES  Aged Out   Meningococcal B Vaccine  Aged Out    Health Maintenance  Health Maintenance Due  Topic Date Due   Medicare Annual Wellness (AWV)  01/06/2024   COVID-19 Vaccine (3 - 2025-26 season) 06/05/2024   Health Maintenance Items Addressed: Flu vaccine scheduled  Additional Screening:  Vision Screening: Recommended annual ophthalmology exams for early detection of glaucoma and other disorders of the eye. Would you like a referral to an eye doctor? No    Dental Screening: Recommended annual dental exams for proper oral hygiene  Community Resource Referral / Chronic Care Management: CRR required this visit?  Yes   CCM required this visit?  No   Plan:    I have personally reviewed and noted the following in the patient's chart:   Medical and social history Use of alcohol, tobacco or illicit drugs  Current medications and supplements including opioid prescriptions. Patient is not currently taking opioid prescriptions. Functional ability and status Nutritional status Physical activity Advanced directives List of other  physicians Hospitalizations, surgeries, and ER visits in previous 12 months Vitals Screenings to include cognitive, depression, and falls Referrals and appointments  In addition, I have reviewed and discussed with patient certain preventive protocols, quality metrics, and best practice recommendations. A written personalized care plan for preventive services as well as general preventive health recommendations were provided to patient.   Jarrick Fjeld, CMA   06/07/2024   After Visit Summary: (MyChart) Due to this being a telephonic visit, the after visit summary with patients personalized plan was offered to patient via MyChart   Notes: Nothing significant to report at this time.

## 2024-06-08 ENCOUNTER — Telehealth: Payer: Self-pay

## 2024-06-08 NOTE — Progress Notes (Signed)
 Complex Care Management Note Care Guide Note  06/08/2024 Name: SAMANYU TINNELL MRN: [id number] DOB: 17-Aug-1956   Complex Care Management Outreach Attempts: An unsuccessful telephone outreach was attempted today to offer the patient information about available complex care management services.  Follow Up Plan:  Additional outreach attempts will be made to offer the patient complex care management information and services.   Encounter Outcome:  No Answer  Jeoffrey Buffalo , RMA     [place]  [facility], [facility] Guide  Direct Dial: [phone]  Website: [email or web address]

## 2024-06-08 NOTE — Telephone Encounter (Signed)
 The patient called in returning a call to [facility] and I spoke with [facility] and she had me transfer the patient to her for further assistance.

## 2024-06-08 NOTE — Progress Notes (Signed)
 Complex Care Management Note  Care Guide Note 06/08/2024 Name: EMARI DEMMER MRN: [id number] DOB: 10-Dec-1955  Larnell LITTIE Ochs is a 68 y.o. year old male who sees Bevely Doffing, FNP for primary care. I reached out to Larnell LITTIE Ochs by phone today to offer complex care management services.  Mr. Ceci was given information about Complex Care Management services today including:   The Complex Care Management services include support from the care team which includes your Nurse Care Manager, Clinical Social Worker, or Pharmacist.  The Complex Care Management team is here to help remove barriers to the health concerns and goals most important to you. Complex Care Management services are voluntary, and the patient may decline or stop services at any time by request to their care team member.   Complex Care Management Consent Status: Patient agreed to services and verbal consent obtained.   Follow up plan:  Telephone appointment with complex care management team member scheduled for:  BSW 06/09/2024 RNCM 07/18/2024  Encounter Outcome:  Patient Scheduled  Jeoffrey Buffalo , RMA     [place]  [facility], [facility] Guide  Direct Dial: [phone]  Website: [email or web address]

## 2024-06-09 ENCOUNTER — Encounter: Payer: Self-pay | Admitting: Family Medicine

## 2024-06-09 ENCOUNTER — Ambulatory Visit (INDEPENDENT_AMBULATORY_CARE_PROVIDER_SITE_OTHER): Admitting: Family Medicine

## 2024-06-09 ENCOUNTER — Other Ambulatory Visit: Payer: Self-pay

## 2024-06-09 ENCOUNTER — Other Ambulatory Visit: Payer: Self-pay | Admitting: Internal Medicine

## 2024-06-09 ENCOUNTER — Ambulatory Visit: Payer: Self-pay

## 2024-06-09 VITALS — BP 122/65 | HR 66 | Temp 98.4°F | Resp 16 | Ht 70.0 in | Wt 239.0 lb

## 2024-06-09 DIAGNOSIS — B349 Viral infection, unspecified: Secondary | ICD-10-CM

## 2024-06-09 DIAGNOSIS — J3089 Other allergic rhinitis: Secondary | ICD-10-CM

## 2024-06-09 MED ORDER — BENZONATATE 200 MG PO CAPS: 200.0000 mg | ORAL_CAPSULE | Freq: Two times a day (BID) | ORAL | 1 refills | Status: AC | PRN

## 2024-06-09 MED ORDER — LIDOCAINE VISCOUS HCL 2 % MT SOLN: 15.0000 mL | OROMUCOSAL | 0 refills | Status: AC | PRN

## 2024-06-09 MED ORDER — PREDNISONE 10 MG (21) PO TBPK: ORAL_TABLET | ORAL | 0 refills | Status: AC

## 2024-06-09 NOTE — Telephone Encounter (Signed)
 Patient scheduled.

## 2024-06-09 NOTE — Patient Outreach (Signed)
 Complex Care Management   Visit Note  06/09/2024  Name:  Jimmy Hall MRN: [id number] DOB: 19-May-1956  Situation: Referral received for Complex Care Management related to SDOH Barriers:  Lack of essential utilities power bill Dental resourcesI obtained verbal consent from Patient.  Visit completed with Patient  on the phone  Background:   Past Medical History:  Diagnosis Date   Chronic back pain    spondylolisthesis   History of bronchitis    43yrs ago and only one time   History of gout    not on any meds   Hyperlipidemia    Joint pain    knees   Numbness    left 4th and 5th finger   Seasonal allergies    takes OTC meds maybe once a week    Assessment: SW completed a telephone outreach with patient, he states assistance with his utility bill is needed. Patient states he does not have a cut off notice and would like for resources to be mailed. Patient states he would also like resources for the dentist in [place], [place] and [place]. Patient states resources for rent and food are not needed at this time.  SDOH Interventions    Flowsheet Row Patient Outreach Telephone from 06/09/2024 in [place] [facility] Clinical Support from 06/07/2024 in [facility] Primary Care Office Visit from 06/06/2024 in [facility] Primary Care Office Visit from 12/17/2023 in [facility] Primary Care Office Visit from 05/03/2023 in [facility] Clinical Support from 01/06/2023 in [facility]  SDOH Interventions        Food Insecurity Interventions -- AMB Referral, [employer] Resources Referral -- -- -- Intervention Not Indicated  Housing Interventions -- AMB Referral, [employer] Referral -- -- -- Intervention Not Indicated  Transportation Interventions -- Intervention Not Indicated -- -- -- Intervention Not Indicated  Utilities Interventions Community  Resources Provided AMB Referral, [employer] Referral -- -- -- Intervention Not Indicated  Alcohol Usage Interventions -- Intervention Not Indicated (Score <7) -- -- -- Intervention Not Indicated (Score <7)  Depression Interventions/Treatment  -- PHQ2-9 Score <4 Follow-up Not Indicated PHQ2-9 Score <4 Follow-up Not Indicated PHQ2-9 Score <4 Follow-up Not Indicated PHQ2-9 Score <4 Follow-up Not Indicated --  Financial Strain Interventions -- [employer] Referral, Other (Comment)  [vbci referral] -- -- -- Intervention Not Indicated  Physical Activity Interventions -- Intervention Not Indicated -- -- -- --  Stress Interventions -- Intervention Not Indicated -- -- -- Intervention Not Indicated  Social Connections Interventions -- Patient Declined -- -- -- Intervention Not Indicated  Health Literacy Interventions -- Intervention Not Indicated -- -- -- --    Recommendation:   No recommendations at this time  Follow Up Plan:   Telephone follow-up on 06/27/24 at 9am with [facility].  Thersia Hoar, HEDWIG, MHA [place]  [facility] Social Worker, Population Health [phone]

## 2024-06-09 NOTE — Patient Instructions (Signed)
 Visit Information  Thank you for taking time to visit with me today. Please don't hesitate to contact me if I can be of assistance to you before our next scheduled appointment.  Our next appointment is by telephone on 06/27/24 at 9am Please call the care guide team at [phone] if you need to cancel or reschedule your appointment.   Following is a copy of your care plan:   Goals Addressed             This Visit's Progress    BSW [facility] Social Work Care Plan       Problems:   Corporate treasurer  and utilities  CSW Clinical Goal(s):   Over the next 30 days the Patient will will follow up with utility and dental resources as directed by Social Work.  Interventions:  SW mailed resources for utilities for [employer] and resources for dentist in [place].  Patient Goals/Self-Care Activities:  Patient will contact resources for assistance  Plan:   Patient will follow up with SW on 06/27/24 at 9am        Please call the Suicide and Crisis Lifeline: 988 call the [place]  National Suicide Prevention Lifeline: 585-014-3132 or TTY: 530-523-0201 TTY (270)654-5140) to talk to a trained counselor call 1-800-273-TALK (toll free, 24 hour hotline) call the [facility]: 605-321-3673 call 911 if you are experiencing a Mental Health or Behavioral Health Crisis or need someone to talk to.  Patient verbalizes understanding of instructions and care plan provided today and agrees to view in MyChart. Active MyChart status and patient understanding of how to access instructions and care plan via MyChart confirmed with patient.     Thersia Hoar, HEDWIG, MHA [place]  [facility] Social Worker, Population Health [phone]

## 2024-06-09 NOTE — Telephone Encounter (Signed)
 FYI Only or Action Required?: FYI only for provider.  Patient was last seen in primary care on 06/06/2024 by Bevely Doffing, FNP.  Called Nurse Triage reporting Sore Throat.  Symptoms began several days ago.  Interventions attempted: OTC medications: IBU, tylenol  Cold medication.  Symptoms are: stable. Pt states that sore throat was terrible last night - today it is not as bad.  Triage Disposition: See Physician Within 24 Hours  Patient/caregiver understands and will follow disposition?: Yes                Copied from CRM #[id number]. Topic: Clinical - Red Word Triage >> Jun 09, 2024  9:13 AM Treva T wrote: [facility] that prompted transfer to Nurse Triage: Patient calling, states he is having throat pain, feels like he is swallowing razor blades, also reports chest congestion, increased coughing, really bad headache, has some occasional vomiting, runny nose, and runny eyes. Reason for Disposition  SEVERE throat pain (e.g., excruciating)  Answer Assessment - Initial Assessment Questions 1. ONSET: When did the throat start hurting? (Hours or days ago)      yesterday 2. SEVERITY: How bad is the sore throat? (Scale 1-10; mild, moderate or severe)     Was  very sore last night 10/10 3. STREP EXPOSURE: Has there been any exposure to strep within the past week? If Yes, ask: What type of contact occurred?      possible 4.  VIRAL SYMPTOMS: Are there any symptoms of a cold, such as a runny nose, cough, hoarse voice or red eyes?      yes 5. FEVER: Do you have a fever? If Yes, ask: What is your temperature, how was it measured, and when did it start?     no 6. PUS ON THE TONSILS: Is there pus on the tonsils in the back of your throat?     Red and irriation 7. OTHER SYMPTOMS: Do you have any other symptoms? (e.g., difficulty breathing, headache, rash)     Runny nose cough, congestion  Protocols used: Sore Throat-A-AH

## 2024-06-09 NOTE — Assessment & Plan Note (Signed)
 COVID-19, Flu A+B RSV and Rapid Strep test ordered   Start Prednisone  starter pack Benzonatate  200 mg PRN, Lidocaine  viscous solution PRN, Advise patient to rest to support your body's recovery. Stay hydrated by drinking water, tea, or broth. Using a humidifier can help soothe throat irritation and ease nasal congestion. For fever or pain, acetaminophen  (Tylenol ) is recommended. To relieve other symptoms, try saline nasal sprays, throat lozenges, or gargling with saltwater. Focus on eating light, healthy meals like fruits and vegetables to keep your strength up. Practice good hygiene by washing your hands frequently and covering your mouth when coughing or sneezing.Follow-up for worsening or persistent symptoms. Patient verbalizes understanding regarding plan of care and all questions answered

## 2024-06-09 NOTE — Telephone Encounter (Signed)
 Visit completed.

## 2024-06-09 NOTE — Progress Notes (Signed)
 Established Patient Office Visit   Subjective  Patient ID: Jimmy Hall, male    DOB: 1956-09-19  Age: 68 y.o. MRN: [id number]  Chief Complaint  Patient presents with   Sore Throat    Sore throat started 9/3, nausea and vomiting (last episode was yesterday) feels nauseated now. Cough for a couple days but got worse lastnight. Mucus is clear. Grandchild recently had strep    He  has a past medical history of Chronic back pain, History of bronchitis, History of gout, Hyperlipidemia, Joint pain, Numbness, and Seasonal allergies.  Patient presents for evaluation of cough, reporting associated symptoms of fatigue, headache, malaise, nausea, sore throat, sputum production, vomiting, and wheezing. Symptoms began 2 days ago and have been gradually worsening. The patient denies chest pain. Treatment so far has included over-the-counter analgesics/antipyretics and an antitussive , both of which have provided some relief. Past pulmonary history is significant for no prior pneumonia or bronchitis.    Review of Systems  Constitutional:  Negative for chills and fever.  HENT:  Positive for congestion and sore throat.   Respiratory:  Positive for cough and shortness of breath.   Cardiovascular:  Negative for chest pain.  Neurological:  Positive for headaches. Negative for dizziness.      Objective:     BP 122/65   Pulse 66   Temp 98.4 F (36.9 C) (Oral)   Resp 16   Ht 5' 10 (1.778 m)   Wt 239 lb (108.4 kg)   SpO2 94%   BMI 34.29 kg/m  BP Readings from Last 3 Encounters:  06/09/24 122/65  06/07/24 118/82  06/06/24 118/82      Physical Exam Vitals reviewed.  Constitutional:      General: He is not in acute distress.    Appearance: Normal appearance. He is not ill-appearing, toxic-appearing or diaphoretic.  HENT:     Head: Normocephalic.     Nose: Congestion and rhinorrhea present.     Mouth/Throat:     Pharynx: Posterior oropharyngeal erythema present.  Eyes:     General:         Right eye: No discharge.        Left eye: No discharge.     Conjunctiva/sclera: Conjunctivae normal.  Cardiovascular:     Rate and Rhythm: Normal rate.     Pulses: Normal pulses.     Heart sounds: Normal heart sounds.  Pulmonary:     Effort: Pulmonary effort is normal. No respiratory distress.     Breath sounds: Normal breath sounds.  Skin:    General: Skin is warm and dry.     Capillary Refill: Capillary refill takes less than 2 seconds.  Neurological:     Mental Status: He is alert.  Psychiatric:        Mood and Affect: Mood normal.      No results found for any visits on 06/09/24.  The 10-year ASCVD risk score (Arnett DK, et al., 2019) is: 11.1%    Assessment & Plan:  Viral illness Assessment & Plan: COVID-19, Flu A+B RSV and Rapid Strep test ordered   Start Prednisone  starter pack Benzonatate  200 mg PRN, Lidocaine  viscous solution PRN, Advise patient to rest to support your body's recovery. Stay hydrated by drinking water, tea, or broth. Using a humidifier can help soothe throat irritation and ease nasal congestion. For fever or pain, acetaminophen  (Tylenol ) is recommended. To relieve other symptoms, try saline nasal sprays, throat lozenges, or gargling with saltwater. Focus on eating light, healthy  meals like fruits and vegetables to keep your strength up. Practice good hygiene by washing your hands frequently and covering your mouth when coughing or sneezing.Follow-up for worsening or persistent symptoms. Patient verbalizes understanding regarding plan of care and all questions answered   Orders: -     COVID-19, Flu A+B and RSV -     Rapid Strep Screen ([facility] ONLY); Future -     Lidocaine  Viscous HCl; Use as directed 15 mLs in the mouth or throat as needed.  Dispense: 100 mL; Refill: 0 -     predniSONE ; Day 1: 6 tablets (60 mg) Day 2: 5 tablets (50 mg) Day 3: 4 tablets (40 mg) Day 4: 3 tablets (30 mg) Day 5: 2 tablets (20 mg) Day 6: 1 tablet (10 mg)  Dispense:  21 tablet; Refill: 0 -     Benzonatate ; Take 1 capsule (200 mg total) by mouth 2 (two) times daily as needed.  Dispense: 20 capsule; Refill: 1    Return if symptoms worsen or fail to improve.   Hilario Kidd Wilhelmena Falter, FNP

## 2024-06-09 NOTE — Patient Instructions (Signed)

## 2024-06-11 LAB — COVID-19, FLU A+B AND RSV
Influenza A, NAA: NOT DETECTED
Influenza B, NAA: NOT DETECTED
RSV, NAA: NOT DETECTED
SARS-CoV-2, NAA: NOT DETECTED

## 2024-06-13 LAB — RAPID STREP SCREEN (MED CTR MEBANE ONLY): Strep Gp A Ag, IA W/Reflex: NEGATIVE

## 2024-06-13 LAB — CULTURE, GROUP A STREP

## 2024-06-14 ENCOUNTER — Ambulatory Visit: Payer: Self-pay

## 2024-06-14 ENCOUNTER — Telehealth: Payer: Self-pay

## 2024-06-14 NOTE — Telephone Encounter (Unsigned)
 Copied from CRM [id number]. Topic: Clinical - Lab/Test Results >> Jun 14, 2024  9:06 AM Tiffini S wrote: Reason for CRM: Patient called for the results for COVID-19, Flu A+B and RSV test ordered- advised patient that final results are not back from [facility]. Please call the patient once the results are available at [phone].

## 2024-06-27 ENCOUNTER — Other Ambulatory Visit: Payer: Self-pay

## 2024-06-27 ENCOUNTER — Telehealth: Payer: Self-pay

## 2024-06-27 NOTE — Patient Instructions (Signed)

## 2024-06-27 NOTE — Telephone Encounter (Signed)
 Copied from CRM #[id number]. Topic: General - Other >> Jun 27, 2024  9:16 AM Willma R wrote: Reason for CRM: Patient missed call from [facility] at 9. States he is currently available if they are able to call back.  Patient can be reached at [phone]

## 2024-06-27 NOTE — Patient Outreach (Signed)
 Complex Care Management   Visit Note  06/27/2024  Name:  Jimmy Hall MRN: [id number] DOB: 07-25-1956  Situation: Referral received for Complex Care Management related to SDOH Barriers:  Dental I obtained verbal consent from Patient.  Visit completed with Patient  on the phone  Background:   Past Medical History:  Diagnosis Date   Chronic back pain    spondylolisthesis   History of bronchitis    94yrs ago and only one time   History of gout    not on any meds   Hyperlipidemia    Joint pain    knees   Numbness    left 4th and 5th finger   Seasonal allergies    takes OTC meds maybe once a week    Assessment:  Patient has not connected with insurance on coverage for dental needs.  SW t/c [facility] and patient has $1500 toward dental and vision.  Currently his balance is $1151.  Patient has a Education officer, community he is interested in using.  [facility] does not have in-network requirements.  Staff provides instructions on the use of the [employer] to pay for treatment.   SDOH Interventions    Flowsheet Row Patient Outreach Telephone from 06/27/2024 in [place] POPULATION HEALTH DEPARTMENT Patient Outreach Telephone from 06/09/2024 in [place] POPULATION HEALTH DEPARTMENT Clinical Support from 06/07/2024 in [facility] Office Visit from 06/06/2024 in [facility] Office Visit from 12/17/2023 in [facility] Office Visit from 05/03/2023 in [facility]  SDOH Interventions        Food Insecurity Interventions Intervention Not Indicated -- AMB Referral, [employer] Referral -- -- --  Housing Interventions Intervention Not Indicated  [paid rent late due to other bills] -- AMB Referral, [employer] Referral -- -- --  Transportation Interventions Intervention Not Indicated  [Has a car] -- Intervention Not Indicated -- -- --  Utilities Interventions Other (Comment)   [Referred to DSS, bill is not due for disconnection] [employer] Provided AMB Referral, [employer] Referral -- -- --  Alcohol Usage Interventions -- -- Intervention Not Indicated (Score <7) -- -- --  Depression Interventions/Treatment  -- -- PHQ2-9 Score <4 Follow-up Not Indicated PHQ2-9 Score <4 Follow-up Not Indicated PHQ2-9 Score <4 Follow-up Not Indicated PHQ2-9 Score <4 Follow-up Not Indicated  Financial Strain Interventions Intervention Not Indicated -- [employer] Referral, Other (Comment)  [[facility] referral] -- -- --  Physical Activity Interventions -- -- Intervention Not Indicated -- -- --  Stress Interventions -- -- Intervention Not Indicated -- -- --  Social Connections Interventions -- -- Patient Declined -- -- --  Health Literacy Interventions -- -- Intervention Not Indicated -- -- --    Recommendation:   None  Follow Up Plan:   Patient has met all care management goals. Care Management case will be closed. Patient has been provided contact information should new needs arise.   Tillman Gardener, BSW [facility], [facility] Social Worker Direct Dial: [phone]  Fax: [phone] Website: [email or web address]

## 2024-07-03 DIAGNOSIS — E785 Hyperlipidemia, unspecified: Secondary | ICD-10-CM | POA: Diagnosis not present

## 2024-07-03 DIAGNOSIS — Z008 Encounter for other general examination: Secondary | ICD-10-CM | POA: Diagnosis not present

## 2024-07-03 DIAGNOSIS — J4520 Mild intermittent asthma, uncomplicated: Secondary | ICD-10-CM | POA: Diagnosis not present

## 2024-07-03 DIAGNOSIS — E669 Obesity, unspecified: Secondary | ICD-10-CM | POA: Diagnosis not present

## 2024-07-03 DIAGNOSIS — F17211 Nicotine dependence, cigarettes, in remission: Secondary | ICD-10-CM | POA: Diagnosis not present

## 2024-07-03 DIAGNOSIS — Z6834 Body mass index (BMI) 34.0-34.9, adult: Secondary | ICD-10-CM | POA: Diagnosis not present

## 2024-07-03 DIAGNOSIS — I10 Essential (primary) hypertension: Secondary | ICD-10-CM | POA: Diagnosis not present

## 2024-07-04 ENCOUNTER — Telehealth: Admitting: *Deleted

## 2024-07-07 ENCOUNTER — Telehealth: Payer: Self-pay

## 2024-07-07 NOTE — Patient Outreach (Signed)
 Complex Care Management Care Guide Note  07/07/2024 Name: Jimmy Hall MRN: [id number] DOB: [birth year]  Jimmy Hall is a 68 y.o. year old male who is a primary care patient of Huenink, Leita, FNP and is actively engaged with the care management team. I reached out to Jimmy Hall by phone today to assist with re-scheduling  with the BSW.  Follow up plan: Successful telephone outreach attempt made. Spoke with patient and was told he did not need to reschedule, informed BSW & RN.   Shereen Gin [facility]  Population Health VBCI Assistant Direct Dial: [phone]  Fax: [phone] Website: [email or web address]

## 2024-07-12 ENCOUNTER — Ambulatory Visit

## 2024-07-12 ENCOUNTER — Telehealth: Payer: Self-pay

## 2024-07-12 NOTE — Telephone Encounter (Signed)
 Pt stopped by stating he has not heard from dermatology referral that was placed over a month ago. Can this be checked?

## 2024-07-12 NOTE — Progress Notes (Deleted)
 Patient is in office today for a nurse visit for Immunization. Patient Injection was given in the  {Injection site:18846}. Patient tolerated injection well.

## 2024-07-13 NOTE — Telephone Encounter (Signed)
 Referral sent to [facility] due to Patient insurance.  MyChart Message sent to Patient with Specialty Office contact information.

## 2024-07-18 ENCOUNTER — Encounter: Payer: Self-pay | Admitting: *Deleted

## 2024-07-18 ENCOUNTER — Telehealth: Payer: Self-pay | Admitting: *Deleted

## 2024-07-18 NOTE — Patient Instructions (Signed)
 Jimmy Hall - I am sorry I was unable to reach you today for our scheduled appointment. I work with Bevely Doffing, FNP and am calling to support your healthcare needs. Please contact me at [phone] at your earliest convenience. I look forward to speaking with you soon.   Thank you,  Rosina Forte, BSN RN [facility], [facility] RN Care Manager Direct Dial: [phone]  Fax: [phone]

## 2024-07-25 ENCOUNTER — Telehealth: Payer: Self-pay | Admitting: *Deleted

## 2024-07-25 NOTE — Progress Notes (Signed)
 Complex Care Management Care Guide Note  07/25/2024 Name: Jimmy Hall MRN: [id number] DOB: 1956/05/15  Jimmy Hall is a 68 y.o. year old male who is a primary care patient of Huenink, Leita, FNP and is actively engaged with the care management team. I reached out to Jimmy Hall by phone today to assist with re-scheduling  with the RN Case Manager.  Follow up plan: Successful telephone outreach attempt made. At this time patient does not have any concerns and declines to reschedule with RNCM. No additional calls will be made at this time.  Harlene Satterfield  [facility]  [facility] Guide  Direct Dial: [phone]  Fax [phone]

## 2024-09-05 ENCOUNTER — Ambulatory Visit

## 2024-09-05 DIAGNOSIS — Z23 Encounter for immunization: Secondary | ICD-10-CM

## 2024-11-02 ENCOUNTER — Ambulatory Visit

## 2025-02-14 ENCOUNTER — Ambulatory Visit: Admitting: Physician Assistant

## 2025-06-13 ENCOUNTER — Ambulatory Visit

## 2025-06-15 ENCOUNTER — Ambulatory Visit
